# Patient Record
Sex: Male | Born: 1955 | Race: White | Hispanic: No | Marital: Married | Smoking: Current every day smoker
Health system: Southern US, Community
[De-identification: ages and names within clinical notes are randomized; demographics above are authoritative.]

## PROBLEM LIST (undated history)

## (undated) DIAGNOSIS — C801 Malignant (primary) neoplasm, unspecified: Secondary | ICD-10-CM

## (undated) DIAGNOSIS — Z22322 Carrier or suspected carrier of Methicillin resistant Staphylococcus aureus: Secondary | ICD-10-CM

## (undated) DIAGNOSIS — Z973 Presence of spectacles and contact lenses: Secondary | ICD-10-CM

## (undated) DIAGNOSIS — D729 Disorder of white blood cells, unspecified: Secondary | ICD-10-CM

## (undated) DIAGNOSIS — K5090 Crohn's disease, unspecified, without complications: Secondary | ICD-10-CM

## (undated) DIAGNOSIS — B029 Zoster without complications: Secondary | ICD-10-CM

## (undated) DIAGNOSIS — I10 Essential (primary) hypertension: Secondary | ICD-10-CM

## (undated) DIAGNOSIS — K219 Gastro-esophageal reflux disease without esophagitis: Secondary | ICD-10-CM

## (undated) HISTORY — PX: ABDOMINAL SURGERY: SHX537

## (undated) HISTORY — PX: HYDROCELE EXCISION / REPAIR: SUR1145

## (undated) HISTORY — PX: BOWEL RESECTION: SHX1257

## (undated) HISTORY — DX: Disorder of white blood cells, unspecified: D72.9

---

## 2007-04-21 ENCOUNTER — Ambulatory Visit: Payer: Self-pay | Admitting: Gastroenterology

## 2007-09-20 ENCOUNTER — Ambulatory Visit: Payer: Self-pay | Admitting: Family Medicine

## 2007-09-23 ENCOUNTER — Inpatient Hospital Stay: Payer: Self-pay | Admitting: General Surgery

## 2007-10-21 ENCOUNTER — Ambulatory Visit: Payer: Self-pay | Admitting: Unknown Physician Specialty

## 2007-11-02 ENCOUNTER — Ambulatory Visit: Payer: Self-pay | Admitting: Family

## 2007-11-10 ENCOUNTER — Ambulatory Visit: Payer: Self-pay | Admitting: Unknown Physician Specialty

## 2007-12-14 ENCOUNTER — Ambulatory Visit: Payer: Self-pay | Admitting: Unknown Physician Specialty

## 2008-09-20 ENCOUNTER — Ambulatory Visit: Payer: Self-pay | Admitting: Internal Medicine

## 2009-09-23 ENCOUNTER — Ambulatory Visit: Payer: Self-pay | Admitting: Gastroenterology

## 2009-10-02 ENCOUNTER — Inpatient Hospital Stay: Payer: Self-pay | Admitting: Internal Medicine

## 2009-10-14 ENCOUNTER — Inpatient Hospital Stay: Payer: Self-pay | Admitting: Unknown Physician Specialty

## 2009-12-03 ENCOUNTER — Ambulatory Visit: Payer: Self-pay | Admitting: Unknown Physician Specialty

## 2010-01-03 ENCOUNTER — Other Ambulatory Visit: Payer: Self-pay | Admitting: Unknown Physician Specialty

## 2010-04-06 IMAGING — CR DG ABDOMEN 2V
1 series · 3 of 3 positions shown · non-contrast
Comparison: none

REASON FOR EXAM: gastric distention
COMMENTS:

[Series 1: view not recorded · 0.17mm/px · 3 of 3 slices shown]
[im 1/3]
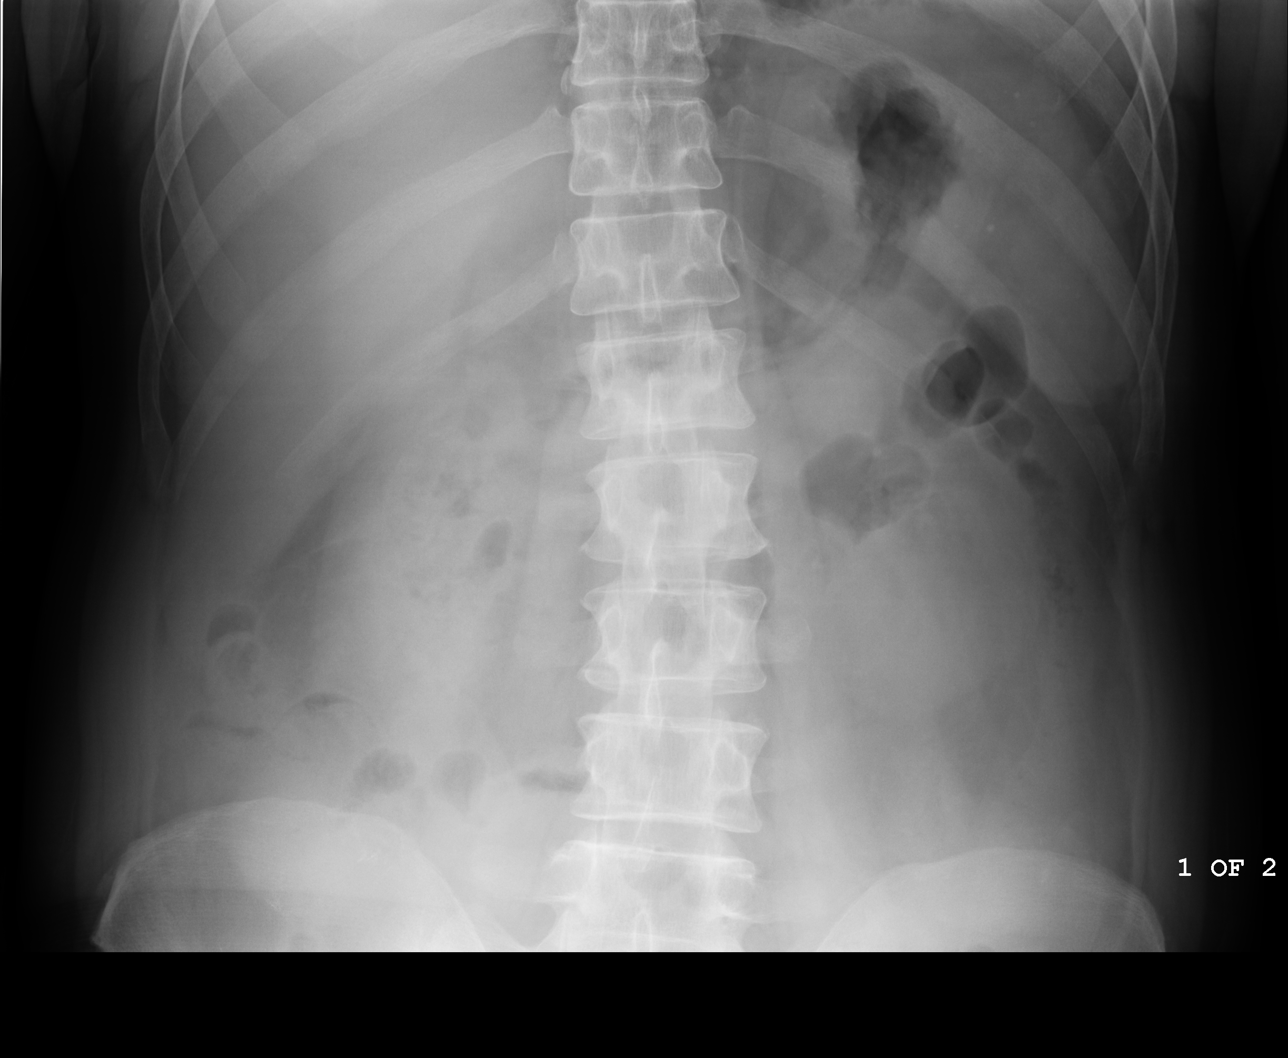
[im 2/3]
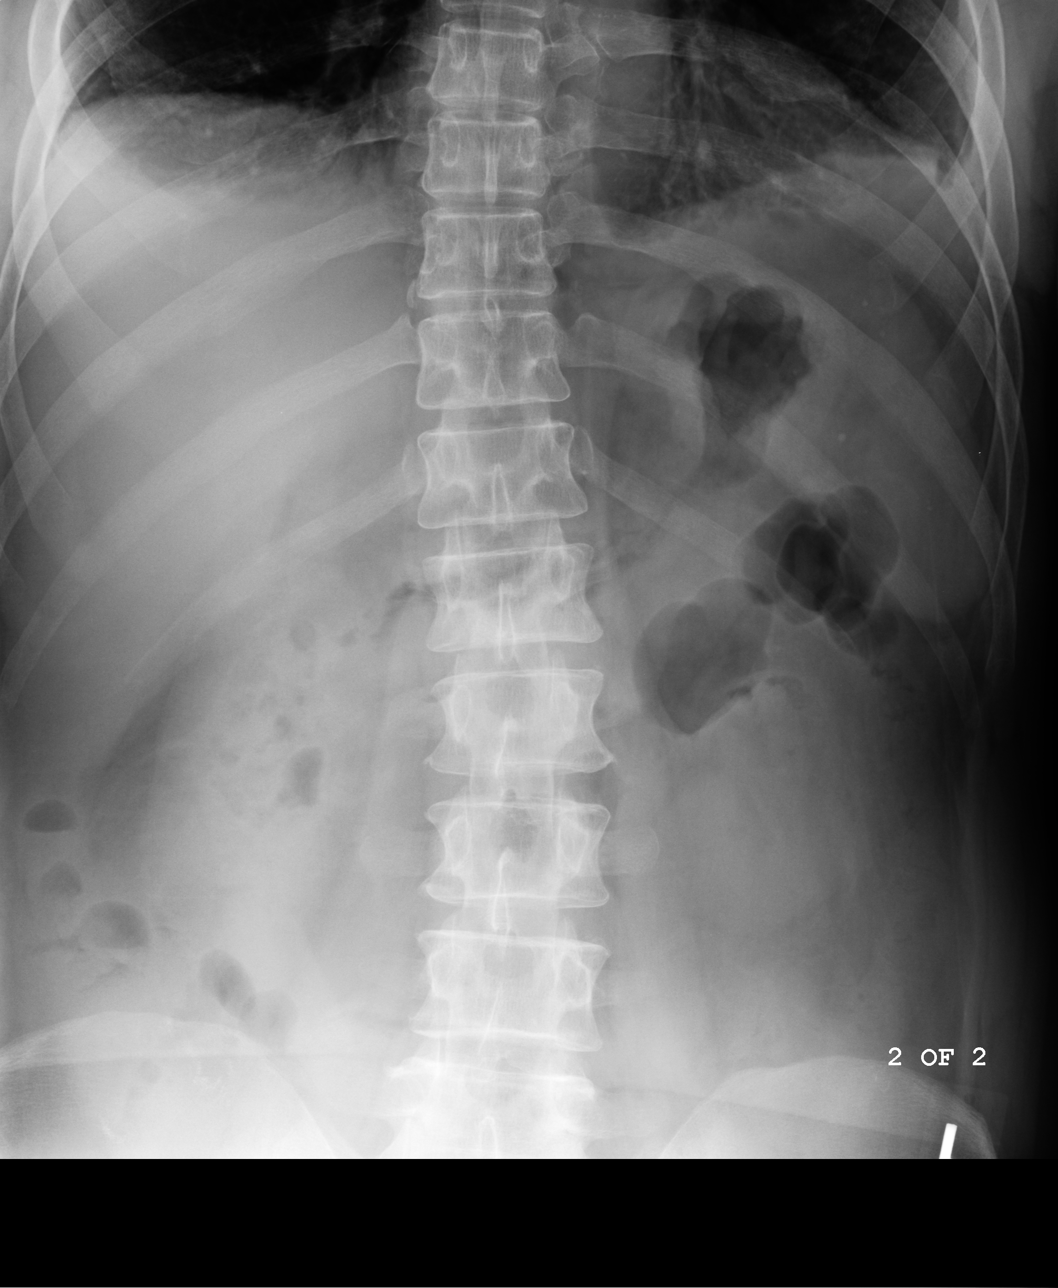
[im 3/3]
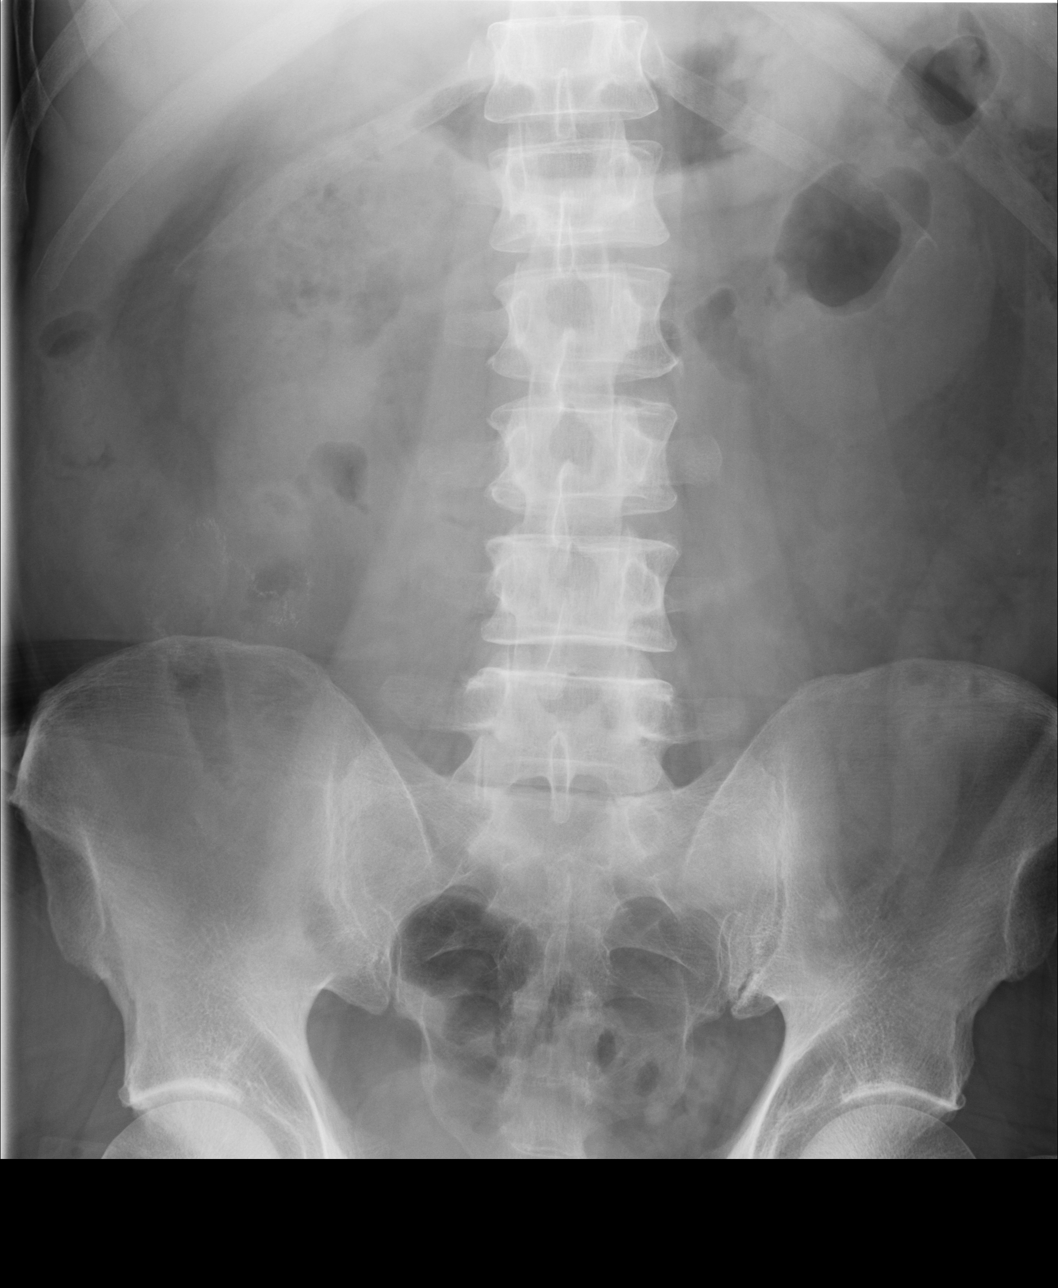

[3 of 3 positions shown; findings below may reference images not displayed]

PROCEDURE:     DXR - DXR ABDOMEN 2 V FLAT AND ERECT  - September 27, 2007 [DATE]

RESULT:     Three views of the abdomen are submitted. The bowel gas pattern
is relatively nonspecific. I see no evidence of obstruction nor of ileus.
Minimal blunting of the LEFT lateral costophrenic angle is seen. No abnormal
soft tissue calcifications over the abdomen are demonstrated. There is
likely a phlebolith in the LEFT aspect of the pelvis. The bony structures
are normal in appearance. The stomach does not appear distended on this
study.
IMPRESSION: 1.The bowel gas pattern is nonspecific. There is no evidence of ileus or
obstruction. The nasogastric tube has been withdrawn.
2. I see no acute abnormality elsewhere within the abdomen. Follow-up
imaging is available if the patient's symptoms warrant this.

## 2010-05-27 ENCOUNTER — Ambulatory Visit: Payer: Self-pay | Admitting: Internal Medicine

## 2011-11-07 ENCOUNTER — Other Ambulatory Visit: Payer: Self-pay

## 2011-12-11 ENCOUNTER — Ambulatory Visit: Payer: Self-pay

## 2012-02-02 ENCOUNTER — Ambulatory Visit: Payer: Self-pay | Admitting: Unknown Physician Specialty

## 2012-12-06 ENCOUNTER — Ambulatory Visit: Payer: Self-pay | Admitting: Unknown Physician Specialty

## 2014-09-18 ENCOUNTER — Other Ambulatory Visit
Admission: RE | Admit: 2014-09-18 | Discharge: 2014-09-18 | Disposition: A | Discharge status: Home / Self Care | Payer: Medicare Other | Attending: Unknown Physician Specialty | Admitting: Unknown Physician Specialty

## 2014-09-18 DIAGNOSIS — R197 Diarrhea, unspecified: Secondary | ICD-10-CM | POA: Insufficient documentation

## 2014-09-18 LAB — C DIFFICILE QUICK SCREEN W PCR REFLEX
C DIFFICILE (CDIFF) TOXIN: NEGATIVE
C Diff antigen: NEGATIVE
C Diff interpretation: NEGATIVE

## 2015-08-30 DIAGNOSIS — K50919 Crohn's disease, unspecified, with unspecified complications: Secondary | ICD-10-CM | POA: Insufficient documentation

## 2016-06-26 ENCOUNTER — Ambulatory Visit
Admission: EM | Admit: 2016-06-26 | Discharge: 2016-06-26 | Disposition: A | Discharge status: Home / Self Care | Payer: Medicare Other | Attending: Family Medicine | Admitting: Family Medicine

## 2016-06-26 ENCOUNTER — Encounter: Payer: Self-pay | Admitting: *Deleted

## 2016-06-26 DIAGNOSIS — J219 Acute bronchiolitis, unspecified: Secondary | ICD-10-CM | POA: Diagnosis not present

## 2016-06-26 DIAGNOSIS — J019 Acute sinusitis, unspecified: Secondary | ICD-10-CM

## 2016-06-26 MED ORDER — CEFUROXIME AXETIL 500 MG PO TABS: 500.0000 mg | ORAL_TABLET | Freq: Two times a day (BID) | ORAL | 0 refills | Status: DC

## 2016-06-26 MED ORDER — PREDNISONE 10 MG (21) PO TBPK: ORAL_TABLET | ORAL | 0 refills | Status: DC

## 2016-06-26 NOTE — ED Triage Notes (Signed)
Seen on Monday by PCP for URI and tx with azithromycin. Now c/o facial pain, headache, nasal drainage, and productive cough- yellow. Is not improving on present meds and PCP is out of town.

## 2016-06-26 NOTE — ED Provider Notes (Signed)
[facility]    CSN: [id number] Arrival date & time: 06/26/16  0900     History   Chief Complaint Chief Complaint  Patient presents with  . Otalgia  . Headache  . Facial Pain  . Cough    HPI MAHMUD KEITHLY is a 61 y.o. male.   Is a 61 year old white male who presents today with a history of nasal congestion sore throat fever and aches last week Thursday. He states wife earlier that week had sore sounds are seen like the flu. He then picked the illness. He states by Saturday and Sunday achiness was improving however he developed a deep cough chest congestion. He saw Dr. Clemmie Krill his PCP on Monday he was negative for the flu at that point but they started him on a Z-Pak for his bronchitis. The tone that they thought he had a secondary infection. Turns out that before he was sick last week he began having sinus congestion and sinus problem for about 2 weeks previously. He states that has gotten a lot worse with pressure on the right side of his face and forehead as well. He's had increased coughing and increased congestion also he has a history of Crohn's disease he takes immune modulator for that. He does smoke and of course his been to 6 smoke as usual amount but tried to explain to him we'll not congratulate him on those points at this time. He is allergic to aspirin. No pertinent past family medical history relevant to today's visit. Surgery he's had abdominal surgery for the past.   The history is provided by the patient. No language interpreter was used.  Otalgia  Location:  Right Quality:  Aching Severity:  Moderate Onset quality:  Sudden Timing:  Constant Chronicity:  New Context: recent URI   Relieved by:  Nothing (zithromax) Worsened by:  Nothing Associated symptoms: cough and headaches   Headache  Associated symptoms: cough, ear pain and sinus pressure   Cough  Associated symptoms: ear pain and headaches     History reviewed. No pertinent past medical  history.  There are no active problems to display for this patient.   Past Surgical History:  Procedure Laterality Date  . ABDOMINAL SURGERY         Home Medications    Prior to Admission medications   Medication Sig Start Date End Date Taking? Authorizing Provider  Adalimumab (HUMIRA PEN [place]) Inject into the skin.   Yes Historical Provider, MD  cefUROXime (CEFTIN) 500 MG tablet Take 1 tablet (500 mg total) by mouth 2 (two) times daily. 06/26/16   Frederich Cha, MD  predniSONE (STERAPRED UNI-PAK 21 TAB) 10 MG (21) TBPK tablet Sig 6 tablet day 1, 5 tablets day 2, 4 tablets day 3,,3tablets day 4, 2 tablets day 5, 1 tablet day 6 take all tablets orally 06/26/16   Frederich Cha, MD    Family History History reviewed. No pertinent family history.  Social History Social History  Substance Use Topics  . Smoking status: Current Every Day Smoker  . Smokeless tobacco: Never Used  . Alcohol use No     Allergies   Aspirin   Review of Systems Review of Systems  HENT: Positive for ear pain, facial swelling, sinus pain and sinus pressure.   Respiratory: Positive for cough.   Neurological: Positive for headaches.  All other systems reviewed and are negative.    Physical Exam Triage Vital Signs ED Triage Vitals  Enc Vitals Group  BP 06/26/16 0935 (!) 136/91     Pulse Rate 06/26/16 0935 88     Resp 06/26/16 0935 16     Temp 06/26/16 0935 98.4 F (36.9 C)     Temp Source 06/26/16 0935 Oral     SpO2 06/26/16 0935 99 %     Weight 06/26/16 0937 179 lb (81.2 kg)     Height 06/26/16 0937 5' 11"  (1.803 m)     Head Circumference --      Peak Flow --      Pain Score --      Pain Loc --      Pain Edu? --      Excl. in [place]? --    No data found.   Updated Vital Signs BP (!) 136/91 (BP Location: Left Arm)   Pulse 88   Temp 98.4 F (36.9 C) (Oral)   Resp 16   Ht 5' 11"  (1.803 m)   Wt 179 lb (81.2 kg)   SpO2 99%   BMI 24.97 kg/m   Visual Acuity Right Eye Distance:   Left  Eye Distance:   Bilateral Distance:    Right Eye Near:   Left Eye Near:    Bilateral Near:     Physical Exam  Constitutional: He appears well-developed and well-nourished.  HENT:  Head: Normocephalic and atraumatic.  Right Ear: Hearing, tympanic membrane, external ear and ear canal normal.  Left Ear: Hearing, tympanic membrane, external ear and ear canal normal.  Nose: Mucosal edema and rhinorrhea present. Right sinus exhibits maxillary sinus tenderness and frontal sinus tenderness.    Mouth/Throat: Uvula is midline, oropharynx is clear and moist and mucous membranes are normal.  Eyes: Conjunctivae are normal. Pupils are equal, round, and reactive to light.  Neck: Normal range of motion. Neck supple.  Cardiovascular: Normal rate and regular rhythm.   Pulmonary/Chest: Effort normal and breath sounds normal.  Musculoskeletal: Normal range of motion.  Neurological: He is alert.  Skin: Skin is warm.  Vitals reviewed.    UC Treatments / Results  Labs (all labs ordered are listed, but only abnormal results are displayed) Labs Reviewed - No data to display  EKG  EKG Interpretation None       Radiology No results found.  Procedures Procedures (including critical care time)  Medications Ordered in UC Medications - No data to display   Initial Impression / Assessment and Plan / UC Course  I have reviewed the triage vital signs and the nursing notes.  Pertinent labs & imaging results that were available during my care of the patient were reviewed by me and considered in my medical decision making (see chart for details).    Patient that his 62-year-old white male who has or is taking immune modulating drugs and is not giving himself his injection because he is on antibiotics. Who apparently had nasal congestion for about 1 or 2 weeks before it sounds like he was exposed and developed flu. He developed acute bronchitis top of the flu. Zithromax seemed to help that. Does not  help the sinus infection nothing was there before he had the flu. We'll place him on Ceftin 500 mg 1 tablet twice a day course of prednisone and hopefully this will make things better. Follow-up Dr. Clemmie Krill next week if not better.  Final Clinical Impressions(s) / UC Diagnoses   Final diagnoses:  Acute non-recurrent sinusitis, unspecified location  Acute bronchiolitis due to unspecified organism    New Prescriptions Discharge Medication List as  of 06/26/2016 10:33 AM    START taking these medications   Details  cefUROXime (CEFTIN) 500 MG tablet Take 1 tablet (500 mg total) by mouth 2 (two) times daily., Starting Thu 06/26/2016, Normal    predniSONE (STERAPRED UNI-PAK 21 TAB) 10 MG (21) TBPK tablet Sig 6 tablet day 1, 5 tablets day 2, 4 tablets day 3,,3tablets day 4, 2 tablets day 5, 1 tablet day 6 take all tablets orally, Normal         Note: This dictation was prepared with Dragon dictation along with smaller phrase technology. Any transcriptional errors that result from this process are unintentional.   Frederich Cha, MD 06/26/16 1122

## 2016-06-30 ENCOUNTER — Inpatient Hospital Stay
Admission: EM | Admit: 2016-06-30 | Discharge: 2016-07-02 | DRG: 153 | Disposition: A | Discharge status: Home / Self Care | Payer: Medicare Other | Attending: Specialist | Admitting: Specialist

## 2016-06-30 ENCOUNTER — Emergency Department: Payer: Medicare Other

## 2016-06-30 ENCOUNTER — Encounter: Payer: Self-pay | Admitting: Emergency Medicine

## 2016-06-30 DIAGNOSIS — B9689 Other specified bacterial agents as the cause of diseases classified elsewhere: Secondary | ICD-10-CM | POA: Diagnosis present

## 2016-06-30 DIAGNOSIS — K5090 Crohn's disease, unspecified, without complications: Secondary | ICD-10-CM | POA: Diagnosis present

## 2016-06-30 DIAGNOSIS — Z886 Allergy status to analgesic agent status: Secondary | ICD-10-CM

## 2016-06-30 DIAGNOSIS — H578 Other specified disorders of eye and adnexa: Secondary | ICD-10-CM | POA: Diagnosis present

## 2016-06-30 DIAGNOSIS — D72829 Elevated white blood cell count, unspecified: Secondary | ICD-10-CM

## 2016-06-30 DIAGNOSIS — Z85828 Personal history of other malignant neoplasm of skin: Secondary | ICD-10-CM

## 2016-06-30 DIAGNOSIS — Z79899 Other long term (current) drug therapy: Secondary | ICD-10-CM | POA: Diagnosis not present

## 2016-06-30 DIAGNOSIS — B370 Candidal stomatitis: Secondary | ICD-10-CM | POA: Diagnosis present

## 2016-06-30 DIAGNOSIS — J019 Acute sinusitis, unspecified: Secondary | ICD-10-CM | POA: Diagnosis present

## 2016-06-30 DIAGNOSIS — I10 Essential (primary) hypertension: Secondary | ICD-10-CM | POA: Diagnosis present

## 2016-06-30 DIAGNOSIS — F17210 Nicotine dependence, cigarettes, uncomplicated: Secondary | ICD-10-CM | POA: Diagnosis present

## 2016-06-30 DIAGNOSIS — H5789 Other specified disorders of eye and adnexa: Secondary | ICD-10-CM | POA: Diagnosis present

## 2016-06-30 HISTORY — DX: Essential (primary) hypertension: I10

## 2016-06-30 HISTORY — DX: Malignant (primary) neoplasm, unspecified: C80.1

## 2016-06-30 HISTORY — DX: Crohn's disease, unspecified, without complications: K50.90

## 2016-06-30 LAB — CBC WITH DIFFERENTIAL/PLATELET
BASOS PCT: 1 %
Basophils Absolute: 0.2 10*3/uL — ABNORMAL HIGH (ref 0–0.1)
EOS PCT: 1 %
Eosinophils Absolute: 0.2 10*3/uL (ref 0–0.7)
HCT: 49.1 % (ref 40.0–52.0)
HEMOGLOBIN: 17.1 g/dL (ref 13.0–18.0)
Lymphocytes Relative: 10 %
Lymphs Abs: 2 10*3/uL (ref 1.0–3.6)
MCH: 30.7 pg (ref 26.0–34.0)
MCHC: 34.7 g/dL (ref 32.0–36.0)
MCV: 88.4 fL (ref 80.0–100.0)
Monocytes Absolute: 3.2 10*3/uL — ABNORMAL HIGH (ref 0.2–1.0)
Monocytes Relative: 16 %
NEUTROS PCT: 72 %
Neutro Abs: 14.2 10*3/uL — ABNORMAL HIGH (ref 1.4–6.5)
Platelets: 365 10*3/uL (ref 150–440)
RBC: 5.56 MIL/uL (ref 4.40–5.90)
RDW: 13.5 % (ref 11.5–14.5)
WBC: 19.8 10*3/uL — ABNORMAL HIGH (ref 3.8–10.6)

## 2016-06-30 LAB — BASIC METABOLIC PANEL
Anion gap: 9 (ref 5–15)
BUN: 11 mg/dL (ref 6–20)
CHLORIDE: 101 mmol/L (ref 101–111)
CO2: 28 mmol/L (ref 22–32)
Calcium: 9.1 mg/dL (ref 8.9–10.3)
Creatinine, Ser: 0.8 mg/dL (ref 0.61–1.24)
GFR calc non Af Amer: >60 mL/min (ref >60)
Glucose, Bld: 109 mg/dL — ABNORMAL HIGH (ref 65–99)
Potassium: 4 mmol/L (ref 3.5–5.1)
Sodium: 138 mmol/L (ref 135–145)

## 2016-06-30 MED ORDER — TRAMADOL HCL 50 MG PO TABS
50.0000 mg | ORAL_TABLET | Freq: Four times a day (QID) | ORAL | Status: DC | PRN
  Administered 2016-06-30: 50 mg via ORAL
  Filled 2016-06-30: qty 1

## 2016-06-30 MED ORDER — SODIUM CHLORIDE 0.9 % IV SOLN
Freq: Once | INTRAVENOUS | Status: AC
  Administered 2016-06-30: 19:00:00 via INTRAVENOUS

## 2016-06-30 MED ORDER — BISACODYL 5 MG PO TBEC: 5.0000 mg | DELAYED_RELEASE_TABLET | Freq: Every day | ORAL | Status: DC | PRN

## 2016-06-30 MED ORDER — KETOROLAC TROMETHAMINE 30 MG/ML IJ SOLN
30.0000 mg | Freq: Once | INTRAMUSCULAR | Status: AC
  Administered 2016-06-30: 30 mg via INTRAVENOUS
  Filled 2016-06-30: qty 1

## 2016-06-30 MED ORDER — CLINDAMYCIN PHOSPHATE 600 MG/50ML IV SOLN
600.0000 mg | Freq: Three times a day (TID) | INTRAVENOUS | Status: DC
  Administered 2016-06-30 – 2016-07-01 (×3): 600 mg via INTRAVENOUS
  Filled 2016-06-30 (×6): qty 50

## 2016-06-30 MED ORDER — PROMETHAZINE HCL 25 MG PO TABS
25.0000 mg | ORAL_TABLET | Freq: Four times a day (QID) | ORAL | Status: DC | PRN
  Administered 2016-07-01: 25 mg via ORAL
  Filled 2016-06-30: qty 1

## 2016-06-30 MED ORDER — MAGIC MOUTHWASH
5.0000 mL | Freq: Four times a day (QID) | ORAL | Status: DC
  Administered 2016-06-30 – 2016-07-02 (×5): 5 mL via ORAL
  Filled 2016-06-30 (×5): qty 10

## 2016-06-30 MED ORDER — ACETAMINOPHEN 325 MG PO TABS: 650.0000 mg | ORAL_TABLET | Freq: Four times a day (QID) | ORAL | Status: DC | PRN

## 2016-06-30 MED ORDER — LISINOPRIL 10 MG PO TABS
10.0000 mg | ORAL_TABLET | Freq: Every day | ORAL | Status: DC
  Administered 2016-07-01 – 2016-07-02 (×2): 10 mg via ORAL
  Filled 2016-06-30 (×2): qty 1

## 2016-06-30 MED ORDER — ACETAMINOPHEN 650 MG RE SUPP: 650.0000 mg | Freq: Four times a day (QID) | RECTAL | Status: DC | PRN

## 2016-06-30 MED ORDER — VITAMIN D (ERGOCALCIFEROL) 1.25 MG (50000 UNIT) PO CAPS: 50000.0000 [IU] | ORAL_CAPSULE | ORAL | Status: DC

## 2016-06-30 MED ORDER — AMLODIPINE BESYLATE 10 MG PO TABS
10.0000 mg | ORAL_TABLET | Freq: Every day | ORAL | Status: DC
Start: 2016-07-01 — End: 2016-07-02
  Administered 2016-07-01 – 2016-07-02 (×2): 10 mg via ORAL
  Filled 2016-06-30 (×2): qty 1

## 2016-06-30 MED ORDER — MAGIC MOUTHWASH W/LIDOCAINE
5.0000 mL | Freq: Four times a day (QID) | ORAL | Status: DC
  Administered 2016-06-30: 5 mL via ORAL
  Filled 2016-06-30 (×3): qty 5

## 2016-06-30 MED ORDER — SENNOSIDES-DOCUSATE SODIUM 8.6-50 MG PO TABS: 1.0000 | ORAL_TABLET | Freq: Every evening | ORAL | Status: DC | PRN

## 2016-06-30 MED ORDER — ENOXAPARIN SODIUM 40 MG/0.4ML ~~LOC~~ SOLN
40.0000 mg | SUBCUTANEOUS | Status: DC
  Administered 2016-06-30 – 2016-07-01 (×2): 40 mg via SUBCUTANEOUS
  Filled 2016-06-30 (×2): qty 0.4

## 2016-06-30 MED ORDER — SODIUM CHLORIDE 0.9 % IV BOLUS (SEPSIS)
1000.0000 mL | Freq: Once | INTRAVENOUS | Status: AC
  Administered 2016-06-30: 1000 mL via INTRAVENOUS

## 2016-06-30 MED ORDER — ALBUTEROL SULFATE (2.5 MG/3ML) 0.083% IN NEBU: 2.5000 mg | INHALATION_SOLUTION | Freq: Four times a day (QID) | RESPIRATORY_TRACT | Status: DC | PRN

## 2016-06-30 MED ORDER — LORATADINE 10 MG PO TABS: 10.0000 mg | ORAL_TABLET | Freq: Every day | ORAL | Status: DC | PRN

## 2016-06-30 MED ORDER — HYOSCYAMINE SULFATE 0.125 MG PO TABS
0.1250 mg | ORAL_TABLET | ORAL | Status: DC | PRN
  Filled 2016-06-30: qty 1

## 2016-06-30 MED ORDER — IOPAMIDOL (ISOVUE-300) INJECTION 61%
75.0000 mL | Freq: Once | INTRAVENOUS | Status: AC | PRN
  Administered 2016-06-30: 75 mL via INTRAVENOUS
  Filled 2016-06-30: qty 75

## 2016-06-30 MED ORDER — CLINDAMYCIN PHOSPHATE 600 MG/50ML IV SOLN
600.0000 mg | Freq: Once | INTRAVENOUS | Status: AC
  Administered 2016-06-30: 600 mg via INTRAVENOUS
  Filled 2016-06-30: qty 50

## 2016-06-30 NOTE — ED Notes (Addendum)
See triage note  States he was seen last Thursday at [facility] urgent care and placed on ceftin and pred taper for sinus problems  Right eye and right side of face swollen  Per family swelling is worse today was sent in by PCP

## 2016-06-30 NOTE — ED Provider Notes (Signed)
[facility] Emergency Department Provider Note ____________________________________________  Time seen: 1315  I have reviewed the triage vital signs and the nursing notes.  HISTORY  Chief Complaint   Eye Drainage and Facial Pain  HPI Brent Diaz is a 61 y.o. male presents to the ED accompanied by his wife, for evaluation of worsening symptoms associated with acute sinusitis. The patient presents directly from his PCPs office for evaluation. Dr. Clemmie Krill evaluated the patient this morning for his complaint of continued right sided sinus and nasal congestion. He describes increased right-sided nasal pressure/congestion, mouth ulcers, and swelling around the right eyelid. He denies any interim fevers, chills, or sweats. He denies any eye pain, vision change, or vertigo. Patient is currently on a prednisone taper as well as Ceftin which was prescribed 4 days prior by Dr. Alveta Heimlich. Prior to that the patient had completed a Z-pack given by Dr. Clemmie Krill for suspected post-viral bronchitis, from last Monday. He reported to Dr. Alveta Heimlich that the sinus symptoms had been persistent for 2 weeks prior to his eval.   Past Medical History:  Diagnosis Date  . Cancer ([place])    skin  . Hypertension     There are no active problems to display for this patient.   Past Surgical History:  Procedure Laterality Date  . ABDOMINAL SURGERY      Prior to Admission medications   Medication Sig Start Date End Date Taking? Authorizing Provider  Adalimumab (HUMIRA PEN [place]) Inject into the skin.    Historical Provider, MD  cefUROXime (CEFTIN) 500 MG tablet Take 1 tablet (500 mg total) by mouth 2 (two) times daily. 06/26/16   Frederich Cha, MD  predniSONE (STERAPRED UNI-PAK 21 TAB) 10 MG (21) TBPK tablet Sig 6 tablet day 1, 5 tablets day 2, 4 tablets day 3,,3tablets day 4, 2 tablets day 5, 1 tablet day 6 take all tablets orally 06/26/16   Frederich Cha, MD    Allergies Aspirin  No family history on  file.  Social History Social History  Substance Use Topics  . Smoking status: Current Every Day Smoker    Packs/day: 0.50    Types: Cigarettes  . Smokeless tobacco: Never Used  . Alcohol use No    Review of Systems  Constitutional: Negative for fever. Eyes: Negative for visual changes. ENT: Negative for sore throat. Cardiovascular: Negative for chest pain. Respiratory: Negative for shortness of breath. Gastrointestinal: Negative for abdominal pain, vomiting and diarrhea. Genitourinary: Negative for dysuria. Musculoskeletal: Negative for back pain. Skin: Negative for rash. Neurological: Negative for headaches, focal weakness or numbness. ____________________________________________  PHYSICAL EXAM:  VITAL SIGNS: ED Triage Vitals  Enc Vitals Group     BP 06/30/16 1241 (!) 148/91     Pulse Rate 06/30/16 1241 86     Resp 06/30/16 1241 20     Temp 06/30/16 1241 97.8 F (36.6 C)     Temp Source 06/30/16 1241 Oral     SpO2 06/30/16 1241 98 %     Weight 06/30/16 1242 172 lb (78 kg)     Height 06/30/16 1242 5' 10"  (1.778 m)     Head Circumference --      Peak Flow --      Pain Score 06/30/16 1242 10     Pain Loc --      Pain Edu? --      Excl. in [place]? --     Constitutional: Alert and oriented. Well appearing and in no distress. Head: Normocephalic and atraumatic.  Eyes: Conjunctivae are normal. PERRL. Normal extraocular movements. Right periorbital swelling and mucopurulent eye drainage. Upper lid blepharitis noted. Minimally tender to palp over the infraorbital region at inferior orbital rim. Ears: Canals clear. TMs intact bilaterally. Nose: No congestion/rhinorrhea/epistaxis. Right-sided sinus congestion and pain Mouth/Throat: Mucous membranes are moist. Neck: Supple. No thyromegaly. Hematological/Lymphatic/Immunological: No cervical lymphadenopathy. Cardiovascular: Normal rate, regular rhythm. Normal distal pulses. Respiratory: Normal respiratory effort. No  wheezes/rales/rhonchi. Musculoskeletal: Nontender with normal range of motion in all extremities.  Neurologic:  Normal gait without ataxia. Normal speech and language. No gross focal neurologic deficits are appreciated. Skin:  Skin is warm, dry and intact. No rash noted. ____________________________________________   LABS (pertinent positives/negatives) Labs Reviewed  CBC WITH DIFFERENTIAL/PLATELET - Abnormal; Notable for the following:       Result Value   WBC 19.8 (*)    Neutro Abs 14.2 (*)    Monocytes Absolute 3.2 (*)    Basophils Absolute 0.2 (*)    All other components within normal limits  BASIC METABOLIC PANEL - Abnormal; Notable for the following:    Glucose, Bld 109 (*)    All other components within normal limits  ____________________________________________   RADIOLOGY  CT Maxillofacial w/ CM  IMPRESSION: Right periorbital soft tissue swelling.  No underlying abscess.  No acute bone abnormality.  No sinus disease. ____________________________________________  PROCEDURES  NS 1000 ml IV bolus Toradol 30 mg IVP Clindamycin 600 mg IVPB ____________________________________________  INITIAL IMPRESSION / ASSESSMENT AND PLAN / ED COURSE  ----------------------------------------- 3:39 PM on 06/30/2016 -----------------------------------------  Discussed case with my attending, Dr. Burlene Arnt. Patient with an acute bacterial sinusitis, elevated white count, and what appears to be filled outpatient antibiotic course. He agrees with plan to hang clindamycin and consult ENT for admission and inpatient management.   Patient and wife notified of plan of care, and agree with admission. Patient reports decreased pain at this time.   ----------------------------------------- 3:51 PM on 06/30/2016 -----------------------------------------  Page to Dr. Ayesha Mohair:  ----------------------------------------- 4:24 PM on  06/30/2016 -----------------------------------------  Hospitalist consulted for admission for inpatient IV antibiotic therapy. ____________________________________________  FINAL CLINICAL IMPRESSION(S) / ED DIAGNOSES  Final diagnoses:  Acute bacterial sinusitis      Melvenia Needles, PA-C 06/30/16 [place], PA-C 06/30/16 1624    Schuyler Amor, MD 06/30/16 1735

## 2016-06-30 NOTE — ED Triage Notes (Signed)
R periorbital swelling and drainage x 3 days. Sinus congestion x 1 week. Currently on antibiotic for sinus infection.

## 2016-06-30 NOTE — H&P (Signed)
[place] [facility] [place] NAME: Brent Diaz    MR#:  [id number]  DATE OF BIRTH:  18-Feb-1956  DATE OF ADMISSION:  06/30/2016  PRIMARY CARE PHYSICIAN: Pcp Not In System   REQUESTING/REFERRING PHYSICIAN: Dr Jimmye Norman  CHIEF COMPLAINT:   Right-sided eye pain with sinus congestion and ear ache for 10-12 days HISTORY OF PRESENT ILLNESS:  Brent Diaz  is a 61 y.o. male with a known history ofCrohn's disease, hypertension, history of skin cancer comes to the emergency room after patient started having increasing swelling around his eyes with watery eyes and sinus congestion with tightness on the right side of his face. Patient has significant headache and low-grade fever and found to have elevated white count of 19,000.  He was recently seen at urgent care and underwent a course of Z-Pak for cough congestion and cough. He was then put on prednisone taper 21 day pill along with Ceftin for sinusitis and right-sided pain around the cheek. Patient continued to have symptoms and now had swelling around his eye for last 3 days came to the emergency room for further evaluation management   CT maxillofacial showed right periorbital swelling   Patient received IV clindamycin is being admitted for further evaluation and management  PAST MEDICAL HISTORY:   Past Medical History:  Diagnosis Date  . Cancer ([place])    skin  . Hypertension     PAST SURGICAL HISTOIRY:   Past Surgical History:  Procedure Laterality Date  . ABDOMINAL SURGERY      SOCIAL HISTORY:   Social History  Substance Use Topics  . Smoking status: Current Every Day Smoker    Packs/day: 0.50    Types: Cigarettes  . Smokeless tobacco: Never Used  . Alcohol use No    FAMILY HISTORY:  No family history on file.  DRUG ALLERGIES:   Allergies  Allergen Reactions  . Aspirin Other (See Comments)    Peptic ulcers    REVIEW OF SYSTEMS:  Review of Systems  Constitutional:  Negative for chills, fever and weight loss.  HENT: Negative for ear discharge, ear pain and nosebleeds.   Eyes: Negative for blurred vision, pain and discharge.  Respiratory: Negative for sputum production, shortness of breath, wheezing and stridor.   Cardiovascular: Negative for chest pain, palpitations, orthopnea and PND.  Gastrointestinal: Negative for abdominal pain, diarrhea, nausea and vomiting.  Genitourinary: Negative for frequency and urgency.  Musculoskeletal: Negative for back pain and joint pain.  Neurological: Positive for weakness. Negative for sensory change, speech change and focal weakness.  Psychiatric/Behavioral: Negative for depression and hallucinations. The patient is not nervous/anxious.      MEDICATIONS AT HOME:   Prior to Admission medications   Medication Sig Start Date End Date Taking? Authorizing Provider  albuterol (PROVENTIL HFA;VENTOLIN HFA) 108 (90 Base) MCG/ACT inhaler Inhale 2 puffs into the lungs every 6 (six) hours as needed for wheezing or shortness of breath.   Yes Historical Provider, MD  amLODipine (NORVASC) 10 MG tablet Take 10 mg by mouth daily.   Yes Historical Provider, MD  hyoscyamine (LEVSIN, ANASPAZ) 0.125 MG tablet Take 0.125 mg by mouth every 4 (four) hours as needed.   Yes Historical Provider, MD  loratadine (CLARITIN) 10 MG tablet Take 10 mg by mouth daily.   Yes Historical Provider, MD  pantoprazole (PROTONIX) 40 MG tablet Take 40 mg by mouth 2 (two) times daily. 04/14/16  Yes Historical Provider, MD  promethazine (PHENERGAN) 25 MG tablet Take  25 mg by mouth every 6 (six) hours as needed for nausea or vomiting.   Yes Historical Provider, MD  Vitamin D, Ergocalciferol, (DRISDOL) 50000 units CAPS capsule Take 50,000 Units by mouth every 7 (seven) days.   Yes Historical Provider, MD  zolpidem (AMBIEN) 10 MG tablet Take 10 mg by mouth at bedtime as needed for sleep.   Yes Historical Provider, MD  Adalimumab (HUMIRA PEN [place]) Inject into the skin.     Historical Provider, MD  cefUROXime (CEFTIN) 500 MG tablet Take 1 tablet (500 mg total) by mouth 2 (two) times daily. 06/26/16   Frederich Cha, MD  folic acid (FOLVITE) 1 MG tablet Take 1 mg by mouth daily. 06/21/16   Historical Provider, MD  predniSONE (STERAPRED UNI-PAK 21 TAB) 10 MG (21) TBPK tablet Sig 6 tablet day 1, 5 tablets day 2, 4 tablets day 3,,3tablets day 4, 2 tablets day 5, 1 tablet day 6 take all tablets orally 06/26/16   Frederich Cha, MD      VITAL SIGNS:  Blood pressure (!) 155/81, pulse 65, temperature 98.7 F (37.1 C), temperature source Oral, resp. rate 20, height 5' 10"  (1.778 m), weight 78 kg (172 lb), SpO2 97 %.  PHYSICAL EXAMINATION:  GENERAL:  61 y.o.-year-old patient lying in the bed with no acute distress.  EYES: Pupils equal, round, reactive to light and accommodation. No scleral icterus. Extraocular muscles intact. Right periorbital swelling with watery eyes. No significant cellulitis or crusting or discharge from the eye noted. Patient's vision is normal.  HEENT: Head atraumatic, normocephalic. Oropharynx and nasopharynx clear. Some tenderness over the right maxillary sinus.  NECK:  Supple, no jugular venous distention. No thyroid enlargement, no tenderness.  LUNGS: Normal breath sounds bilaterally, no wheezing, rales,rhonchi or crepitation. No use of accessory muscles of respiration.  CARDIOVASCULAR: S1, S2 normal. No murmurs, rubs, or gallops.  ABDOMEN: Soft, nontender, nondistended. Bowel sounds present. No organomegaly or mass.  EXTREMITIES: No pedal edema, cyanosis, or clubbing.  NEUROLOGIC: Cranial nerves II through XII are intact. Muscle strength 5/5 in all extremities. Sensation intact. Gait not checked.  PSYCHIATRIC: The patient is alert and oriented x 3.  SKIN: No obvious rash, lesion, or ulcer.   LABORATORY PANEL:   CBC  Recent Labs Lab 06/30/16 1344  WBC 19.8*  HGB 17.1  HCT 49.1  PLT 365    ------------------------------------------------------------------------------------------------------------------  Chemistries   Recent Labs Lab 06/30/16 1344  NA 138  K 4.0  CL 101  CO2 28  GLUCOSE 109*  BUN 11  CREATININE 0.80  CALCIUM 9.1   ------------------------------------------------------------------------------------------------------------------  Cardiac Enzymes No results for input(s): TROPONINI in the last 168 hours. ------------------------------------------------------------------------------------------------------------------  RADIOLOGY:  Ct Maxillofacial W Contrast  Result Date: 06/30/2016 CLINICAL DATA:  61 year old with right orbital swelling for 3 days. Patient is on antibiotics. Right maxilla pressure. EXAM: CT MAXILLOFACIAL WITH CONTRAST TECHNIQUE: Multidetector CT imaging of the maxillofacial structures was performed. Multiplanar CT image reconstructions were also generated. A small metallic BB was placed on the right temple in order to reliably differentiate right from left. COMPARISON:  CT sinus exam 10/13/2009 FINDINGS: Osseous: Negative for a facial bone fracture. Mandibular condyles are located. No suspicious dental disease. There are prominent bone structures along the mucosal surface of the mandible bilaterally. 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 No suspicious abnormalities in cervical spine. Mild disc space narrowing at C4-C5. Orbits: Soft tissue swelling in the right periorbital region, particularly in the infraorbital region. Normal appearance of both globes. Normal appearance  of the retrobulbar tissues. Sinuses: Paranasal sinuses are clear. Maxillary sinuses are aerated. Soft tissues: Submandibular glands are symmetrical. Small nodules in bilateral parotid glands probably represent intra parotid lymph nodes. Small lymph nodes on both sides of the neck. No evidence for a soft tissue abscess. Limited intracranial: No significant or  unexpected finding. IMPRESSION: Right periorbital soft tissue swelling.  No underlying abscess. No acute bone abnormality. No sinus disease. Electronically Signed   By: Markus Daft M.D.   On: 06/30/2016 15:23    EKG:    IMPRESSION AND PLAN:   Brent Diaz  is a 61 y.o. male with a known history ofCrohn's disease, hypertension, history of skin cancer comes to the emergency room after patient started having increasing swelling around his eyes with watery eyes and sinus congestion with tightness on the right side of his face. Patient has significant headache and low-grade fever and found to have elevated white count of 19,000.   1. Right-sided sinusitis, acute suspected maxillary with periorbital swelling -Patient presented with right-sided facial tightness with ear ache headache and elevated white count of 19,000 -IV clindamycin -ENT consultation spoke with Dr.Juengel -Await further recommendations from ENT -Follow up white count  2. Oral thrush/oral ulcers -Nystatin swish and swallow and Magic mouthwash  3. Hypertension continue home meds  4. History of Crohn's Patient follows Dr. Vira Agar  5. DVT prophylaxis Lovenox  All the records are reviewed and case discussed with ED provider. Management plans discussed with the patient, family and they are in agreement.  CODE STATUS: FULL  TOTAL TIME TAKING CARE OF THIS PATIENT: 50 minutes.    Dylann Layne M.D on 06/30/2016 at 4:58 PM  Between 7am to 6pm - Pager - [phone]  After 6pm go to [email or web address] - password [email or web address] [facility]  [facility]  Office  [phone]  CC: Primary care physician; Pcp Not In System

## 2016-06-30 NOTE — Consult Note (Signed)
Bleu, Minerd [id number] 26-Mar-1956 Fritzi Mandes, MD  Reason for Consult: Evaluate for right orbital cellulitis  HPI: The patient is a 61 year old white male history of rhinovirus approximately 3 weeks ago lots of running from his nose and congestion. It seems slowly settle down go away. Last week he is complaining more of pressure and tightness the right side of the face and the side of his nose and under his right eye. He is not getting anything out of his nose but he had persistent headache low-grade fever. He had been on Z-Pak previously with a cough after the rhinovirus. He is seen in the urgent care and was started on Ceftin and a prednisone taper over 6 days. Last 3 days started having some watery swelling around his right eye and get a lot of tearing. He has not affected his vision or his ability to move his side. He still does not complain of any nasal drainage or postnasal drainage.  Allergies:  Allergies  Allergen Reactions  . Aspirin Other (See Comments)    Peptic ulcers    ROS: Review of systems normal other than 12 systems except per HPI.  PMH:  Past Medical History:  Diagnosis Date  . Cancer ([place])    skin  . Crohn's disease ([place])   . Hypertension     FH: History reviewed. No pertinent family history.  SH:  Social History   Social History  . Marital status: Married    Spouse name: N/A  . Number of children: N/A  . Years of education: N/A   Occupational History  . Not on file.   Social History Main Topics  . Smoking status: Current Every Day Smoker    Packs/day: 0.50    Types: Cigarettes  . Smokeless tobacco: Never Used  . Alcohol use No  . Drug use: Unknown  . Sexual activity: Not on file   Other Topics Concern  . Not on file   Social History Narrative  . No narrative on file    PSH:  Past Surgical History:  Procedure Laterality Date  . ABDOMINAL SURGERY    . BOWEL RESECTION      Physical  Exam: Well-developed and well-nourished white male in  no acute distress. CN 2-12 grossly intact and symmetric. He does have some swelling of his right lower lid weren't slightly pink. There is no exudate inside the lids, nor is there any scleral redness or obvious conjunctivitis. Slight puffiness alone is right upper cheek and right upper lid but no skin cellulitis. Given lesions or evidence of skin break.  Skin warm and dry. Nasal cavity without polyps or purulence. External nose and ears without masses or lesions. EOMI.  Neck supple with no masses or lesions. No lymphadenopathy palpated.  I reviewed the CT scan of the sinuses and orbits in detail. There is no evidence of any mucosal membrane thickening in the maxillary, ethmoid, or frontal sinuses. The sinuses are totally clear. There is slight edema of the periorbital fat and lids and possibly some swelling of the tear sac.   A/P: There Is some right periorbital swelling and lots of tearing. No evidence of purulence. The nose is open and the sinuses are totally clear, so this is not from a hidden sinus infection. This may represent hidden bacterial infection but without purulence it is hard to localize where this started. The clindamycin that he was started on a think is appropriate. He even though there appears to be a left shift with the elevated  white count, monocytes are markedly elevated. This could represent an adenovirus involving the tear sac that is now blocking drainage and given him all the tearing around his eye. If his side does not improve or should worsen in any way then have an ophthalmology evaluation would be reasonable. If he should improve dramatically in the next couple days then I would send him home on clindamycin orally and he can be followed up if needed.Marland Kitchen   Bryah Ocheltree H 06/30/2016 6:27 PM

## 2016-07-01 LAB — CBC
HCT: 44.6 % (ref 40.0–52.0)
Hemoglobin: 14.8 g/dL (ref 13.0–18.0)
MCH: 29.5 pg (ref 26.0–34.0)
MCHC: 33.2 g/dL (ref 32.0–36.0)
MCV: 88.9 fL (ref 80.0–100.0)
Platelets: 286 K/uL (ref 150–440)
RBC: 5.02 MIL/uL (ref 4.40–5.90)
RDW: 13.5 % (ref 11.5–14.5)
WBC: 12.9 K/uL — ABNORMAL HIGH (ref 3.8–10.6)

## 2016-07-01 MED ORDER — MECLIZINE HCL 25 MG PO TABS: 12.5000 mg | ORAL_TABLET | Freq: Two times a day (BID) | ORAL | Status: DC | PRN

## 2016-07-01 MED ORDER — DEXAMETHASONE SODIUM PHOSPHATE 10 MG/ML IJ SOLN
10.0000 mg | INTRAMUSCULAR | Status: DC
  Administered 2016-07-01: 10 mg via INTRAVENOUS
  Filled 2016-07-01: qty 1

## 2016-07-01 MED ORDER — RISAQUAD PO CAPS
1.0000 | ORAL_CAPSULE | Freq: Every day | ORAL | Status: DC
  Administered 2016-07-01 – 2016-07-02 (×2): 1 via ORAL
  Filled 2016-07-01 (×3): qty 1

## 2016-07-01 MED ORDER — MORPHINE SULFATE (PF) 2 MG/ML IV SOLN
1.0000 mg | Freq: Once | INTRAVENOUS | Status: AC
  Administered 2016-07-01: 1 mg via INTRAVENOUS
  Filled 2016-07-01: qty 1

## 2016-07-01 MED ORDER — METHYLPREDNISOLONE SODIUM SUCC 40 MG IJ SOLR: 40.0000 mg | Freq: Every day | INTRAMUSCULAR | Status: DC

## 2016-07-01 MED ORDER — VALACYCLOVIR HCL 500 MG PO TABS
1000.0000 mg | ORAL_TABLET | Freq: Two times a day (BID) | ORAL | Status: DC
  Administered 2016-07-01 – 2016-07-02 (×2): 1000 mg via ORAL
  Filled 2016-07-01 (×2): qty 2

## 2016-07-01 MED ORDER — PHENOL 1.4 % MT LIQD
1.0000 | OROMUCOSAL | Status: DC | PRN
  Administered 2016-07-01: 1 via OROMUCOSAL
  Filled 2016-07-01 (×2): qty 177

## 2016-07-01 MED ORDER — HYDROCODONE-ACETAMINOPHEN 5-325 MG PO TABS
1.0000 | ORAL_TABLET | ORAL | Status: DC | PRN
  Administered 2016-07-01 – 2016-07-02 (×4): 1 via ORAL
  Filled 2016-07-01 (×4): qty 1

## 2016-07-01 NOTE — Progress Notes (Signed)
[facility] NAME: Brent Diaz    MR#:  [id number]  DATE OF BIRTH:  05/29/1955  SUBJECTIVE:   Patient here due to right periorbital cellulitis. Patient's swelling is much improved since yesterday. Still having some mild drainage in the eye. No fever, nausea, vomiting. No change in vision.   REVIEW OF SYSTEMS:    Review of Systems  Constitutional: Negative for chills and fever.  HENT: Positive for sinus pain. Negative for congestion and tinnitus.   Eyes: Negative for blurred vision and double vision.  Respiratory: Negative for cough, shortness of breath and wheezing.   Cardiovascular: Negative for chest pain, orthopnea and PND.  Gastrointestinal: Negative for abdominal pain, diarrhea, nausea and vomiting.  Genitourinary: Negative for dysuria and hematuria.  Neurological: Negative for dizziness, sensory change and focal weakness.  All other systems reviewed and are negative.   Nutrition: Soft Tolerating Diet: Yes Tolerating PT: Ambulatory  DRUG ALLERGIES:   Allergies  Allergen Reactions  . Aspirin Other (See Comments)    Peptic ulcers    VITALS:  Blood pressure (!) 141/79, pulse 80, temperature 99.4 F (37.4 C), temperature source Oral, resp. rate 18, height 5' 10"  (1.778 m), weight 78 kg (172 lb), SpO2 94 %.  PHYSICAL EXAMINATION:   Physical Exam  GENERAL:  61 y.o.-year-old patient lying in bed in no acute distress.  EYES: Pupils equal, round, reactive to light and accommodation. No scleral icterus. Extraocular muscles intact. Swelling around the right eye, clear drainage noted from right eye.  HEENT: Head atraumatic, normocephalic. Oropharynx and nasopharynx clear.  NECK:  Supple, no jugular venous distention. No thyroid enlargement, no tenderness.  LUNGS: Normal breath sounds bilaterally, no wheezing, rales, rhonchi. No use of accessory muscles of respiration.  CARDIOVASCULAR: S1, S2 normal. No murmurs, rubs, or  gallops.  ABDOMEN: Soft, nontender, nondistended. Bowel sounds present. No organomegaly or mass.  EXTREMITIES: No cyanosis, clubbing or edema b/l.    NEUROLOGIC: Cranial nerves II through XII are intact. No focal Motor or sensory deficits b/l.   PSYCHIATRIC: The patient is alert and oriented x 3.  SKIN: No obvious rash, lesion, or ulcer.    LABORATORY PANEL:   CBC  Recent Labs Lab 07/01/16 0342  WBC 12.9*  HGB 14.8  HCT 44.6  PLT 286   ------------------------------------------------------------------------------------------------------------------  Chemistries   Recent Labs Lab 06/30/16 1344  NA 138  K 4.0  CL 101  CO2 28  GLUCOSE 109*  BUN 11  CREATININE 0.80  CALCIUM 9.1   ------------------------------------------------------------------------------------------------------------------  Cardiac Enzymes No results for input(s): TROPONINI in the last 168 hours. ------------------------------------------------------------------------------------------------------------------  RADIOLOGY:  Ct Maxillofacial W Contrast  Result Date: 06/30/2016 CLINICAL DATA:  61 year old with right orbital swelling for 3 days. Patient is on antibiotics. Right maxilla pressure. EXAM: CT MAXILLOFACIAL WITH CONTRAST TECHNIQUE: Multidetector CT imaging of the maxillofacial structures was performed. Multiplanar CT image reconstructions were also generated. A small metallic BB was placed on the right temple in order to reliably differentiate right from left. COMPARISON:  CT sinus exam 10/13/2009 FINDINGS: Osseous: Negative for a facial bone fracture. Mandibular condyles are located. No suspicious dental disease. There are prominent bone structures along the mucosal surface of the mandible bilaterally. 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 No suspicious abnormalities in cervical spine. Mild disc space narrowing at C4-C5. Orbits: Soft tissue swelling in the right periorbital region,  particularly in the infraorbital region. Normal appearance of both globes. Normal appearance of the retrobulbar  tissues. Sinuses: Paranasal sinuses are clear. Maxillary sinuses are aerated. Soft tissues: Submandibular glands are symmetrical. Small nodules in bilateral parotid glands probably represent intra parotid lymph nodes. Small lymph nodes on both sides of the neck. No evidence for a soft tissue abscess. Limited intracranial: No significant or unexpected finding. IMPRESSION: Right periorbital soft tissue swelling.  No underlying abscess. No acute bone abnormality. No sinus disease. Electronically Signed   By: Markus Daft M.D.   On: 06/30/2016 15:23     ASSESSMENT AND PLAN:   61 year old male with past medical history of Crohn's disease, hypertension who presents to the hospital due to right eye redness and swelling and watery eyes and sinus congestion.  1. Right sided sinusitis with maxillary and periorbital swelling secondary to cellulitis. -Continue IV clindamycin, minimal improvement in the swelling although. We'll start some IV steroids today. -White cell count has improved, await further ENT recommendations discussed with them over the phone today.  2. Oral Thrush - cont. Magic mouthwash & improved.   3. Hx of Crohn's disease - cont. Probiotic - no acute symptoms.   4. HTN - cont. Lisinopril, Norvasc    All the records are reviewed and case discussed with Care Management/Social Worker. Management plans discussed with the patient, family and they are in agreement.  CODE STATUS: Full code  DVT Prophylaxis: Lovenox  TOTAL TIME TAKING CARE OF THIS PATIENT: 30 minutes.   POSSIBLE D/C IN 1-2 DAYS, DEPENDING ON CLINICAL CONDITION.   Henreitta Leber M.D on 07/01/2016 at 2:42 PM  Between 7am to 6pm - Pager - [phone]  After 6pm go to [email or web address] - Proofreader  [facility]  Office  [phone]  CC: Primary care physician; BLISS, Lynnell Jude, MD

## 2016-07-01 NOTE — Consult Note (Signed)
Brent Diaz, Brent Diaz [id number] 1955/06/05 Henreitta Leber, MD  Reason for Consult: To reevaluate right eye swelling  HPI: The patient was admitted yesterday with pain in the right periorbital area and swelling around side. He has not had any purulence or signs of bacterial conjunctivitis. His CT scan showed clear sinuses with no evidence of sinusitis. He still has swelling in his right eye area and pain there and it seems like it is bothering him more. He still is complaining of ulcers inside his mouth and excision not really helping a lot. The Chloraseptic seems to help more with the pain at least temporarily.  Allergies:  Allergies  Allergen Reactions  . Aspirin Other (See Comments)    Peptic ulcers    ROS: Review of systems normal other than 12 systems except per HPI.  PMH:  Past Medical History:  Diagnosis Date  . Cancer ([place])    skin  . Crohn's disease ([place])   . Hypertension     FH: History reviewed. No pertinent family history.  SH:  Social History   Social History  . Marital status: Married    Spouse name: N/A  . Number of children: N/A  . Years of education: N/A   Occupational History  . Not on file.   Social History Main Topics  . Smoking status: Current Every Day Smoker    Packs/day: 0.50    Types: Cigarettes  . Smokeless tobacco: Never Used  . Alcohol use No  . Drug use: Unknown  . Sexual activity: Not on file   Other Topics Concern  . Not on file   Social History Narrative  . No narrative on file    PSH:  Past Surgical History:  Procedure Laterality Date  . ABDOMINAL SURGERY    . BOWEL RESECTION      Physical  Exam: Patient has poor watery edema of his right lower lid. The eyeball does not have any redness and the extraocular movements are still intact. Nose is open and clear. The right cheek now shows a clear watery blister in the mid cheek area that is approximately 1 cm in diameter. There is now an obvious skin lesion on the upper right for had  this reddened and a second one in the anterior hairline more medially. Intraorally he has ulcers on the right half of his hard and soft palate but almost nothing on the left side at all.   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 Start Valtrex 1000 mg twice a day and we can stop the clindamycin as there is no clear evidence for any bacterial infection. I will write for some Chloraseptic spray that he can use intraorally to help numb his throat a little bit that we will allow him to eat without as much pain. I think he can probably be discharged home tomorrow on Valtrex however I think he should be seen by an ophthalmologist tomorrow to make sure that the orbit is not involved with the shingles. He will be put in isolation tonight. He does not need an ENT follow-up at this point unless there are other challenges that should arise.  Cc: Dr. Lavera Guise, MD   Huey Romans 07/01/2016 6:12 PM

## 2016-07-02 MED ORDER — VALACYCLOVIR HCL 1 G PO TABS: 1000.0000 mg | ORAL_TABLET | Freq: Three times a day (TID) | ORAL | 0 refills | Status: AC

## 2016-07-02 NOTE — Discharge Summary (Signed)
[facility] [place] NAME: Brent Diaz    MR#:  [id number]  DATE OF BIRTH:  01/26/1956  DATE OF ADMISSION:  06/30/2016 ADMITTING PHYSICIAN: Fritzi Mandes, MD  DATE OF DISCHARGE: 07/02/2016  2:02 PM  PRIMARY CARE PHYSICIAN: BLISS, Lynnell Jude, MD    ADMISSION DIAGNOSIS:  Acute bacterial sinusitis [J01.90, B96.89] Leukocytosis, unspecified type [D72.829]  DISCHARGE DIAGNOSIS:  Active Problems:   Eye swelling, right   SECONDARY DIAGNOSIS:   Past Medical History:  Diagnosis Date  . Cancer ([place])    skin  . Crohn's disease ([place])   . Hypertension     HOSPITAL COURSE:   61 year old male with past medical history of Crohn's disease, hypertension who presents to the hospital due to right eye redness and swelling and watery eyes and sinus congestion.  1. Right sided sinusitis with maxillary and periorbital swelling -This was initially thought to be secondary to cellulitis, and patient was started on IV clindamycin. His swelling did not improve and ENT evaluated the patient and thought this could be related to shingles. Patient was started on some Valtrex. -He was also given some IV steroids. He has remained afebrile and his white cell count is improved. He was discharged on some oral Valtrex, and outpatient ophthalmology evaluation. He was made an appointment with Opthalmology with Dr. Edison Pace on the day of discharge.   2. Oral Thrush - this improved and resolved w/ Magic mouthwash.     3. Hx of Crohn's disease - no acute relapse while in the hospital.  - he will resume outpatient follow up. Cont. Humira as tolerated as outpatient.  4. HTN - pt. Will resume his Norvasc   his Protonix. DISCHARGE CONDITIONS:   Stable.   CONSULTS OBTAINED:  Treatment Team:  Margaretha Sheffield, MD  DRUG ALLERGIES:   Allergies  Allergen Reactions  . Aspirin Other (See Comments)    Peptic ulcers    DISCHARGE MEDICATIONS:   Allergies as of 07/02/2016       Reactions   Aspirin Other (See Comments)   Peptic ulcers      Medication List    STOP taking these medications   cefUROXime 500 MG tablet Commonly known as:  CEFTIN   predniSONE 10 MG (21) Tbpk tablet Commonly known as:  STERAPRED UNI-PAK 21 TAB     TAKE these medications   albuterol 108 (90 Base) MCG/ACT inhaler Commonly known as:  PROVENTIL HFA;VENTOLIN HFA Inhale 2 puffs into the lungs every 6 (six) hours as needed for wheezing or shortness of breath.   amLODipine 10 MG tablet Commonly known as:  NORVASC Take 10 mg by mouth daily.   folic acid 1 MG tablet Commonly known as:  FOLVITE Take 1 mg by mouth daily.   HUMIRA PEN [place] Inject into the skin.   hyoscyamine 0.125 MG tablet Commonly known as:  LEVSIN, ANASPAZ Take 0.125 mg by mouth every 4 (four) hours as needed.   loratadine 10 MG tablet Commonly known as:  CLARITIN Take 10 mg by mouth daily.   pantoprazole 40 MG tablet Commonly known as:  PROTONIX Take 40 mg by mouth 2 (two) times daily.   promethazine 25 MG tablet Commonly known as:  PHENERGAN Take 25 mg by mouth every 6 (six) hours as needed for nausea or vomiting.   valACYclovir 1000 MG tablet Commonly known as:  VALTREX Take 1 tablet (1,000 mg total) by mouth 3 (three) times daily.   Vitamin D (Ergocalciferol) 50000 units  Caps capsule Commonly known as:  DRISDOL Take 50,000 Units by mouth every 7 (seven) days.   zolpidem 10 MG tablet Commonly known as:  AMBIEN Take 10 mg by mouth at bedtime as needed for sleep.         DISCHARGE INSTRUCTIONS:   DIET:  Cardiac diet  DISCHARGE CONDITION:  Stable  ACTIVITY:  Activity as tolerated  OXYGEN:  Home Oxygen: No.   Oxygen Delivery: room air  DISCHARGE LOCATION:  home   If you experience worsening of your admission symptoms, develop shortness of breath, life threatening emergency, suicidal or homicidal thoughts you must seek medical attention immediately by calling 911 or calling your  MD immediately  if symptoms less severe.  You Must read complete instructions/literature along with all the possible adverse reactions/side effects for all the Medicines you take and that have been prescribed to you. Take any new Medicines after you have completely understood and accpet all the possible adverse reactions/side effects.   Please note  You were cared for by a hospitalist during your hospital stay. If you have any questions about your discharge medications or the care you received while you were in the hospital after you are discharged, you can call the unit and asked to speak with the hospitalist on call if the hospitalist that took care of you is not available. Once you are discharged, your primary care physician will handle any further medical issues. Please note that NO REFILLS for any discharge medications will be authorized once you are discharged, as it is imperative that you return to your primary care physician (or establish a relationship with a primary care physician if you do not have one) for your aftercare needs so that they can reassess your need for medications and monitor your lab values.     Today   Right sided peri-orbital swelling improving. Afebrile.  No blurry vision. Wife at bedside.   VITAL SIGNS:  Blood pressure 129/78, pulse (!) 50, temperature 97.6 F (36.4 C), temperature source Oral, resp. rate 18, height 5' 10"  (1.778 m), weight 78 kg (172 lb), SpO2 96 %.  I/O:   Intake/Output Summary (Last 24 hours) at 07/02/16 1552 Last data filed at 07/02/16 0346  Gross per 24 hour  Intake              240 ml  Output              825 ml  Net             -585 ml    PHYSICAL EXAMINATION:   GENERAL:  61 y.o.-year-old patient lying in bed in no acute distress.  EYES: Pupils equal, round, reactive to light and accommodation. No scleral icterus. Extraocular muscles intact. Swelling around the right eye, clear drainage noted from right eye.  Slight conjunctival  injection.  HEENT: Head atraumatic, normocephalic. Oropharynx and nasopharynx clear.  NECK:  Supple, no jugular venous distention. No thyroid enlargement, no tenderness.  LUNGS: Normal breath sounds bilaterally, no wheezing, rales, rhonchi. No use of accessory muscles of respiration.  CARDIOVASCULAR: S1, S2 normal. No murmurs, rubs, or gallops.  ABDOMEN: Soft, nontender, nondistended. Bowel sounds present. No organomegaly or mass.  EXTREMITIES: No cyanosis, clubbing or edema b/l.    NEUROLOGIC: Cranial nerves II through XII are intact. No focal Motor or sensory deficits b/l.   PSYCHIATRIC: The patient is alert and oriented x 3.  SKIN: No obvious rash, lesion, or ulcer. Vesicles noted on Right side of forehead consistent  with possible shingles.   DATA REVIEW:   CBC  Recent Labs Lab 07/01/16 0342  WBC 12.9*  HGB 14.8  HCT 44.6  PLT 286    Chemistries   Recent Labs Lab 06/30/16 1344  NA 138  K 4.0  CL 101  CO2 28  GLUCOSE 109*  BUN 11  CREATININE 0.80  CALCIUM 9.1    Cardiac Enzymes No results for input(s): TROPONINI in the last 168 hours.    RADIOLOGY:  No results found.    Management plans discussed with the patient, family and they are in agreement.  CODE STATUS:     Code Status Orders        Start     Ordered   06/30/16 1813  Full code  Continuous     06/30/16 1812    Code Status History    Date Active Date Inactive Code Status Order ID Comments User Context   This patient has a current code status but no historical code status.      TOTAL TIME TAKING CARE OF THIS PATIENT: 40 minutes.    Henreitta Leber M.D on 07/02/2016 at 3:52 PM  Between 7am to 6pm - Pager - [phone]  After 6pm go to [email or web address] - Proofreader  [facility]  Office  [phone]  CC: Primary care physician; BLISS, Lynnell Jude, MD

## 2016-07-02 NOTE — Care Management Important Message (Signed)
Important Message  Patient Details  Name: Brent Diaz MRN: [id number] Date of Birth: 1956/01/19   Medicare Important Message Given:  N/A - LOS <3 / Initial given by admissions    Beverly Sessions, RN 07/02/2016, 10:05 AM

## 2016-07-02 NOTE — Progress Notes (Signed)
Patient discharge teaching given, including activity, diet, follow-up appoints, and medications. Patient verbalized understanding of all discharge instructions. IV access was d/c'd. Vitals are stable. Skin is intact except as charted in most recent assessments. Pt to be escorted out by NT, to be driven home by family.  Jourdain Guay [employer]

## 2016-09-17 DIAGNOSIS — B029 Zoster without complications: Secondary | ICD-10-CM | POA: Insufficient documentation

## 2017-03-24 ENCOUNTER — Encounter: Payer: Self-pay | Admitting: *Deleted

## 2017-03-24 NOTE — Discharge Instructions (Signed)
[facility] °[facility] °ENDOSCOPIC SINUS SURGERY °[facility] ° °What is Functional Endoscopic Sinus Surgery? ° The Surgery involves making the natural openings of the sinuses larger by removing the bony partitions that separate the sinuses from the nasal cavity.  The natural sinus lining is preserved as much as possible to allow the sinuses to resume normal function after the surgery.  In some patients nasal polyps (excessively swollen lining of the sinuses) may be removed to relieve obstruction of the sinus openings.  The surgery is performed through the nose using lighted scopes, which eliminates the need for incisions on the face.  A septoplasty is a different procedure which is sometimes performed with sinus surgery.  It involves straightening the boy partition that separates the two sides of your nose.  A crooked or deviated septum may need repair if is obstructing the sinuses or nasal airflow.  Turbinate reduction is also often performed during sinus surgery.  The turbinates are bony proturberances from the side walls of the nose which swell and can obstruct the nose in patients with sinus and allergy problems.  Their size can be surgically reduced to help relieve nasal obstruction. ° °What Can Sinus Surgery Do For Me? ° Sinus surgery can reduce the frequency of sinus infections requiring antibiotic treatment.  This can provide improvement in nasal congestion, post-nasal drainage, facial pressure and nasal obstruction.  Surgery will NOT prevent you from ever having an infection again, so it usually only for patients who get infections 4 or more times yearly requiring antibiotics, or for infections that do not clear with antibiotics.  It will not cure nasal allergies, so patients with allergies may still require medication to treat their allergies after surgery. Surgery may improve headaches related to sinusitis, however, some people will continue to  require medication to control sinus headaches related to allergies.  Surgery will do nothing for other forms of headache (migraine, tension or cluster). ° °What Are the Risks of Endoscopic Sinus Surgery? ° Current techniques allow surgery to be performed safely with little risk, however, there are rare complications that patients should be aware of.  Because the sinuses are located around the eyes, there is risk of eye injury, including blindness, though again, this would be quite rare. This is usually a result of bleeding behind the eye during surgery, which puts the vision oat risk, though there are treatments to protect the vision and prevent permanent disrupted by surgery causing a leak of the spinal fluid that surrounds the brain.  More serious complications would include bleeding inside the brain cavity or damage to the brain.  Again, all of these complications are uncommon, and spinal fluid leaks can be safely managed surgically if they occur.  The most common complication of sinus surgery is bleeding from the nose, which may require packing or cauterization of the nose.  Continued sinus have polyps may experience recurrence of the polyps requiring revision surgery.  Alterations of sense of smell or injury to the tear ducts are also rare complications.  ° °What is the Surgery Like, and what is the Recovery? ° The Surgery usually takes a couple of hours to perform, and is usually performed under a general anesthetic (completely asleep).  Patients are usually discharged home after a couple of hours.  Sometimes during surgery it is necessary to pack the nose to control bleeding, and the packing is left in place for 24 - 48 hours, and removed by your surgeon.    If a septoplasty was performed during the procedure, there is often a splint placed which must be removed after 5-7 days.   °Discomfort: Pain is usually mild to moderate, and can be controlled by prescription pain medication or acetaminophen (Tylenol).   Aspirin, Ibuprofen (Advil, Motrin), or Naprosyn (Aleve) should be avoided, as they can cause increased bleeding.  Most patients feel sinus pressure like they have a bad head cold for several days.  Sleeping with your head elevated can help reduce swelling and facial pressure, as can ice packs over the face.  A humidifier may be helpful to keep the mucous and blood from drying in the nose.  ° °Diet: There are no specific diet restrictions, however, you should generally start with clear liquids and a light diet of bland foods because the anesthetic can cause some nausea.  Advance your diet depending on how your stomach feels.  Taking your pain medication with food will often help reduce stomach upset which pain medications can cause. ° °Nasal Saline Irrigation: It is important to remove blood clots and dried mucous from the nose as it is healing.  This is done by having you irrigate the nose at least 3 - 4 times daily with a salt water solution.  We recommend using NeilMed Sinus Rinse (available at the drug store).  Fill the squeeze bottle with the solution, bend over a sink, and insert the tip of the squeeze bottle into the nose ½ of an inch.  Point the tip of the squeeze bottle towards the inside corner of the eye on the same side your irrigating.  Squeeze the bottle and gently irrigate the nose.  If you bend forward as you do this, most of the fluid will flow back out of the nose, instead of down your throat.   The solution should be warm, near body temperature, when you irrigate.   Each time you irrigate, you should use a full squeeze bottle.  ° °Note that if you are instructed to use Nasal Steroid Sprays at any time after your surgery, irrigate with saline BEFORE using the steroid spray, so you do not wash it all out of the nose. °Another product, Nasal Saline Gel (such as AYR Nasal Saline Gel) can be applied in each nostril 3 - 4 times daily to moisture the nose and reduce scabbing or crusting. ° °Bleeding:   Bloody drainage from the nose can be expected for several days, and patients are instructed to irrigate their nose frequently with salt water to help remove mucous and blood clots.  The drainage may be dark red or brown, though some fresh blood may be seen intermittently, especially after irrigation.  Do not blow you nose, as bleeding may occur. If you must sneeze, keep your mouth open to allow air to escape through your mouth. ° °If heavy bleeding occurs: Irrigate the nose with saline to rinse out clots, then spray the nose 3 - 4 times with Afrin Nasal Decongestant Spray.  The spray will constrict the blood vessels to slow bleeding.  Pinch the lower half of your nose shut to apply pressure, and lay down with your head elevated.  Ice packs over the nose may help as well. If bleeding persists despite these measures, you should notify your doctor.  Do not use the Afrin routinely to control nasal congestion after surgery, as it can result in worsening congestion and may affect healing.  ° ° ° °Activity: Return to work varies among patients. Most patients will be   out of work at least 5 - 7 days to recover.  Patient may return to work after they are off of narcotic pain medication, and feeling well enough to perform the functions of their job.  Patients must avoid heavy lifting (over 10 pounds) or strenuous physical for 2 weeks after surgery, so your employer may need to assign you to light duty, or keep you out of work longer if light duty is not possible.  NOTE: you should not drive, operate dangerous machinery, do any mentally demanding tasks or make any important legal or financial decisions while on narcotic pain medication and recovering from the general anesthetic.  °  °Call Your Doctor Immediately if You Have Any of the Following: °1. Bleeding that you cannot control with the above measures °2. Loss of vision, double vision, bulging of the eye or black eyes. °3. Fever over 101 degrees °4. Neck stiffness with  severe headache, fever, nausea and change in mental state. °You are always encourage to call anytime with concerns, however, please call with requests for pain medication refills during office hours. ° °Office Endoscopy: During follow-up visits your doctor will remove any packing or splints that may have been placed and evaluate and clean your sinuses endoscopically.  Topical anesthetic will be used to make this as comfortable as possible, though you may want to take your pain medication prior to the visit.  How often this will need to be done varies from patient to patient.  After complete recovery from the surgery, you may need follow-up endoscopy from time to time, particularly if there is concern of recurrent infection or nasal polyps. ° ° °General Anesthesia, Adult, Care After °These instructions provide you with information about caring for yourself after your procedure. Your health care provider may also give you more specific instructions. Your treatment has been planned according to current medical practices, but problems sometimes occur. Call your health care provider if you have any problems or questions after your procedure. °What can I expect after the procedure? °After the procedure, it is common to have: °· Vomiting. °· A sore throat. °· Mental slowness. ° °It is common to feel: °· Nauseous. °· Cold or shivery. °· Sleepy. °· Tired. °· Sore or achy, even in parts of your body where you did not have surgery. ° °Follow these instructions at home: °For at least 24 hours after the procedure: °· Do not: °? Participate in activities where you could fall or become injured. °? Drive. °? Use heavy machinery. °? Drink alcohol. °? Take sleeping pills or medicines that cause drowsiness. °? Make important decisions or sign legal documents. °? Take care of children on your own. °· Rest. °Eating and drinking °· If you vomit, drink water, juice, or soup when you can drink without vomiting. °· Drink enough fluid to  keep your urine clear or pale yellow. °· Make sure you have little or no nausea before eating solid foods. °· Follow the diet recommended by your health care provider. °General instructions °· Have a responsible adult stay with you until you are awake and alert. °· Return to your normal activities as told by your health care provider. Ask your health care provider what activities are safe for you. °· Take over-the-counter and prescription medicines only as told by your health care provider. °· If you smoke, do not smoke without supervision. °· Keep all follow-up visits as told by your health care provider. This is important. °Contact a health care provider if: °· You   continue to have nausea or vomiting at home, and medicines are not helpful. °· You cannot drink fluids or start eating again. °· You cannot urinate after 8-12 hours. °· You develop a skin rash. °· You have fever. °· You have increasing redness at the site of your procedure. °Get help right away if: °· You have difficulty breathing. °· You have chest pain. °· You have unexpected bleeding. °· You feel that you are having a life-threatening or urgent problem. °This information is not intended to replace advice given to you by your health care provider. Make sure you discuss any questions you have with your health care provider. °Document Released: 07/28/2000 Document Revised: 09/24/2015 Document Reviewed: 04/05/2015 °[employer]vier Interactive Patient Education © 2018 [employer] ° °

## 2017-03-25 ENCOUNTER — Ambulatory Visit: Payer: Medicare Other | Admitting: Anesthesiology

## 2017-03-30 ENCOUNTER — Ambulatory Visit
Admission: RE | Admit: 2017-03-30 | Discharge: 2017-03-30 | Disposition: A | Discharge status: Home / Self Care | Payer: Medicare Other | Source: Ambulatory Visit | Attending: Otolaryngology | Admitting: Otolaryngology

## 2017-03-30 DIAGNOSIS — I10 Essential (primary) hypertension: Secondary | ICD-10-CM | POA: Insufficient documentation

## 2017-03-30 DIAGNOSIS — K5090 Crohn's disease, unspecified, without complications: Secondary | ICD-10-CM | POA: Insufficient documentation

## 2017-03-30 DIAGNOSIS — K219 Gastro-esophageal reflux disease without esophagitis: Secondary | ICD-10-CM | POA: Diagnosis not present

## 2017-03-30 DIAGNOSIS — F17210 Nicotine dependence, cigarettes, uncomplicated: Secondary | ICD-10-CM | POA: Insufficient documentation

## 2017-03-30 NOTE — Anesthesia Preprocedure Evaluation (Deleted)
Anesthesia Evaluation  Patient identified by MRN, date of birth, ID band Patient awake    History of Anesthesia Complications (+) history of anesthetic complications  Airway Mallampati: III  TM Distance: >3 FB Neck ROM: Full    Dental  (+)    Pulmonary Current Smoker (1/2 ppd),    Pulmonary exam normal breath sounds clear to auscultation       Cardiovascular Exercise Tolerance: Good hypertension, Normal cardiovascular exam Rhythm:Regular Rate:Normal  ECG 03/30/17: NSR, possible LA enlargement   Neuro/Psych negative neurological ROS     GI/Hepatic GERD  ,Crohn's disease   Endo/Other  negative endocrine ROS  Renal/GU negative Renal ROS     Musculoskeletal   Abdominal   Peds  Hematology negative hematology ROS (+)   Anesthesia Other Findings   Reproductive/Obstetrics                            Anesthesia Physical Anesthesia Plan  ASA: III  Anesthesia Plan: General   Post-op Pain Management:    Induction: Intravenous  PONV Risk Score and Plan: 1 and Ondansetron  Airway Management Planned: Oral ETT  Additional Equipment:   Intra-op Plan:   Post-operative Plan: Extubation in OR  Informed Consent: I have reviewed the patients History and Physical, chart, labs and discussed the procedure including the risks, benefits and alternatives for the proposed anesthesia with the patient or authorized representative who has indicated his/her understanding and acceptance.     Plan Discussed with: CRNA  Anesthesia Plan Comments: (Pt with blood pressures in the 170s-180s over 100s in preop.  Gave hydralazine 25m IV x2 without any change in pressures.  Discussed with Dr. JKathyrn Sheriffand decided to cancel case for further workup and control of BP.  Informed pt, who understood and agreed with plan.  Pt will contact PCP Dr. BClemmie Krilltoday, as will Dr. JKathyrn Sheriffto discuss.)       Anesthesia Quick  Evaluation

## 2017-04-02 ENCOUNTER — Ambulatory Visit
Admission: RE | Admit: 2017-04-02 | Discharge: 2017-04-02 | Disposition: A | Discharge status: Home / Self Care | Payer: Medicare Other | Source: Ambulatory Visit | Attending: Otolaryngology | Admitting: Otolaryngology

## 2017-04-02 ENCOUNTER — Encounter
Admission: RE | Disposition: A | Discharge status: Home / Self Care | Payer: Self-pay | Source: Ambulatory Visit | Attending: Otolaryngology

## 2017-04-02 ENCOUNTER — Encounter: Payer: Self-pay | Admitting: Otolaryngology

## 2017-04-02 DIAGNOSIS — Z5329 Procedure and treatment not carried out because of patient's decision for other reasons: Secondary | ICD-10-CM | POA: Diagnosis not present

## 2017-04-02 DIAGNOSIS — J329 Chronic sinusitis, unspecified: Secondary | ICD-10-CM | POA: Insufficient documentation

## 2017-04-02 DIAGNOSIS — F17210 Nicotine dependence, cigarettes, uncomplicated: Secondary | ICD-10-CM | POA: Diagnosis not present

## 2017-04-02 DIAGNOSIS — J342 Deviated nasal septum: Secondary | ICD-10-CM | POA: Insufficient documentation

## 2017-04-02 DIAGNOSIS — I10 Essential (primary) hypertension: Secondary | ICD-10-CM | POA: Insufficient documentation

## 2017-04-02 HISTORY — DX: Gastro-esophageal reflux disease without esophagitis: K21.9

## 2017-04-02 HISTORY — DX: Zoster without complications: B02.9

## 2017-04-02 HISTORY — DX: Presence of spectacles and contact lenses: Z97.3

## 2017-04-02 SURGERY — SINUS SURGERY, WITH IMAGING GUIDANCE
Anesthesia: General | Laterality: Right

## 2017-04-02 MED ORDER — PROMETHAZINE HCL 25 MG/ML IJ SOLN: 6.2500 mg | INTRAMUSCULAR | Status: DC | PRN

## 2017-04-02 MED ORDER — OXYMETAZOLINE HCL 0.05 % NA SOLN
2.0000 | Freq: Once | NASAL | Status: AC
  Administered 2017-04-02: 2 via NASAL

## 2017-04-02 MED ORDER — ACETAMINOPHEN 10 MG/ML IV SOLN: 1000.0000 mg | Freq: Once | INTRAVENOUS | Status: DC

## 2017-04-02 MED ORDER — HYDRALAZINE HCL 20 MG/ML IJ SOLN
5.0000 mg | Freq: Once | INTRAMUSCULAR | Status: AC
  Administered 2017-04-02: 5 mg via INTRAVENOUS

## 2017-04-02 MED ORDER — CEFAZOLIN SODIUM-DEXTROSE 2-4 GM/100ML-% IV SOLN: 2.0000 g | Freq: Once | INTRAVENOUS | Status: DC

## 2017-04-02 MED ORDER — OXYCODONE HCL 5 MG/5ML PO SOLN: 5.0000 mg | Freq: Once | ORAL | Status: DC | PRN

## 2017-04-02 MED ORDER — MEPERIDINE HCL 25 MG/ML IJ SOLN: 6.2500 mg | INTRAMUSCULAR | Status: DC | PRN

## 2017-04-02 MED ORDER — FENTANYL CITRATE (PF) 100 MCG/2ML IJ SOLN: 25.0000 ug | INTRAMUSCULAR | Status: DC | PRN

## 2017-04-02 MED ORDER — OXYCODONE HCL 5 MG PO TABS: 5.0000 mg | ORAL_TABLET | Freq: Once | ORAL | Status: DC | PRN

## 2017-04-02 MED ORDER — LACTATED RINGERS IV SOLN: 10.0000 mL/h | INTRAVENOUS | Status: DC

## 2017-04-02 SURGICAL SUPPLY — 54 items
BALLOON SINUPLASTY SYSTEM (BALLOONS) IMPLANT
BATTERY INSTRU NAVIGATION (MISCELLANEOUS) ×20 IMPLANT
BLADE SURG 15 STRL LF DISP TIS (BLADE) IMPLANT
BLADE SURG 15 STRL SS (BLADE)
CANISTER SUCT 1200ML W/VALVE (MISCELLANEOUS) ×5 IMPLANT
CATH IV 18X1 1/4 SAFELET (CATHETERS) ×5 IMPLANT
COAGULATOR SUCT 8FR VV (MISCELLANEOUS) ×5 IMPLANT
CORD BIP STRL DISP 12FT (MISCELLANEOUS) IMPLANT
DEVICE INFLATION SEID (MISCELLANEOUS) IMPLANT
DRAPE HEAD BAR (DRAPES) ×5 IMPLANT
DRESSING TELFA 4X3 1S ST N-ADH (GAUZE/BANDAGES/DRESSINGS) IMPLANT
ELECT CAUTERY BLADE TIP 2.5 (TIP)
ELECT CAUTERY NEEDLE 2.0 MIC (NEEDLE) ×5 IMPLANT
ELECTRODE CAUTERY BLDE TIP 2.5 (TIP) IMPLANT
GAUZE SPONGE 4X4 12PLY STRL (GAUZE/BANDAGES/DRESSINGS) IMPLANT
GLOVE PI ULTRA LF STRL 7.5 (GLOVE) ×6 IMPLANT
GLOVE PI ULTRA NON LATEX 7.5 (GLOVE) ×4
IV CATH 18X1 1/4 SAFELET (CATHETERS) ×3
IV NS 500ML (IV SOLUTION) ×2
IV NS 500ML BAXH (IV SOLUTION) ×3 IMPLANT
KIT ROOM TURNOVER OR (KITS) ×5 IMPLANT
LIQUID BAND (GAUZE/BANDAGES/DRESSINGS) IMPLANT
NEEDLE ANESTHESIA  27G X 3.5 (NEEDLE) ×2
NEEDLE ANESTHESIA 27G X 3.5 (NEEDLE) ×3 IMPLANT
NEEDLE HYPO 25GX1X1/2 BEV (NEEDLE) ×5 IMPLANT
NEEDLE SPNL 25GX3.5 QUINCKE BL (NEEDLE) ×5 IMPLANT
NS IRRIG 500ML POUR BTL (IV SOLUTION) ×5 IMPLANT
PACK DRAPE NASAL/ENT (PACKS) ×5 IMPLANT
PACKING NASAL EPIS 4X2.4 XEROG (MISCELLANEOUS) ×10 IMPLANT
PAD GROUND ADULT SPLIT (MISCELLANEOUS) ×5 IMPLANT
PATTIES SURGICAL .5 X3 (DISPOSABLE) ×5 IMPLANT
PENCIL ELECTRO HAND CTR (MISCELLANEOUS) ×5 IMPLANT
SHAVER DIEGO BLD STD TYPE A (BLADE) IMPLANT
SOL ANTI-FOG 6CC FOG-OUT (MISCELLANEOUS) ×3 IMPLANT
SOL FOG-OUT ANTI-FOG 6CC (MISCELLANEOUS) ×2
SOL PREP PVP 2OZ (MISCELLANEOUS) ×5
SOLUTION PREP PVP 2OZ (MISCELLANEOUS) ×3 IMPLANT
SPLINT NASAL SEPTAL BLV .50 ST (MISCELLANEOUS) ×5 IMPLANT
STRAP BODY AND KNEE 60X3 (MISCELLANEOUS) ×5 IMPLANT
SUCTION FRAZIER TIP 10 FR DISP (SUCTIONS) IMPLANT
SUT CHROMIC 3-0 (SUTURE)
SUT CHROMIC 3-0 KS 27XMFL CR (SUTURE)
SUT ETHILON 3-0 KS 30 BLK (SUTURE) ×5 IMPLANT
SUT PLAIN GUT 4-0 (SUTURE) ×5 IMPLANT
SUT PROLENE 5 0 P 3 (SUTURE) ×5 IMPLANT
SUT VIC AB 4-0 RB1 27 (SUTURE) ×2
SUT VIC AB 4-0 RB1 27X BRD (SUTURE) ×3 IMPLANT
SUTURE CHRMC 3-0 KS 27XMFL CR (SUTURE) IMPLANT
SYR 3ML LL SCALE MARK (SYRINGE) ×5 IMPLANT
SYRINGE 10CC LL (SYRINGE) ×5 IMPLANT
TOWEL OR 17X26 4PK STRL BLUE (TOWEL DISPOSABLE) ×5 IMPLANT
TRACKER CRANIALMASK (MASK) ×5 IMPLANT
TUBING DECLOG MULTIDEBRIDER (TUBING) IMPLANT
WATER STERILE IRR 250ML POUR (IV SOLUTION) ×5 IMPLANT

## 2017-04-02 NOTE — H&P (Signed)
H&P has been reviewed and the patient reevaluated, and no changes necessary. To be downloaded later.

## 2017-04-02 NOTE — Progress Notes (Signed)
Patient canceled per surgeon due to high blood pressure

## 2017-05-12 ENCOUNTER — Other Ambulatory Visit
Admission: RE | Admit: 2017-05-12 | Discharge: 2017-05-12 | Disposition: A | Discharge status: Home / Self Care | Payer: Medicare PPO | Source: Ambulatory Visit | Attending: Family Medicine | Admitting: Family Medicine

## 2017-05-12 DIAGNOSIS — D72829 Elevated white blood cell count, unspecified: Secondary | ICD-10-CM | POA: Diagnosis present

## 2017-05-12 DIAGNOSIS — I10 Essential (primary) hypertension: Secondary | ICD-10-CM | POA: Insufficient documentation

## 2017-05-13 LAB — RPR: RPR Ser Ql: NONREACTIVE

## 2017-05-14 ENCOUNTER — Encounter: Payer: Self-pay | Admitting: *Deleted

## 2017-05-14 ENCOUNTER — Other Ambulatory Visit: Payer: Self-pay

## 2017-05-14 LAB — QUANTIFERON-TB GOLD PLUS (RQFGPL)
QUANTIFERON MITOGEN VALUE: 6.36 [IU]/mL
QUANTIFERON TB2 AG VALUE: 0.03 [IU]/mL
QuantiFERON Nil Value: 0.03 IU/mL
QuantiFERON TB1 Ag Value: 0.02 IU/mL

## 2017-05-14 LAB — QUANTIFERON-TB GOLD PLUS: QuantiFERON-TB Gold Plus: NEGATIVE

## 2017-05-18 NOTE — Discharge Instructions (Signed)
[facility] °[facility] °ENDOSCOPIC SINUS SURGERY °[facility] ° °What is Functional Endoscopic Sinus Surgery? ° The Surgery involves making the natural openings of the sinuses larger by removing the bony partitions that separate the sinuses from the nasal cavity.  The natural sinus lining is preserved as much as possible to allow the sinuses to resume normal function after the surgery.  In some patients nasal polyps (excessively swollen lining of the sinuses) may be removed to relieve obstruction of the sinus openings.  The surgery is performed through the nose using lighted scopes, which eliminates the need for incisions on the face.  A septoplasty is a different procedure which is sometimes performed with sinus surgery.  It involves straightening the boy partition that separates the two sides of your nose.  A crooked or deviated septum may need repair if is obstructing the sinuses or nasal airflow.  Turbinate reduction is also often performed during sinus surgery.  The turbinates are bony proturberances from the side walls of the nose which swell and can obstruct the nose in patients with sinus and allergy problems.  Their size can be surgically reduced to help relieve nasal obstruction. ° °What Can Sinus Surgery Do For Me? ° Sinus surgery can reduce the frequency of sinus infections requiring antibiotic treatment.  This can provide improvement in nasal congestion, post-nasal drainage, facial pressure and nasal obstruction.  Surgery will NOT prevent you from ever having an infection again, so it usually only for patients who get infections 4 or more times yearly requiring antibiotics, or for infections that do not clear with antibiotics.  It will not cure nasal allergies, so patients with allergies may still require medication to treat their allergies after surgery. Surgery may improve headaches related to sinusitis, however, some people will continue to  require medication to control sinus headaches related to allergies.  Surgery will do nothing for other forms of headache (migraine, tension or cluster). ° °What Are the Risks of Endoscopic Sinus Surgery? ° Current techniques allow surgery to be performed safely with little risk, however, there are rare complications that patients should be aware of.  Because the sinuses are located around the eyes, there is risk of eye injury, including blindness, though again, this would be quite rare. This is usually a result of bleeding behind the eye during surgery, which puts the vision oat risk, though there are treatments to protect the vision and prevent permanent disrupted by surgery causing a leak of the spinal fluid that surrounds the brain.  More serious complications would include bleeding inside the brain cavity or damage to the brain.  Again, all of these complications are uncommon, and spinal fluid leaks can be safely managed surgically if they occur.  The most common complication of sinus surgery is bleeding from the nose, which may require packing or cauterization of the nose.  Continued sinus have polyps may experience recurrence of the polyps requiring revision surgery.  Alterations of sense of smell or injury to the tear ducts are also rare complications.  ° °What is the Surgery Like, and what is the Recovery? ° The Surgery usually takes a couple of hours to perform, and is usually performed under a general anesthetic (completely asleep).  Patients are usually discharged home after a couple of hours.  Sometimes during surgery it is necessary to pack the nose to control bleeding, and the packing is left in place for 24 - 48 hours, and removed by your surgeon.    If a septoplasty was performed during the procedure, there is often a splint placed which must be removed after 5-7 days.   °Discomfort: Pain is usually mild to moderate, and can be controlled by prescription pain medication or acetaminophen (Tylenol).   Aspirin, Ibuprofen (Advil, Motrin), or Naprosyn (Aleve) should be avoided, as they can cause increased bleeding.  Most patients feel sinus pressure like they have a bad head cold for several days.  Sleeping with your head elevated can help reduce swelling and facial pressure, as can ice packs over the face.  A humidifier may be helpful to keep the mucous and blood from drying in the nose.  ° °Diet: There are no specific diet restrictions, however, you should generally start with clear liquids and a light diet of bland foods because the anesthetic can cause some nausea.  Advance your diet depending on how your stomach feels.  Taking your pain medication with food will often help reduce stomach upset which pain medications can cause. ° °Nasal Saline Irrigation: It is important to remove blood clots and dried mucous from the nose as it is healing.  This is done by having you irrigate the nose at least 3 - 4 times daily with a salt water solution.  We recommend using NeilMed Sinus Rinse (available at the drug store).  Fill the squeeze bottle with the solution, bend over a sink, and insert the tip of the squeeze bottle into the nose ½ of an inch.  Point the tip of the squeeze bottle towards the inside corner of the eye on the same side your irrigating.  Squeeze the bottle and gently irrigate the nose.  If you bend forward as you do this, most of the fluid will flow back out of the nose, instead of down your throat.   The solution should be warm, near body temperature, when you irrigate.   Each time you irrigate, you should use a full squeeze bottle.  ° °Note that if you are instructed to use Nasal Steroid Sprays at any time after your surgery, irrigate with saline BEFORE using the steroid spray, so you do not wash it all out of the nose. °Another product, Nasal Saline Gel (such as AYR Nasal Saline Gel) can be applied in each nostril 3 - 4 times daily to moisture the nose and reduce scabbing or crusting. ° °Bleeding:   Bloody drainage from the nose can be expected for several days, and patients are instructed to irrigate their nose frequently with salt water to help remove mucous and blood clots.  The drainage may be dark red or brown, though some fresh blood may be seen intermittently, especially after irrigation.  Do not blow you nose, as bleeding may occur. If you must sneeze, keep your mouth open to allow air to escape through your mouth. ° °If heavy bleeding occurs: Irrigate the nose with saline to rinse out clots, then spray the nose 3 - 4 times with Afrin Nasal Decongestant Spray.  The spray will constrict the blood vessels to slow bleeding.  Pinch the lower half of your nose shut to apply pressure, and lay down with your head elevated.  Ice packs over the nose may help as well. If bleeding persists despite these measures, you should notify your doctor.  Do not use the Afrin routinely to control nasal congestion after surgery, as it can result in worsening congestion and may affect healing.  ° ° ° °Activity: Return to work varies among patients. Most patients will be   out of work at least 5 - 7 days to recover.  Patient may return to work after they are off of narcotic pain medication, and feeling well enough to perform the functions of their job.  Patients must avoid heavy lifting (over 10 pounds) or strenuous physical for 2 weeks after surgery, so your employer may need to assign you to light duty, or keep you out of work longer if light duty is not possible.  NOTE: you should not drive, operate dangerous machinery, do any mentally demanding tasks or make any important legal or financial decisions while on narcotic pain medication and recovering from the general anesthetic.  °  °Call Your Doctor Immediately if You Have Any of the Following: °1. Bleeding that you cannot control with the above measures °2. Loss of vision, double vision, bulging of the eye or black eyes. °3. Fever over 101 degrees °4. Neck stiffness with  severe headache, fever, nausea and change in mental state. °You are always encourage to call anytime with concerns, however, please call with requests for pain medication refills during office hours. ° °Office Endoscopy: During follow-up visits your doctor will remove any packing or splints that may have been placed and evaluate and clean your sinuses endoscopically.  Topical anesthetic will be used to make this as comfortable as possible, though you may want to take your pain medication prior to the visit.  How often this will need to be done varies from patient to patient.  After complete recovery from the surgery, you may need follow-up endoscopy from time to time, particularly if there is concern of recurrent infection or nasal polyps. ° ° °General Anesthesia, Adult, Care After °These instructions provide you with information about caring for yourself after your procedure. Your health care provider may also give you more specific instructions. Your treatment has been planned according to current medical practices, but problems sometimes occur. Call your health care provider if you have any problems or questions after your procedure. °What can I expect after the procedure? °After the procedure, it is common to have: °· Vomiting. °· A sore throat. °· Mental slowness. ° °It is common to feel: °· Nauseous. °· Cold or shivery. °· Sleepy. °· Tired. °· Sore or achy, even in parts of your body where you did not have surgery. ° °Follow these instructions at home: °For at least 24 hours after the procedure: °· Do not: °? Participate in activities where you could fall or become injured. °? Drive. °? Use heavy machinery. °? Drink alcohol. °? Take sleeping pills or medicines that cause drowsiness. °? Make important decisions or sign legal documents. °? Take care of children on your own. °· Rest. °Eating and drinking °· If you vomit, drink water, juice, or soup when you can drink without vomiting. °· Drink enough fluid to  keep your urine clear or pale yellow. °· Make sure you have little or no nausea before eating solid foods. °· Follow the diet recommended by your health care provider. °General instructions °· Have a responsible adult stay with you until you are awake and alert. °· Return to your normal activities as told by your health care provider. Ask your health care provider what activities are safe for you. °· Take over-the-counter and prescription medicines only as told by your health care provider. °· If you smoke, do not smoke without supervision. °· Keep all follow-up visits as told by your health care provider. This is important. °Contact a health care provider if: °· You   continue to have nausea or vomiting at home, and medicines are not helpful. °· You cannot drink fluids or start eating again. °· You cannot urinate after 8-12 hours. °· You develop a skin rash. °· You have fever. °· You have increasing redness at the site of your procedure. °Get help right away if: °· You have difficulty breathing. °· You have chest pain. °· You have unexpected bleeding. °· You feel that you are having a life-threatening or urgent problem. °This information is not intended to replace advice given to you by your health care provider. Make sure you discuss any questions you have with your health care provider. °Document Released: 07/28/2000 Document Revised: 09/24/2015 Document Reviewed: 04/05/2015 °Elsevier Interactive Patient Education © 2018 [employer] ° °

## 2017-05-19 NOTE — Anesthesia Preprocedure Evaluation (Addendum)
Anesthesia Evaluation  Patient identified by MRN, date of birth, ID band Patient awake    Reviewed: Allergy & Precautions, NPO status , Patient's Chart, lab work & pertinent test results, reviewed documented beta blocker date and time   Airway Mallampati: II  TM Distance: >3 FB Neck ROM: Full    Dental no notable dental hx.    Pulmonary Current Smoker,    Pulmonary exam normal breath sounds clear to auscultation       Cardiovascular hypertension, Normal cardiovascular exam Rhythm:Regular Rate:Normal     Neuro/Psych negative neurological ROS     GI/Hepatic Neg liver ROS, GERD  ,Crohn's on Humira until February, had shingles, now on budesonide since prednisone caused osteoporosis   Endo/Other  negative endocrine ROS  Renal/GU negative Renal ROS     Musculoskeletal negative musculoskeletal ROS (+)   Abdominal Normal abdominal exam  (+)  Abdomen: soft.    Peds  Hematology negative hematology ROS (+)   Anesthesia Other Findings   Reproductive/Obstetrics                           Anesthesia Physical Anesthesia Plan  ASA: III  Anesthesia Plan: General   Post-op Pain Management:    Induction: Intravenous  PONV Risk Score and Plan:   Airway Management Planned: Oral ETT  Additional Equipment: None  Intra-op Plan: Utilization of Controlled Hypotension per surrgeon request  Post-operative Plan: Extubation in OR  Informed Consent: I have reviewed the patients History and Physical, chart, labs and discussed the procedure including the risks, benefits and alternatives for the proposed anesthesia with the patient or authorized representative who has indicated his/her understanding and acceptance.     Plan Discussed with: CRNA, Anesthesiologist and Surgeon  Anesthesia Plan Comments:        Anesthesia Quick Evaluation   Case had been cancelled last year for HTN Pt saw PCP for  elevated BP - per notes BP now improved

## 2017-05-21 ENCOUNTER — Encounter
Admission: RE | Disposition: A | Discharge status: Home / Self Care | Payer: Self-pay | Source: Ambulatory Visit | Attending: Otolaryngology

## 2017-05-21 ENCOUNTER — Ambulatory Visit: Payer: Medicare PPO | Admitting: Anesthesiology

## 2017-05-21 ENCOUNTER — Ambulatory Visit
Admission: RE | Admit: 2017-05-21 | Discharge: 2017-05-21 | Disposition: A | Discharge status: Home / Self Care | Payer: Medicare PPO | Source: Ambulatory Visit | Attending: Otolaryngology | Admitting: Otolaryngology

## 2017-05-21 DIAGNOSIS — Z8619 Personal history of other infectious and parasitic diseases: Secondary | ICD-10-CM | POA: Insufficient documentation

## 2017-05-21 DIAGNOSIS — Z85828 Personal history of other malignant neoplasm of skin: Secondary | ICD-10-CM | POA: Insufficient documentation

## 2017-05-21 DIAGNOSIS — M818 Other osteoporosis without current pathological fracture: Secondary | ICD-10-CM | POA: Insufficient documentation

## 2017-05-21 DIAGNOSIS — J320 Chronic maxillary sinusitis: Secondary | ICD-10-CM | POA: Diagnosis not present

## 2017-05-21 DIAGNOSIS — J342 Deviated nasal septum: Secondary | ICD-10-CM | POA: Insufficient documentation

## 2017-05-21 DIAGNOSIS — K219 Gastro-esophageal reflux disease without esophagitis: Secondary | ICD-10-CM | POA: Insufficient documentation

## 2017-05-21 DIAGNOSIS — T445X5D Adverse effect of predominantly beta-adrenoreceptor agonists, subsequent encounter: Secondary | ICD-10-CM | POA: Insufficient documentation

## 2017-05-21 DIAGNOSIS — I10 Essential (primary) hypertension: Secondary | ICD-10-CM | POA: Insufficient documentation

## 2017-05-21 DIAGNOSIS — K5090 Crohn's disease, unspecified, without complications: Secondary | ICD-10-CM | POA: Insufficient documentation

## 2017-05-21 DIAGNOSIS — Z79899 Other long term (current) drug therapy: Secondary | ICD-10-CM | POA: Diagnosis not present

## 2017-05-21 HISTORY — PX: MAXILLARY ANTROSTOMY: SHX2003

## 2017-05-21 HISTORY — PX: SEPTOPLASTY: SHX2393

## 2017-05-21 HISTORY — PX: IMAGE GUIDED SINUS SURGERY: SHX6570

## 2017-05-21 SURGERY — SINUS SURGERY, WITH IMAGING GUIDANCE
Anesthesia: General | Site: Nose | Laterality: Right | Wound class: Clean Contaminated

## 2017-05-21 MED ORDER — FENTANYL CITRATE (PF) 100 MCG/2ML IJ SOLN: 25.0000 ug | INTRAMUSCULAR | Status: DC | PRN

## 2017-05-21 MED ORDER — FENTANYL CITRATE (PF) 100 MCG/2ML IJ SOLN
INTRAMUSCULAR | Status: DC | PRN
  Administered 2017-05-21: 50 ug via INTRAVENOUS
  Administered 2017-05-21: 12.5 ug via INTRAVENOUS

## 2017-05-21 MED ORDER — EPHEDRINE SULFATE 50 MG/ML IJ SOLN
INTRAMUSCULAR | Status: DC | PRN
  Administered 2017-05-21: 10 mg via INTRAVENOUS
  Administered 2017-05-21 (×5): 5 mg via INTRAVENOUS

## 2017-05-21 MED ORDER — PROPOFOL 10 MG/ML IV BOLUS
INTRAVENOUS | Status: DC | PRN
  Administered 2017-05-21: 140 mg via INTRAVENOUS

## 2017-05-21 MED ORDER — ROCURONIUM BROMIDE 100 MG/10ML IV SOLN
INTRAVENOUS | Status: DC | PRN
  Administered 2017-05-21: 25 mg via INTRAVENOUS

## 2017-05-21 MED ORDER — ONDANSETRON HCL 4 MG/2ML IJ SOLN
INTRAMUSCULAR | Status: DC | PRN
  Administered 2017-05-21: 4 mg via INTRAVENOUS

## 2017-05-21 MED ORDER — LIDOCAINE-EPINEPHRINE 1 %-1:100000 IJ SOLN
INTRAMUSCULAR | Status: DC | PRN
  Administered 2017-05-21: 7 mL

## 2017-05-21 MED ORDER — CEFAZOLIN SODIUM-DEXTROSE 2-4 GM/100ML-% IV SOLN
2.0000 g | Freq: Once | INTRAVENOUS | Status: AC
  Administered 2017-05-21: 2 g via INTRAVENOUS

## 2017-05-21 MED ORDER — GLYCOPYRROLATE 0.2 MG/ML IJ SOLN
INTRAMUSCULAR | Status: DC | PRN
  Administered 2017-05-21: 0.1 mg via INTRAVENOUS

## 2017-05-21 MED ORDER — MIDAZOLAM HCL 5 MG/5ML IJ SOLN
INTRAMUSCULAR | Status: DC | PRN
  Administered 2017-05-21: 2 mg via INTRAVENOUS

## 2017-05-21 MED ORDER — DEXAMETHASONE SODIUM PHOSPHATE 4 MG/ML IJ SOLN
INTRAMUSCULAR | Status: DC | PRN
  Administered 2017-05-21: 10 mg via INTRAVENOUS

## 2017-05-21 MED ORDER — OXYCODONE HCL 5 MG/5ML PO SOLN: 5.0000 mg | Freq: Once | ORAL | Status: AC | PRN

## 2017-05-21 MED ORDER — LACTATED RINGERS IV SOLN
1000.0000 mL | INTRAVENOUS | Status: DC
  Administered 2017-05-21: 12:00:00 via INTRAVENOUS
  Administered 2017-05-21: 1000 mL via INTRAVENOUS

## 2017-05-21 MED ORDER — OXYMETAZOLINE HCL 0.05 % NA SOLN
2.0000 | Freq: Once | NASAL | Status: AC
  Administered 2017-05-21: 2 via NASAL

## 2017-05-21 MED ORDER — OXYCODONE HCL 5 MG PO TABS
5.0000 mg | ORAL_TABLET | Freq: Once | ORAL | Status: AC | PRN
  Administered 2017-05-21: 5 mg via ORAL

## 2017-05-21 MED ORDER — PHENYLEPHRINE HCL 0.5 % NA SOLN
NASAL | Status: DC | PRN
  Administered 2017-05-21: 30 mL

## 2017-05-21 MED ORDER — LIDOCAINE HCL (CARDIAC) 20 MG/ML IV SOLN
INTRAVENOUS | Status: DC | PRN
  Administered 2017-05-21: 40 mg via INTRAVENOUS

## 2017-05-21 SURGICAL SUPPLY — 35 items
BATTERY INSTRU NAVIGATION (MISCELLANEOUS) ×12 IMPLANT
CANISTER SUCT 1200ML W/VALVE (MISCELLANEOUS) ×4 IMPLANT
CATH IV 18X1 1/4 SAFELET (CATHETERS) ×4 IMPLANT
COAGULATOR SUCT 8FR VV (MISCELLANEOUS) ×4 IMPLANT
DRAPE HEAD BAR (DRAPES) ×4 IMPLANT
ELECT REM PT RETURN 9FT ADLT (ELECTROSURGICAL) ×4
ELECTRODE REM PT RTRN 9FT ADLT (ELECTROSURGICAL) ×3 IMPLANT
GLOVE PI ULTRA LF STRL 7.5 (GLOVE) ×12 IMPLANT
GLOVE PI ULTRA NON LATEX 7.5 (GLOVE) ×4
IV CATH 18X1 1/4 SAFELET (CATHETERS) ×3
IV NS 500ML (IV SOLUTION) ×1
IV NS 500ML BAXH (IV SOLUTION) ×3 IMPLANT
KIT ROOM TURNOVER OR (KITS) ×4 IMPLANT
NEEDLE ANESTHESIA  27G X 3.5 (NEEDLE) ×1
NEEDLE ANESTHESIA 27G X 3.5 (NEEDLE) ×3 IMPLANT
NEEDLE SPNL 25GX3.5 QUINCKE BL (NEEDLE) ×4 IMPLANT
NS IRRIG 500ML POUR BTL (IV SOLUTION) ×4 IMPLANT
PACK DRAPE NASAL/ENT (PACKS) ×4 IMPLANT
PACKING NASAL EPIS 4X2.4 XEROG (MISCELLANEOUS) IMPLANT
PATTIES SURGICAL .5 X3 (DISPOSABLE) ×4 IMPLANT
SHAVER DIEGO BLD STD TYPE A (BLADE) IMPLANT
SOL ANTI-FOG 6CC FOG-OUT (MISCELLANEOUS) ×3 IMPLANT
SOL FOG-OUT ANTI-FOG 6CC (MISCELLANEOUS) ×1
SPLINT NASAL SEPTAL BLV .50 ST (MISCELLANEOUS) ×4 IMPLANT
STRAP BODY AND KNEE 60X3 (MISCELLANEOUS) ×4 IMPLANT
SUT CHROMIC 3-0 (SUTURE) ×1
SUT CHROMIC 3-0 KS 27XMFL CR (SUTURE) ×3
SUT ETHILON 3-0 KS 30 BLK (SUTURE) ×4 IMPLANT
SUT PLAIN GUT 4-0 (SUTURE) ×4 IMPLANT
SUTURE CHRMC 3-0 KS 27XMFL CR (SUTURE) ×3 IMPLANT
SYR 3ML LL SCALE MARK (SYRINGE) ×4 IMPLANT
TOWEL OR 17X26 4PK STRL BLUE (TOWEL DISPOSABLE) ×4 IMPLANT
TRACKER CRANIALMASK (MASK) ×4 IMPLANT
TUBING DECLOG MULTIDEBRIDER (TUBING) ×4 IMPLANT
WATER STERILE IRR 250ML POUR (IV SOLUTION) ×4 IMPLANT

## 2017-05-21 NOTE — Op Note (Signed)
05/21/2017  12:24 PM  [id number]   Pre-Op Dx:  Deviated Nasal Septum, chronic right maxillary sinusitis, oro-nasal fistula healing post trauma  Post-op Dx: Deviated nasal septum, chronic right maxillary sinusitis, oral nasal fistula now healed  Proc: Nasal Septoplasty, right maxillary endoscopic antrostomy, outfracture inferior turbinates, use of the image guided system  Surg:  Elon Alas Jeannelle Wiens  Anes:  GOT  EBL: 50 mL  Comp: None  Findings: The oronasal fistula previously noted has now healed completely across.  There is no crusting or inflammation in the posterior floor nose.  There is scar tissue in the roof of the mouth right at the distal end of the hard palate.  Septum had spurs posteriorly on both sides that were blocking the airway.  The right maxillary antrum was obstructed and now is wide open.  There was thick mucus inside the nose on the right side only.  Procedure: With the patient in a comfortable supine position,  general orotracheal anesthesia was induced without difficulty.     The patient received preoperative Afrin spray for topical decongestion and vasoconstriction.  Intravenous prophylactic antibiotics were administered.  At an appropriate level, the patient was placed in a semi-sitting position.  Nasal vibrissae were trimmed.   1% Xylocaine with 1:100,000 epinephrine, 6 cc's, was infiltrated into the anterior floor of the nose, into the nasal spine region, into the membranous columella, and finally into the submucoperichondrial plane of the septum on both sides.  Several minutes were allowed for this to take effect.  Cottoniod pledgetts soaked in Afrin and 4% Xylocaine were placed into both nasal cavities and left while the patient was prepped and draped in the standard fashion.  The image guidance system was brought in and the CT scan was downloaded to the disc.  The template was applied to the face.  The template was then registered in the system.  The first time we  did it the registration for my office.  We had to redo the entire registration but this time it worked well and there was 0.5 mm of variance.  The suction instruments were then registered in the system and these aligned perfectly with the image guided system when checking anatomy.  The materials were removed from the nose and observed to be intact and correct in number.  The nose was inspected with a headlight and a 0 degree scope with the findings as described above.  The scope was used to visualize the floor of the right nasal passage all the way into the nasopharynx.  There was no hole of the bottom of the nose and no evidence of any scarring or scabbing.  I looked inside the mouth and you could see the scarring over the previous hole but no evidence of any drainage or problems there.  Small amount of yeast was growing on the palate but otherwise the area now appears to be completely healed over.  A left Killian incision was sharply executed and carried down to the quadrangular cartilage. The mucoperichondrium was elelvated along the quadrangular plate back to the bony-cartilaginous junction. The mucoperiostium was then elevated along the ethmoid plate and the vomer. The boney-catilaginous junction was then split with a freer elevator and the mucoperiosteum was elevated on the opposite side. The mucoperiosteum was then elevated along the maxillary crest as needed to expose the crooked bone of the crest.  Boney spurs of the vomer and maxillary crest were removed with Donavan Foil forceps.  The cartilaginous plate was trimmed along its posterior  and inferior borders of about 2 mm of cartilage to free it up inferiorly. Some of the deviated ethmoid plate was then fractured and removed with Takahashi forceps to free up the posterior border of the quadrangular plate and allow it to swing back to the midline. The mucosal flaps were placed back into their anatomic position to allow visualization of the airways. The  septum now sat in the midline with an improved airway.  A 3-0 Chromic suture on a Keith needle in used to anchor the inferior septum at the nasal spine with a through and through suture. The mucosal flaps are then sutured together using a through and through whip stitch of 4-0 Plain Gut with a mini-Keith needle.  This was used to close the Dukedom incision as well.   The inferior turbinates were then inspected.  These were partially blocking the lower airways on both sides.  These were outfractured help open up the airway some and to give better visualization on the right side.  The 0 degree scope was used for visualizing the airway on the right and the middle turbinate was infractured.  The uncinate process was then incised using a side biting forcep to open up the natural ostium.  Through biting 45 degree forceps were used to help trim some of the uncinate process and then the [facility] microdebrider was used for trimming the uncinate process all the way up to its superior border.  The Degele was used to help widen the natural ostium more posteriorly and inferiorly.  The 30 and 70 degree scopes were used to visualize the right side and make sure that the maxillary sinus is wide open.  Small amount of tissue was taken at its anterior border to make sure that the natural ostium was occluded and very widely open.  There was some mucus bottom of the maxillary sinus that was suctioned out but there is no sign of any polyps or lesions.  The airways were then visualized and showed open passageways on both sides that were significantly improved compared to before surgery. There was no signifcant bleeding. Nasal splints were applied to both sides of the septum using Xomed 0.82m regular sized splints that were trimmed, and then held in position with a 3-0 Nylon through and through suture.  0 gel was placed in the right middle meatus to fill this area to help prevent scabbing.  It was wet and not feeling in the airway.   The airways were clear on both sides and the Xomed splints were not touching the middle turbinates.  The patient was turned back over to anesthesia, and awakened, extubated, and taken to the PACU in satisfactory condition.  Dispo:   PACU to home  Plan: Ice, elevation, narcotic analgesia, steroid taper, and prophylactic antibiotics for the duration of indwelling nasal foreign bodies.  We will reevaluate the patient in the office in 6 days and remove the septal splints.  Return to work in 10 days, strenuous activities in two weeks.   PElon AlasJuengel 05/21/2017 12:24 PM

## 2017-05-21 NOTE — Transfer of Care (Signed)
Immediate Anesthesia Transfer of Care Note  Patient: Brent Diaz  Procedure(s) Performed: IMAGE GUIDED SINUS SURGERY (N/A Nose) SEPTOPLASTY (N/A Nose) MAXILLARY ANTROSTOMY (Right Nose) biopsy hard palate lesion (N/A Nose)  Patient Location: PACU  Anesthesia Type: General  Level of Consciousness: awake, alert  and patient cooperative  Airway and Oxygen Therapy: Patient Spontanous Breathing and Patient connected to supplemental oxygen  Post-op Assessment: Post-op Vital signs reviewed, Patient's Cardiovascular Status Stable, Respiratory Function Stable, Patent Airway and No signs of Nausea or vomiting  Post-op Vital Signs: Reviewed and stable  Complications: No apparent anesthesia complications

## 2017-05-21 NOTE — H&P (Signed)
H&P has been reviewedand patient reevaluated,  and no changes necessary. To be downloaded later.

## 2017-05-21 NOTE — Anesthesia Postprocedure Evaluation (Signed)
Anesthesia Post Note  Patient: MATIAS THURMAN  Procedure(s) Performed: IMAGE GUIDED SINUS SURGERY (N/A Nose) SEPTOPLASTY (N/A Nose) MAXILLARY ANTROSTOMY (Right Nose)  Patient location during evaluation: PACU Anesthesia Type: General Level of consciousness: awake Pain management: pain level controlled Vital Signs Assessment: post-procedure vital signs reviewed and stable Respiratory status: spontaneous breathing Cardiovascular status: blood pressure returned to baseline Postop Assessment: no headache Anesthetic complications: no Comments: Patient had difficult airway with Sabra Heck 2, intubation successful using Bougie    Lavonna Monarch

## 2017-05-21 NOTE — Anesthesia Procedure Notes (Signed)
Procedure Name: Intubation Date/Time: 05/21/2017 10:53 AM Performed by: Mayme Genta, CRNA Pre-anesthesia Checklist: Patient identified, Emergency Drugs available, Suction available, Patient being monitored and Timeout performed Patient Re-evaluated:Patient Re-evaluated prior to induction Oxygen Delivery Method: Circle system utilized Preoxygenation: Pre-oxygenation with 100% oxygen Induction Type: IV induction Ventilation: Mask ventilation without difficulty Laryngoscope Size: Miller and 3 Grade View: Grade III Tube type: Oral Rae Tube size: 7.5 mm Number of attempts: 1 Airway Equipment and Method: Bougie stylet Placement Confirmation: ETT inserted through vocal cords under direct vision,  positive ETCO2 and breath sounds checked- equal and bilateral Tube secured with: Tape Dental Injury: Teeth and Oropharynx as per pre-operative assessment  Difficulty Due To: Difficulty was anticipated and Difficult Airway- due to anterior larynx Comments: Intubated with boujie, DL x 1. Very Anterior Larynx noted. Tolerated well by pt.

## 2017-05-25 LAB — SURGICAL PATHOLOGY

## 2017-09-26 ENCOUNTER — Encounter: Payer: Self-pay | Admitting: Gynecology

## 2017-09-26 ENCOUNTER — Ambulatory Visit
Admission: EM | Admit: 2017-09-26 | Discharge: 2017-09-26 | Disposition: A | Discharge status: Home / Self Care | Payer: Medicare PPO | Attending: Family Medicine | Admitting: Family Medicine

## 2017-09-26 ENCOUNTER — Other Ambulatory Visit: Payer: Self-pay

## 2017-09-26 DIAGNOSIS — W57XXXA Bitten or stung by nonvenomous insect and other nonvenomous arthropods, initial encounter: Secondary | ICD-10-CM | POA: Diagnosis not present

## 2017-09-26 DIAGNOSIS — S29011A Strain of muscle and tendon of front wall of thorax, initial encounter: Secondary | ICD-10-CM

## 2017-09-26 DIAGNOSIS — S70262A Insect bite (nonvenomous), left hip, initial encounter: Secondary | ICD-10-CM | POA: Diagnosis not present

## 2017-09-26 DIAGNOSIS — M5489 Other dorsalgia: Secondary | ICD-10-CM | POA: Diagnosis not present

## 2017-09-26 MED ORDER — DOXYCYCLINE HYCLATE 100 MG PO TABS: 100.0000 mg | ORAL_TABLET | Freq: Two times a day (BID) | ORAL | 0 refills | Status: DC

## 2017-09-26 MED ORDER — CYCLOBENZAPRINE HCL 10 MG PO TABS: 10.0000 mg | ORAL_TABLET | Freq: Every day | ORAL | 0 refills | Status: DC

## 2017-09-26 NOTE — ED Provider Notes (Signed)
[facility]    CSN: [id number] Arrival date & time: 09/26/17  1205     History   Chief Complaint Chief Complaint  Patient presents with  . Insect Bite  . Shoulder Pain  . Back Pain    HPI Brent Diaz is a 62 y.o. male.   62 yo male with a c/o right shoulder and back pain after working on roof last week. States he was doing some lifting of heavy objects but does not recall a specific injury. Also c/o a tick bite to his left hip.   The history is provided by the patient.  Shoulder Pain  Associated symptoms: back pain   Back Pain    Past Medical History:  Diagnosis Date  . Cancer ([place])    skin  . Crohn's disease ([place])   . GERD (gastroesophageal reflux disease)   . Hypertension   . Shingles    resolved  . Wears contact lenses    Left eye    Patient Active Problem List   Diagnosis Date Noted  . Eye swelling, right 06/30/2016    Past Surgical History:  Procedure Laterality Date  . ABDOMINAL SURGERY    . BOWEL RESECTION    . HYDROCELE EXCISION / REPAIR    . IMAGE GUIDED SINUS SURGERY N/A 05/21/2017   Procedure: IMAGE GUIDED SINUS SURGERY;  Surgeon: Margaretha Sheffield, MD;  Location: [place];  Service: ENT;  Laterality: N/A;  need stryker disk  . MAXILLARY ANTROSTOMY Right 05/21/2017   Procedure: MAXILLARY ANTROSTOMY;  Surgeon: Margaretha Sheffield, MD;  Location: [place];  Service: ENT;  Laterality: Right;  . SEPTOPLASTY N/A 05/21/2017   Procedure: SEPTOPLASTY;  Surgeon: Margaretha Sheffield, MD;  Location: [place];  Service: ENT;  Laterality: N/A;       Home Medications    Prior to Admission medications   Medication Sig Start Date End Date Taking? Authorizing Provider  albuterol (PROVENTIL HFA;VENTOLIN HFA) 108 (90 Base) MCG/ACT inhaler Inhale 2 puffs into the lungs every 6 (six) hours as needed for wheezing or shortness of breath.   Yes [provider]  B Complex-Folic Acid (B COMPLEX-VITAMIN B12 PO) Take 1,000 mcg  by mouth daily.    Yes [provider]  budesonide (ENTOCORT EC) 3 MG 24 hr capsule Take 9 mg by mouth daily.    Yes [provider]  Cephalexin 500 MG tablet Take 500 mg by mouth 2 (two) times daily.   Yes [provider]  fluticasone (FLONASE) 50 MCG/ACT nasal spray Place 2 sprays daily into both nostrils.   Yes [provider]  hyoscyamine (LEVSIN, ANASPAZ) 0.125 MG tablet Take 0.125 mg by mouth every 4 (four) hours as needed.   Yes [provider]  lisinopril-hydrochlorothiazide (PRINZIDE,ZESTORETIC) 20-12.5 MG tablet Take 1 tablet by mouth 2 (two) times daily.   Yes [provider]  Melatonin 5 MG TABS Take 10 mg by mouth at bedtime as needed.   Yes [provider]  montelukast (SINGULAIR) 10 MG tablet Take 10 mg at bedtime by mouth.   Yes [provider]  pantoprazole (PROTONIX) 40 MG tablet Take 40 mg by mouth 2 (two) times daily. 04/14/16  Yes [provider]  Probiotic Product (ALIGN) 4 MG CAPS Take 4 mg daily by mouth.   Yes [provider]  Vitamin D, Ergocalciferol, (DRISDOL) 50000 units CAPS capsule Take 50,000 Units by mouth every 7 (seven) days.   Yes [provider]  cyclobenzaprine (FLEXERIL)  10 MG tablet Take 1 tablet (10 mg total) by mouth at bedtime. 09/26/17   Norval Gable, MD  doxycycline (VIBRA-TABS) 100 MG tablet Take 1 tablet (100 mg total) by mouth 2 (two) times daily. 09/26/17   Norval Gable, MD  folic acid (FOLVITE) 1 MG tablet Take 1 mg by mouth daily. 06/21/16   [provider]  gabapentin (NEURONTIN) 800 MG tablet Take 300 mg by mouth 3 (three) times daily.     [provider]  gentamicin ointment (GARAMYCIN) 0.1 % Apply 1 application as needed topically.    [provider]  lisinopril (PRINIVIL,ZESTRIL) 10 MG tablet Take 10 mg daily by mouth.    [provider]  loratadine (CLARITIN) 10 MG tablet Take 10 mg by mouth daily.     [provider]  promethazine (PHENERGAN) 25 MG tablet Take 25 mg by mouth every 6 (six) hours as needed for nausea or vomiting.    [provider]  tobramycin (TOBREX) 0.3 % ophthalmic solution every 4 (four) hours. Use in sinus rinse    [provider]  valACYclovir (VALTREX) 500 MG tablet Take 500 mg 2 (two) times daily by mouth.    [provider]  zolpidem (AMBIEN) 10 MG tablet Take 10 mg by mouth at bedtime as needed for sleep.    [provider]    Family History History reviewed. No pertinent family history.  Social History Social History   Tobacco Use  . Smoking status: Current Every Day Smoker    Packs/day: 0.50    Years: 47.00    Pack years: 23.50    Types: Cigarettes  . Smokeless tobacco: Never Used  . Tobacco comment: since age 50  Substance Use Topics  . Alcohol use: No  . Drug use: Never     Allergies   Amlodipine; Aspirin; and Flagyl [metronidazole]   Review of Systems Review of Systems  Musculoskeletal: Positive for back pain.     Physical Exam Triage Vital Signs ED Triage Vitals  Enc Vitals Group     BP 09/26/17 1219 (!) 130/108     Pulse Rate 09/26/17 1219 (!) 105     Resp 09/26/17 1219 18     Temp 09/26/17 1219 98.2 F (36.8 C)     Temp Source 09/26/17 1219 Oral     SpO2 09/26/17 1219 98 %     Weight 09/26/17 1218 175 lb (79.4 kg)     Height --      Head Circumference --      Peak Flow --      Pain Score 09/26/17 1218 7     Pain Loc --      Pain Edu? --      Excl. in [place]? --    No data found.  Updated Vital Signs BP (!) 130/108 (BP Location: Left Arm)   Pulse (!) 105   Temp 98.2 F (36.8 C) (Oral)   Resp 18   Wt 175 lb (79.4 kg)   SpO2 98%   BMI 25.11 kg/m   Visual Acuity Right Eye Distance:   Left Eye Distance:   Bilateral Distance:    Right Eye Near:   Left Eye Near:    Bilateral Near:     Physical Exam  Constitutional: He appears well-developed and well-nourished. No  distress.  Cardiovascular: Normal rate, regular rhythm and normal heart sounds.  Pulmonary/Chest: Breath sounds normal. No stridor. No respiratory distress. He has no wheezes. He exhibits tenderness (over right lateral  ribs).  Musculoskeletal: Normal range of motion.       Right shoulder: He exhibits tenderness (over the detltoid) and spasm. He exhibits normal range of motion, no bony tenderness, no swelling, no effusion, no crepitus, no deformity, no laceration, no pain, normal pulse and normal strength.       Cervical back: He exhibits spasm. He exhibits normal range of motion, no bony tenderness, no swelling, no edema, no deformity, no laceration, no pain and normal pulse. Tenderness: over the trapezius muscle.  Skin: He is not diaphoretic.  2cm erythematous lesion on left hip area with pinpoint puncture wound; no drainage  Nursing note and vitals reviewed.    UC Treatments / Results  Labs (all labs ordered are listed, but only abnormal results are displayed) Labs Reviewed - No data to display  EKG None  Radiology No results found.  Procedures Procedures (including critical care time)  Medications Ordered in UC Medications - No data to display  Initial Impression / Assessment and Plan / UC Course  I have reviewed the triage vital signs and the nursing notes.  Pertinent labs & imaging results that were available during my care of the patient were reviewed by me and considered in my medical decision making (see chart for details).      Final Clinical Impressions(s) / UC Diagnoses   Final diagnoses:  Muscle strain of chest wall, initial encounter  Tick bite, initial encounter   Discharge Instructions   None    ED Prescriptions    Medication Sig Dispense Auth. Provider   cyclobenzaprine (FLEXERIL) 10 MG tablet Take 1 tablet (10 mg total) by mouth at bedtime. 30 tablet Norval Gable, MD   doxycycline (VIBRA-TABS) 100 MG tablet Take 1 tablet (100 mg total) by mouth 2  (two) times daily. 20 tablet Norval Gable, MD      1. diagnosis reviewed with patient 2. rx as per orders above; reviewed possible side effects, interactions, risks and benefits  3. Recommend supportive treatment with ice, rest 4. Follow-up prn if symptoms worsen or don't improve Controlled Substance Prescriptions [place] Controlled Substance Registry consulted? Not Applicable   Norval Gable, MD 09/26/17 [id number]

## 2017-09-26 NOTE — ED Triage Notes (Signed)
Per patient c/o tick bite x 3 days ago. Patient also c/o right shoulder and back pain.

## 2017-10-03 DIAGNOSIS — I2699 Other pulmonary embolism without acute cor pulmonale: Secondary | ICD-10-CM

## 2017-10-03 HISTORY — DX: Other pulmonary embolism without acute cor pulmonale: I26.99

## 2017-10-08 ENCOUNTER — Other Ambulatory Visit: Payer: Self-pay | Admitting: Unknown Physician Specialty

## 2017-10-08 DIAGNOSIS — D72829 Elevated white blood cell count, unspecified: Secondary | ICD-10-CM

## 2017-10-08 DIAGNOSIS — K50919 Crohn's disease, unspecified, with unspecified complications: Secondary | ICD-10-CM

## 2017-10-14 ENCOUNTER — Other Ambulatory Visit: Payer: Self-pay

## 2017-10-14 ENCOUNTER — Observation Stay
Admission: EM | Admit: 2017-10-14 | Discharge: 2017-10-15 | Disposition: A | Discharge status: Home / Self Care | Payer: Medicare PPO | Attending: Internal Medicine | Admitting: Internal Medicine

## 2017-10-14 ENCOUNTER — Ambulatory Visit
Admission: RE | Admit: 2017-10-14 | Discharge: 2017-10-14 | Disposition: A | Discharge status: Home / Self Care | Payer: Medicare PPO | Source: Ambulatory Visit | Attending: Unknown Physician Specialty | Admitting: Unknown Physician Specialty

## 2017-10-14 DIAGNOSIS — K5732 Diverticulitis of large intestine without perforation or abscess without bleeding: Secondary | ICD-10-CM | POA: Diagnosis not present

## 2017-10-14 DIAGNOSIS — F17210 Nicotine dependence, cigarettes, uncomplicated: Secondary | ICD-10-CM | POA: Insufficient documentation

## 2017-10-14 DIAGNOSIS — I10 Essential (primary) hypertension: Secondary | ICD-10-CM | POA: Insufficient documentation

## 2017-10-14 DIAGNOSIS — I700 Atherosclerosis of aorta: Secondary | ICD-10-CM | POA: Diagnosis not present

## 2017-10-14 DIAGNOSIS — K5792 Diverticulitis of intestine, part unspecified, without perforation or abscess without bleeding: Secondary | ICD-10-CM | POA: Diagnosis present

## 2017-10-14 DIAGNOSIS — I2699 Other pulmonary embolism without acute cor pulmonale: Secondary | ICD-10-CM

## 2017-10-14 DIAGNOSIS — Z886 Allergy status to analgesic agent status: Secondary | ICD-10-CM | POA: Insufficient documentation

## 2017-10-14 DIAGNOSIS — K760 Fatty (change of) liver, not elsewhere classified: Secondary | ICD-10-CM | POA: Insufficient documentation

## 2017-10-14 DIAGNOSIS — R9431 Abnormal electrocardiogram [ECG] [EKG]: Secondary | ICD-10-CM | POA: Insufficient documentation

## 2017-10-14 DIAGNOSIS — K50914 Crohn's disease, unspecified, with abscess: Secondary | ICD-10-CM | POA: Diagnosis not present

## 2017-10-14 DIAGNOSIS — G629 Polyneuropathy, unspecified: Secondary | ICD-10-CM | POA: Insufficient documentation

## 2017-10-14 DIAGNOSIS — Z881 Allergy status to other antibiotic agents status: Secondary | ICD-10-CM | POA: Insufficient documentation

## 2017-10-14 DIAGNOSIS — J439 Emphysema, unspecified: Secondary | ICD-10-CM | POA: Insufficient documentation

## 2017-10-14 DIAGNOSIS — D72829 Elevated white blood cell count, unspecified: Secondary | ICD-10-CM | POA: Insufficient documentation

## 2017-10-14 DIAGNOSIS — Z888 Allergy status to other drugs, medicaments and biological substances status: Secondary | ICD-10-CM | POA: Insufficient documentation

## 2017-10-14 DIAGNOSIS — Z79899 Other long term (current) drug therapy: Secondary | ICD-10-CM | POA: Insufficient documentation

## 2017-10-14 DIAGNOSIS — K219 Gastro-esophageal reflux disease without esophagitis: Secondary | ICD-10-CM | POA: Insufficient documentation

## 2017-10-14 DIAGNOSIS — K50919 Crohn's disease, unspecified, with unspecified complications: Secondary | ICD-10-CM | POA: Insufficient documentation

## 2017-10-14 DIAGNOSIS — K4090 Unilateral inguinal hernia, without obstruction or gangrene, not specified as recurrent: Secondary | ICD-10-CM | POA: Diagnosis not present

## 2017-10-14 LAB — BASIC METABOLIC PANEL
ANION GAP: 10 (ref 5–15)
BUN: 8 mg/dL (ref 6–20)
CALCIUM: 9 mg/dL (ref 8.9–10.3)
CO2: 25 mmol/L (ref 22–32)
Chloride: 107 mmol/L (ref 101–111)
Creatinine, Ser: 0.97 mg/dL (ref 0.61–1.24)
Glucose, Bld: 100 mg/dL — ABNORMAL HIGH (ref 65–99)
Potassium: 4.1 mmol/L (ref 3.5–5.1)
Sodium: 142 mmol/L (ref 135–145)

## 2017-10-14 LAB — CBC WITH DIFFERENTIAL/PLATELET
BAND NEUTROPHILS: 2 %
BASOS ABS: 0 10*3/uL (ref 0–0.1)
BLASTS: 0 %
Basophils Relative: 0 %
EOS ABS: 0 10*3/uL (ref 0–0.7)
Eosinophils Relative: 0 %
HEMATOCRIT: 46.9 % (ref 40.0–52.0)
Hemoglobin: 15.6 g/dL (ref 13.0–18.0)
Lymphocytes Relative: 15 %
Lymphs Abs: 2.9 10*3/uL (ref 1.0–3.6)
MCH: 32.1 pg (ref 26.0–34.0)
MCHC: 33.3 g/dL (ref 32.0–36.0)
MCV: 96.2 fL (ref 80.0–100.0)
METAMYELOCYTES PCT: 2 %
MONOS PCT: 10 %
MYELOCYTES: 1 %
Monocytes Absolute: 1.9 10*3/uL — ABNORMAL HIGH (ref 0.2–1.0)
NEUTROS ABS: 14.2 10*3/uL — AB (ref 1.4–6.5)
Neutrophils Relative %: 70 %
Other: 0 %
PLATELETS: 241 10*3/uL (ref 150–440)
PROMYELOCYTES RELATIVE: 0 %
RBC: 4.88 MIL/uL (ref 4.40–5.90)
RDW: 14.8 % — AB (ref 11.5–14.5)
WBC: 19 10*3/uL — ABNORMAL HIGH (ref 3.8–10.6)
nRBC: 0 /100 WBC

## 2017-10-14 LAB — BRAIN NATRIURETIC PEPTIDE: B Natriuretic Peptide: 174 pg/mL — ABNORMAL HIGH (ref 0.0–100.0)

## 2017-10-14 LAB — TROPONIN I

## 2017-10-14 MED ORDER — PIPERACILLIN-TAZOBACTAM 3.375 G IVPB 30 MIN
3.3750 g | Freq: Once | INTRAVENOUS | Status: AC
  Administered 2017-10-14: 3.375 g via INTRAVENOUS
  Filled 2017-10-14: qty 50

## 2017-10-14 MED ORDER — HYDROCHLOROTHIAZIDE 12.5 MG PO CAPS: 12.5000 mg | ORAL_CAPSULE | Freq: Every day | ORAL | Status: DC

## 2017-10-14 MED ORDER — IOPAMIDOL (ISOVUE-300) INJECTION 61%
100.0000 mL | Freq: Once | INTRAVENOUS | Status: AC | PRN
  Administered 2017-10-14: 100 mL via INTRAVENOUS

## 2017-10-14 MED ORDER — VALACYCLOVIR HCL 500 MG PO TABS
500.0000 mg | ORAL_TABLET | Freq: Two times a day (BID) | ORAL | Status: DC
  Filled 2017-10-14 (×3): qty 1

## 2017-10-14 MED ORDER — ZOLPIDEM TARTRATE 5 MG PO TABS: 10.0000 mg | ORAL_TABLET | Freq: Every evening | ORAL | Status: DC | PRN

## 2017-10-14 MED ORDER — CYCLOBENZAPRINE HCL 10 MG PO TABS
10.0000 mg | ORAL_TABLET | Freq: Every day | ORAL | Status: DC
  Filled 2017-10-14: qty 1

## 2017-10-14 MED ORDER — BUDESONIDE 3 MG PO CPEP
9.0000 mg | ORAL_CAPSULE | Freq: Every day | ORAL | Status: DC
  Administered 2017-10-15: 9 mg via ORAL
  Filled 2017-10-14: qty 3

## 2017-10-14 MED ORDER — ACETAMINOPHEN 325 MG PO TABS
650.0000 mg | ORAL_TABLET | Freq: Four times a day (QID) | ORAL | Status: DC | PRN
  Administered 2017-10-15: 650 mg via ORAL
  Filled 2017-10-14: qty 2

## 2017-10-14 MED ORDER — HYDROCHLOROTHIAZIDE 12.5 MG PO CAPS
12.5000 mg | ORAL_CAPSULE | Freq: Two times a day (BID) | ORAL | Status: DC
  Administered 2017-10-14 – 2017-10-15 (×2): 12.5 mg via ORAL
  Filled 2017-10-14 (×2): qty 1

## 2017-10-14 MED ORDER — MONTELUKAST SODIUM 10 MG PO TABS
10.0000 mg | ORAL_TABLET | Freq: Every day | ORAL | Status: DC
  Filled 2017-10-14: qty 1

## 2017-10-14 MED ORDER — PANTOPRAZOLE SODIUM 40 MG PO TBEC
40.0000 mg | DELAYED_RELEASE_TABLET | Freq: Two times a day (BID) | ORAL | Status: DC
  Administered 2017-10-14 – 2017-10-15 (×2): 40 mg via ORAL
  Filled 2017-10-14 (×2): qty 1

## 2017-10-14 MED ORDER — ALBUTEROL SULFATE (2.5 MG/3ML) 0.083% IN NEBU: 3.0000 mL | INHALATION_SOLUTION | Freq: Four times a day (QID) | RESPIRATORY_TRACT | Status: DC | PRN

## 2017-10-14 MED ORDER — ONDANSETRON HCL 4 MG PO TABS: 4.0000 mg | ORAL_TABLET | Freq: Four times a day (QID) | ORAL | Status: DC | PRN

## 2017-10-14 MED ORDER — PIPERACILLIN-TAZOBACTAM 3.375 G IVPB
3.3750 g | Freq: Three times a day (TID) | INTRAVENOUS | Status: DC
  Administered 2017-10-14: 3.375 g via INTRAVENOUS
  Filled 2017-10-14: qty 50

## 2017-10-14 MED ORDER — ONDANSETRON HCL 4 MG/2ML IJ SOLN: 4.0000 mg | Freq: Four times a day (QID) | INTRAMUSCULAR | Status: DC | PRN

## 2017-10-14 MED ORDER — APIXABAN 5 MG PO TABS
10.0000 mg | ORAL_TABLET | Freq: Two times a day (BID) | ORAL | Status: DC
  Administered 2017-10-15: 10 mg via ORAL
  Filled 2017-10-14: qty 2

## 2017-10-14 MED ORDER — HYOSCYAMINE SULFATE 0.125 MG PO TABS
0.1250 mg | ORAL_TABLET | ORAL | Status: DC | PRN
  Filled 2017-10-14: qty 1

## 2017-10-14 MED ORDER — FLUTICASONE PROPIONATE 50 MCG/ACT NA SUSP
2.0000 | Freq: Every day | NASAL | Status: DC
  Administered 2017-10-15: 2 via NASAL
  Filled 2017-10-14: qty 16

## 2017-10-14 MED ORDER — FOLIC ACID 1 MG PO TABS
1.0000 mg | ORAL_TABLET | Freq: Every day | ORAL | Status: DC
  Administered 2017-10-15: 1 mg via ORAL
  Filled 2017-10-14: qty 1

## 2017-10-14 MED ORDER — GABAPENTIN 600 MG PO TABS
300.0000 mg | ORAL_TABLET | Freq: Three times a day (TID) | ORAL | Status: DC
  Filled 2017-10-14 (×4): qty 0.5

## 2017-10-14 MED ORDER — APIXABAN 5 MG PO TABS
10.0000 mg | ORAL_TABLET | Freq: Once | ORAL | Status: AC
  Administered 2017-10-14: 10 mg via ORAL
  Filled 2017-10-14: qty 2

## 2017-10-14 MED ORDER — LISINOPRIL 20 MG PO TABS: 20.0000 mg | ORAL_TABLET | Freq: Every day | ORAL | Status: DC

## 2017-10-14 MED ORDER — LORATADINE 10 MG PO TABS
10.0000 mg | ORAL_TABLET | Freq: Every day | ORAL | Status: DC
  Administered 2017-10-15: 10 mg via ORAL
  Filled 2017-10-14: qty 1

## 2017-10-14 MED ORDER — APIXABAN 5 MG PO TABS: 5.0000 mg | ORAL_TABLET | Freq: Two times a day (BID) | ORAL | Status: DC

## 2017-10-14 MED ORDER — LISINOPRIL 20 MG PO TABS
20.0000 mg | ORAL_TABLET | Freq: Two times a day (BID) | ORAL | Status: DC
  Administered 2017-10-14 – 2017-10-15 (×2): 20 mg via ORAL
  Filled 2017-10-14 (×2): qty 1

## 2017-10-14 MED ORDER — ACETAMINOPHEN 650 MG RE SUPP: 650.0000 mg | Freq: Four times a day (QID) | RECTAL | Status: DC | PRN

## 2017-10-14 MED ORDER — LISINOPRIL-HYDROCHLOROTHIAZIDE 20-12.5 MG PO TABS: 1.0000 | ORAL_TABLET | Freq: Two times a day (BID) | ORAL | Status: DC

## 2017-10-14 NOTE — ED Notes (Addendum)
Patient transported to room 142 by this EDT.

## 2017-10-14 NOTE — ED Triage Notes (Signed)
To ER via POV c/o being called by his doctor to return to ER due to imaging that showed bilateral pulmonary emboli. Pt states that has had mild increase in SOB and mild weakness compared to his baseline. Pt alert and oriented X4, active, cooperative, pt in NAD. RR even and unlabored, color WNL.  Denies CP

## 2017-10-14 NOTE — ED Provider Notes (Signed)
[facility] Provider Note  ____________________________________________  Time seen: Approximately 7:20 PM  I have reviewed the triage vital signs and the nursing notes.   HISTORY  Chief Complaint Other ("Pulmonary Embolism")   HPI Brent Diaz is a 62 y.o. male with a history of Crohn's disease, hypertension, and GERD who presents for evaluation of new pulmonary embolism.  Patient went to see his GI doctor for Crohn's follow-up.  Had lab work done 5 days ago which showed elevated white count.  Patient went back today and the white count persisted so his doctor sent him for a CT chest abdomen pelvis.  The CT is positive for diverticulitis and acute pulmonary embolism with possible peripheral right lower lobe infarct.  Patient was called by his doctor and sent to the emergency room for evaluation.  Patient is completely asymptomatic.  He denies chest pain or shortness of breath, he denies fever or chills, he denies abdominal pain, nausea or vomiting.  Patient denies any prior history of blood clots.  He denies any recent travel or immobilization, no recent surgery.  He does have mild swelling of his bilateral ankles however that has been ongoing for several months.  He denies any known history of cancer.  Past Medical History:  Diagnosis Date  . Cancer ([place])    skin  . Crohn's disease ([place])   . GERD (gastroesophageal reflux disease)   . Hypertension   . Shingles    resolved  . Wears contact lenses    Left eye    Patient Active Problem List   Diagnosis Date Noted  . Eye swelling, right 06/30/2016    Past Surgical History:  Procedure Laterality Date  . ABDOMINAL SURGERY    . BOWEL RESECTION    . HYDROCELE EXCISION / REPAIR    . IMAGE GUIDED SINUS SURGERY N/A 05/21/2017   Procedure: IMAGE GUIDED SINUS SURGERY;  Surgeon: Margaretha Sheffield, MD;  Location: [facility];  Service: ENT;  Laterality: N/A;  need stryker disk  .  MAXILLARY ANTROSTOMY Right 05/21/2017   Procedure: MAXILLARY ANTROSTOMY;  Surgeon: Margaretha Sheffield, MD;  Location: [facility];  Service: ENT;  Laterality: Right;  . SEPTOPLASTY N/A 05/21/2017   Procedure: SEPTOPLASTY;  Surgeon: Margaretha Sheffield, MD;  Location: [facility];  Service: ENT;  Laterality: N/A;    Prior to Admission medications   Medication Sig Start Date End Date Taking? Authorizing Provider  albuterol (PROVENTIL HFA;VENTOLIN HFA) 108 (90 Base) MCG/ACT inhaler Inhale 2 puffs into the lungs every 6 (six) hours as needed for wheezing or shortness of breath.    [provider]  B Complex-Folic Acid (B COMPLEX-VITAMIN B12 PO) Take 1,000 mcg by mouth daily.     [provider]  budesonide (ENTOCORT EC) 3 MG 24 hr capsule Take 9 mg by mouth daily.     [provider]  Cephalexin 500 MG tablet Take 500 mg by mouth 2 (two) times daily.    [provider]  cyclobenzaprine (FLEXERIL) 10 MG tablet Take 1 tablet (10 mg total) by mouth at bedtime. 09/26/17   Norval Gable, MD  doxycycline (VIBRA-TABS) 100 MG tablet Take 1 tablet (100 mg total) by mouth 2 (two) times daily. 09/26/17   Norval Gable, MD  fluticasone (FLONASE) 50 MCG/ACT nasal spray Place 2 sprays daily into both nostrils.    [provider]  folic acid (FOLVITE) 1 MG tablet Take 1 mg by mouth daily. 06/21/16   [provider]  gabapentin (NEURONTIN) 800 MG tablet Take 300 mg by mouth 3 (three) times daily.     [provider]  gentamicin ointment (GARAMYCIN) 0.1 % Apply 1 application as needed topically.    [provider]  hyoscyamine (LEVSIN, ANASPAZ) 0.125 MG tablet Take 0.125 mg by mouth every 4 (four) hours as needed.    [provider]  lisinopril (PRINIVIL,ZESTRIL) 10 MG tablet Take 10 mg daily by mouth.    [provider]  lisinopril-hydrochlorothiazide (PRINZIDE,ZESTORETIC) 20-12.5 MG tablet Take 1 tablet by mouth 2 (two)  times daily.    [provider]  loratadine (CLARITIN) 10 MG tablet Take 10 mg by mouth daily.    [provider]  Melatonin 5 MG TABS Take 10 mg by mouth at bedtime as needed.    [provider]  montelukast (SINGULAIR) 10 MG tablet Take 10 mg at bedtime by mouth.    [provider]  pantoprazole (PROTONIX) 40 MG tablet Take 40 mg by mouth 2 (two) times daily. 04/14/16   [provider]  Probiotic Product (ALIGN) 4 MG CAPS Take 4 mg daily by mouth.    [provider]  promethazine (PHENERGAN) 25 MG tablet Take 25 mg by mouth every 6 (six) hours as needed for nausea or vomiting.    [provider]  tobramycin (TOBREX) 0.3 % ophthalmic solution every 4 (four) hours. Use in sinus rinse    [provider]  valACYclovir (VALTREX) 500 MG tablet Take 500 mg 2 (two) times daily by mouth.    [provider]  Vitamin D, Ergocalciferol, (DRISDOL) 50000 units CAPS capsule Take 50,000 Units by mouth every 7 (seven) days.    [provider]  zolpidem (AMBIEN) 10 MG tablet Take 10 mg by mouth at bedtime as needed for sleep.    [provider]    Allergies Amlodipine; Aspirin; and Flagyl [metronidazole]  No family history on file.  Social History Social History   Tobacco Use  . Smoking status: Current Every Day Smoker    Packs/day: 0.50    Years: 47.00    Pack years: 23.50    Types: Cigarettes  . Smokeless tobacco: Never Used  . Tobacco comment: since age 54  Substance Use Topics  . Alcohol use: No  . Drug use: Never    Review of Systems  Constitutional: Negative for fever. Eyes: Negative for visual changes. ENT: Negative for sore throat. Neck: No neck pain  Cardiovascular: Negative for chest pain. Respiratory: Negative for shortness of breath. Gastrointestinal: Negative for abdominal pain, vomiting or diarrhea. Genitourinary: Negative for dysuria. Musculoskeletal: Negative for back  pain. Skin: Negative for rash. Neurological: Negative for headaches, weakness or numbness. Psych: No SI or HI  ____________________________________________   PHYSICAL EXAM:  VITAL SIGNS:  Vitals:   10/14/17 1930 10/14/17 2006  BP: 127/86 135/89  Pulse: 72 69  Resp: 15 (!) 23  SpO2: 94% 96%     Constitutional: Alert and oriented. Well appearing and in no apparent distress. HEENT:      Head: Normocephalic and atraumatic.         Eyes: Conjunctivae are normal. Sclera is non-icteric.       Mouth/Throat: Mucous membranes are moist.       Neck: Supple with no signs of meningismus. Cardiovascular: Regular rate and rhythm. No murmurs, gallops, or rubs. 2+ symmetrical distal pulses are present in all extremities. No JVD. Respiratory: Normal respiratory effort. Lungs are clear to auscultation bilaterally. No wheezes,  crackles, or rhonchi.  Gastrointestinal: Soft, non tender, and non distended with positive bowel sounds. No rebound or guarding. Musculoskeletal: Trace pitting edema bilateral lower extremity  neurologic: Normal speech and language. Face is symmetric. Moving all extremities. No gross focal neurologic deficits are appreciated. Skin: Skin is warm, dry and intact. No rash noted. Psychiatric: Mood and affect are normal. Speech and behavior are normal.  ____________________________________________   LABS (all labs ordered are listed, but only abnormal results are displayed)  Labs Reviewed  CBC WITH DIFFERENTIAL/PLATELET - Abnormal; Notable for the following components:      Result Value   WBC 19.0 (*)    RDW 14.8 (*)    Neutro Abs 14.2 (*)    Monocytes Absolute 1.9 (*)    All other components within normal limits  BASIC METABOLIC PANEL - Abnormal; Notable for the following components:   Glucose, Bld 100 (*)    All other components within normal limits  BRAIN NATRIURETIC PEPTIDE - Abnormal; Notable for the following components:   B Natriuretic Peptide 174.0 (*)     All other components within normal limits  TROPONIN I   ____________________________________________  EKG  ED ECG REPORT I, Rudene Re, the attending physician, personally viewed and interpreted this ECG.  Normal sinus rhythm, rate of 64, normal intervals, normal axis, hyperacute T waves on inferior and lateral leads with T wave inversions in aVL and V2, no ST elevation.  These changes are new when compared to prior. ____________________________________________  RADIOLOGY  I have personally reviewed the images performed during this visit and I agree with the Radiologist's read.   Interpretation by Radiologist:  Ct Chest W Contrast  Result Date: 10/14/2017 CLINICAL DATA:  Crohn disease with abscess and bowel surgery. Abdominal discomfort and pelvic pain since March. Leukocytosis. EXAM: CT CHEST, ABDOMEN, AND PELVIS WITH CONTRAST TECHNIQUE: Multidetector CT imaging of the chest, abdomen and pelvis was performed following the standard protocol during bolus administration of intravenous contrast. CONTRAST:  165m ISOVUE-300 IOPAMIDOL (ISOVUE-300) INJECTION 61% COMPARISON:  CT abdomen pelvis 10/15/2009. FINDINGS: CT CHEST FINDINGS Cardiovascular: Although this study was not optimized for the detection of pulmonary emboli, there are filling defects in the lower lobe pulmonary arteries bilaterally, extending peripherally from the lobar level. Atherosclerotic calcification of the arterial vasculature. Heart size normal. No pericardial effusion. Mediastinum/Nodes: No pathologically enlarged mediastinal, hilar or axillary lymph nodes. Esophagus is grossly unremarkable. Lungs/Pleura: Moderate centrilobular and mild paraseptal emphysema. 4 mm smudgy nodule along the minor fissure is likely a subpleural lymph node. Rounded area of subpleural decreased attenuation in the right lower lobe (series 4, image 142) may represent an infarct. Calcified granulomas. No pleural fluid. Airway is unremarkable.  Musculoskeletal: Degenerative changes in the spine. No worrisome lytic or sclerotic lesions. CT ABDOMEN PELVIS FINDINGS Hepatobiliary: Liver is decreased in attenuation. Liver and gallbladder are otherwise unremarkable. No biliary ductal dilatation. Pancreas: Negative. Spleen: Negative. Adrenals/Urinary Tract: Adrenal glands are unremarkable. An intermediate density lesion off the upper pole right kidney measures 2.0 cm and 30 Hounsfield units, previously 8 mm. Additional subcentimeter low-attenuation lesions in the kidneys are too small to characterize. Ureters are decompressed. Bladder is grossly unremarkable. Stomach/Bowel: Gastric bypass. Stomach, small bowel and majority of the colon are otherwise unremarkable. There is sigmoid wall thickening with minimal adjacent stranding and haziness. No extraluminal air or fluid. Appendix is not readily visualized. Vascular/Lymphatic: Atherosclerotic calcification of the arterial vasculature without abdominal aortic aneurysm. No pathologically enlarged lymph nodes. Reproductive: Prostate is visualized. Other: Small left inguinal  hernia contains fat.  No free fluid. Musculoskeletal: Degenerative changes in the spine. No worrisome lytic or sclerotic lesions. IMPRESSION: 1. Bilateral lower lobe pulmonary emboli with a probable peripheral right lower lobe infarct. Critical Value/emergent results were called by telephone at the time of interpretation on 10/14/2017 at 2:44 pm to Dr. Gaylyn Cheers , who verbally acknowledged these results. 2. Sigmoid wall thickening with mild adjacent haziness and stranding, suggesting diverticulitis. Scarring related to Crohn disease is another consideration. 3.  Aortic atherosclerosis (ICD10-170.0). 4.  Emphysema (ICD10-J43.9). 5. Hepatic steatosis. Electronically Signed   By: Lorin Picket M.D.   On: 10/14/2017 14:45   Ct Abdomen Pelvis W Contrast  Result Date: 10/14/2017 CLINICAL DATA:  Crohn disease with abscess and bowel surgery.  Abdominal discomfort and pelvic pain since March. Leukocytosis. EXAM: CT CHEST, ABDOMEN, AND PELVIS WITH CONTRAST TECHNIQUE: Multidetector CT imaging of the chest, abdomen and pelvis was performed following the standard protocol during bolus administration of intravenous contrast. CONTRAST:  160m ISOVUE-300 IOPAMIDOL (ISOVUE-300) INJECTION 61% COMPARISON:  CT abdomen pelvis 10/15/2009. FINDINGS: CT CHEST FINDINGS Cardiovascular: Although this study was not optimized for the detection of pulmonary emboli, there are filling defects in the lower lobe pulmonary arteries bilaterally, extending peripherally from the lobar level. Atherosclerotic calcification of the arterial vasculature. Heart size normal. No pericardial effusion. Mediastinum/Nodes: No pathologically enlarged mediastinal, hilar or axillary lymph nodes. Esophagus is grossly unremarkable. Lungs/Pleura: Moderate centrilobular and mild paraseptal emphysema. 4 mm smudgy nodule along the minor fissure is likely a subpleural lymph node. Rounded area of subpleural decreased attenuation in the right lower lobe (series 4, image 142) may represent an infarct. Calcified granulomas. No pleural fluid. Airway is unremarkable. Musculoskeletal: Degenerative changes in the spine. No worrisome lytic or sclerotic lesions. CT ABDOMEN PELVIS FINDINGS Hepatobiliary: Liver is decreased in attenuation. Liver and gallbladder are otherwise unremarkable. No biliary ductal dilatation. Pancreas: Negative. Spleen: Negative. Adrenals/Urinary Tract: Adrenal glands are unremarkable. An intermediate density lesion off the upper pole right kidney measures 2.0 cm and 30 Hounsfield units, previously 8 mm. Additional subcentimeter low-attenuation lesions in the kidneys are too small to characterize. Ureters are decompressed. Bladder is grossly unremarkable. Stomach/Bowel: Gastric bypass. Stomach, small bowel and majority of the colon are otherwise unremarkable. There is sigmoid wall thickening  with minimal adjacent stranding and haziness. No extraluminal air or fluid. Appendix is not readily visualized. Vascular/Lymphatic: Atherosclerotic calcification of the arterial vasculature without abdominal aortic aneurysm. No pathologically enlarged lymph nodes. Reproductive: Prostate is visualized. Other: Small left inguinal hernia contains fat.  No free fluid. Musculoskeletal: Degenerative changes in the spine. No worrisome lytic or sclerotic lesions. IMPRESSION: 1. Bilateral lower lobe pulmonary emboli with a probable peripheral right lower lobe infarct. Critical Value/emergent results were called by telephone at the time of interpretation on 10/14/2017 at 2:44 pm to Dr. RGaylyn Cheers, who verbally acknowledged these results. 2. Sigmoid wall thickening with mild adjacent haziness and stranding, suggesting diverticulitis. Scarring related to Crohn disease is another consideration. 3.  Aortic atherosclerosis (ICD10-170.0). 4.  Emphysema (ICD10-J43.9). 5. Hepatic steatosis. Electronically Signed   By: MLorin PicketM.D.   On: 10/14/2017 14:45     ____________________________________________   PROCEDURES  Procedure(s) performed: None Procedures Critical Care performed:  None ____________________________________________   INITIAL IMPRESSION / ASSESSMENT AND PLAN / ED COURSE  62y.o. male with a history of Crohn's disease, hypertension, and GERD who presents for evaluation of new pulmonary embolism.  Patient was a dental finding of a pulmonary embolism on CT  ordered by GI doctor due to leukocytosis.  CT is also concerning for possible distal right lower lobe infarct and diverticulitis.  Patient is asymptomatic and hemodynamically stable, normal work of breathing and no hypoxia.  His EKG is concerning showing hyperacute T waves in inferior and lateral leads which are new when compared to prior.  Troponin is pending.  I will start patient on antibiotics for diverticulitis and Eliquis for PE.      _________________________ 8:30 PM on 10/14/2017 -----------------------------------------  Due to signs of lung infarction, elevated white count, abnormal EKG the fact the patient is immune suppressed due to Crohn's disease I believe patient warrants admission for observation.  Will discuss with the hospitalist service.   As part of my medical decision making, I reviewed the following data within the [facility] notes reviewed and incorporated, Labs reviewed , EKG interpreted , Old chart reviewed, Radiograph reviewed , Notes from prior ED visits and [place] Controlled Substance Database    Pertinent labs & imaging results that were available during my care of the patient were reviewed by me and considered in my medical decision making (see chart for details).    ____________________________________________   FINAL CLINICAL IMPRESSION(S) / ED DIAGNOSES  Final diagnoses:  Other acute pulmonary embolism without acute cor pulmonale (HCC)  Infarct of lung (HCC)  Diverticulitis  Abnormal EKG      NEW MEDICATIONS STARTED DURING THIS VISIT:  ED Discharge Orders    None       Note:  This document was prepared using Dragon voice recognition software and may include unintentional dictation errors.    Alfred Levins, [place], MD 10/14/17 2032

## 2017-10-14 NOTE — Progress Notes (Signed)
ANTICOAGULATION CONSULT NOTE - Initial Consult  Pharmacy Consult for Eliquis Indication: pulmonary embolus  Allergies  Allergen Reactions  . Amlodipine Swelling    feet  . Aspirin Other (See Comments)    Peptic ulcers  . Flagyl [Metronidazole]     Gives neuropathy if taken too long     Patient Measurements:   Heparin Dosing Weight:   Vital Signs: BP: 135/89 (06/12 2006) Pulse Rate: 69 (06/12 2006)  Labs: Recent Labs    10/14/17 1909  HGB 15.6  HCT 46.9  PLT 241  CREATININE 0.97  TROPONINI <0.03    CrCl cannot be calculated (Unknown ideal weight.).   Medical History: Past Medical History:  Diagnosis Date  . Cancer ([place])    skin  . Crohn's disease ([place])   . GERD (gastroesophageal reflux disease)   . Hypertension   . Shingles    resolved  . Wears contact lenses    Left eye    Medications:  Infusions:  . [START ON 10/15/2017] piperacillin-tazobactam (ZOSYN)  IV      Assessment: 61 yom cc new PE. Was at GI OP for Crohn's f/u, had CT for persistent leukocytosis which showed diverticulitis and PE, possible peripheral right lower lobe infarct. Pharmacy consulted to dose Eliquis for PE  Goal of Therapy:     Plan:  Eliquis 10 mg po BID x 7 days followed by Eliquis 5 mg po BID  Laural Benes, Pharm.D., BCPS Clinical Pharmacist 10/14/2017,9:17 PM

## 2017-10-14 NOTE — H&P (Signed)
[facility] [place] [facility] NAME: Brent Diaz    MR#:  [id number]  DATE OF BIRTH:  March 16, 1956  DATE OF ADMISSION:  10/14/2017  PRIMARY CARE PHYSICIAN: Lynnell Jude, MD   REQUESTING/REFERRING PHYSICIAN: Dr. Rudene Re   CHIEF COMPLAINT:   Chief Complaint  Patient presents with  . Other    "Pulmonary Embolism"    HISTORY OF PRESENT ILLNESS:  Brent Diaz  is a 62 y.o. male with a known history of Crohn's disease, GERD, hypertension, history of shingles, GERD who presents to the hospital after having a outpatient CT scan of the chest abdomen pelvis which showed mild acute diverticulitis with bilateral pulmonary emboli with a pulmonary infarct.  Patient was in his usual state of health and was following up with his gastroenterologist who did some routine blood work and noticed that he had a leukocytosis.  They repeated his blood work and his leukocytosis had gotten worse, therefore the gastroenterologist ordered a CT scan of his chest abdomen pelvis.  Patient was noted to have bilateral pulmonary emboli with a mild pulmonary infarct along with acute diverticulitis.  Patient denies any chest pain, shortness of breath, hemoptysis, pleurisy, abdominal pain, nausea vomiting or any other associated symptoms.  He denies any fevers chills.  PAST MEDICAL HISTORY:   Past Medical History:  Diagnosis Date  . Cancer ([place])    skin  . Crohn's disease ([place])   . GERD (gastroesophageal reflux disease)   . Hypertension   . Shingles    resolved  . Wears contact lenses    Left eye    PAST SURGICAL HISTORY:   Past Surgical History:  Procedure Laterality Date  . ABDOMINAL SURGERY    . BOWEL RESECTION    . HYDROCELE EXCISION / REPAIR    . IMAGE GUIDED SINUS SURGERY N/A 05/21/2017   Procedure: IMAGE GUIDED SINUS SURGERY;  Surgeon: Margaretha Sheffield, MD;  Location: [place];  Service: ENT;  Laterality: N/A;  need stryker disk  . MAXILLARY ANTROSTOMY  Right 05/21/2017   Procedure: MAXILLARY ANTROSTOMY;  Surgeon: Margaretha Sheffield, MD;  Location: [place];  Service: ENT;  Laterality: Right;  . SEPTOPLASTY N/A 05/21/2017   Procedure: SEPTOPLASTY;  Surgeon: Margaretha Sheffield, MD;  Location: [place];  Service: ENT;  Laterality: N/A;    SOCIAL HISTORY:   Social History   Tobacco Use  . Smoking status: Current Every Day Smoker    Packs/day: 0.50    Years: 47.00    Pack years: 23.50    Types: Cigarettes  . Smokeless tobacco: Never Used  . Tobacco comment: since age 18  Substance Use Topics  . Alcohol use: No    FAMILY HISTORY:   Family History  Problem Relation Age of Onset  . Aneurysm Father 66  . Dementia Mother 73    DRUG ALLERGIES:   Allergies  Allergen Reactions  . Amlodipine Swelling    feet  . Aspirin Other (See Comments)    Peptic ulcers  . Flagyl [Metronidazole]     Gives neuropathy if taken too long     REVIEW OF SYSTEMS:   Review of Systems  Constitutional: Negative for fever and weight loss.  HENT: Negative for congestion, nosebleeds and tinnitus.   Eyes: Negative for blurred vision, double vision and redness.  Respiratory: Negative for cough, hemoptysis and shortness of breath.   Cardiovascular: Negative for chest pain, orthopnea, leg swelling and PND.  Gastrointestinal: Negative for abdominal pain, diarrhea,  melena, nausea and vomiting.  Genitourinary: Negative for dysuria, hematuria and urgency.  Musculoskeletal: Negative for falls and joint pain.  Neurological: Negative for dizziness, tingling, sensory change, focal weakness, seizures, weakness and headaches.  Endo/Heme/Allergies: Negative for polydipsia. Does not bruise/bleed easily.  Psychiatric/Behavioral: Negative for depression and memory loss. The patient is not nervous/anxious.     MEDICATIONS AT HOME:   Prior to Admission medications   Medication Sig Start Date End Date Taking? Authorizing Provider  albuterol (PROVENTIL  HFA;VENTOLIN HFA) 108 (90 Base) MCG/ACT inhaler Inhale 2 puffs into the lungs every 6 (six) hours as needed for wheezing or shortness of breath.    [provider]  B Complex-Folic Acid (B COMPLEX-VITAMIN B12 PO) Take 1,000 mcg by mouth daily.     [provider]  budesonide (ENTOCORT EC) 3 MG 24 hr capsule Take 9 mg by mouth daily.     [provider]  Cephalexin 500 MG tablet Take 500 mg by mouth 2 (two) times daily.    [provider]  cyclobenzaprine (FLEXERIL) 10 MG tablet Take 1 tablet (10 mg total) by mouth at bedtime. 09/26/17   Norval Gable, MD  doxycycline (VIBRA-TABS) 100 MG tablet Take 1 tablet (100 mg total) by mouth 2 (two) times daily. 09/26/17   Norval Gable, MD  fluticasone (FLONASE) 50 MCG/ACT nasal spray Place 2 sprays daily into both nostrils.    [provider]  folic acid (FOLVITE) 1 MG tablet Take 1 mg by mouth daily. 06/21/16   [provider]  gabapentin (NEURONTIN) 800 MG tablet Take 300 mg by mouth 3 (three) times daily.     [provider]  gentamicin ointment (GARAMYCIN) 0.1 % Apply 1 application as needed topically.    [provider]  hyoscyamine (LEVSIN, ANASPAZ) 0.125 MG tablet Take 0.125 mg by mouth every 4 (four) hours as needed.    [provider]  lisinopril (PRINIVIL,ZESTRIL) 10 MG tablet Take 10 mg daily by mouth.    [provider]  lisinopril-hydrochlorothiazide (PRINZIDE,ZESTORETIC) 20-12.5 MG tablet Take 1 tablet by mouth 2 (two) times daily.    [provider]  loratadine (CLARITIN) 10 MG tablet Take 10 mg by mouth daily.    [provider]  Melatonin 5 MG TABS Take 10 mg by mouth at bedtime as needed.    [provider]  montelukast (SINGULAIR) 10 MG tablet Take 10 mg at bedtime by mouth.    [provider]  pantoprazole (PROTONIX) 40 MG tablet Take 40 mg by mouth 2 (two) times daily. 04/14/16   [provider]  Probiotic  Product (ALIGN) 4 MG CAPS Take 4 mg daily by mouth.    [provider]  promethazine (PHENERGAN) 25 MG tablet Take 25 mg by mouth every 6 (six) hours as needed for nausea or vomiting.    [provider]  tobramycin (TOBREX) 0.3 % ophthalmic solution every 4 (four) hours. Use in sinus rinse    [provider]  valACYclovir (VALTREX) 500 MG tablet Take 500 mg 2 (two) times daily by mouth.    [provider]  Vitamin D, Ergocalciferol, (DRISDOL) 50000 units CAPS capsule Take 50,000 Units by mouth every 7 (seven) days.    [provider]  zolpidem (AMBIEN) 10 MG tablet Take 10 mg by mouth at bedtime as needed for sleep.    [provider]      VITAL SIGNS:  Blood pressure 135/89, pulse 69, resp. rate (!) 23, SpO2 96 %.  PHYSICAL EXAMINATION:  Physical Exam  GENERAL:  62 y.o.-year-old patient lying in the bed in no acute distress.  EYES: Pupils equal, round, reactive to light and accommodation. No scleral icterus. Extraocular muscles intact.  HEENT: Head atraumatic, normocephalic. Oropharynx and nasopharynx clear. No oropharyngeal erythema, moist oral mucosa  NECK:  Supple, no jugular venous distention. No thyroid enlargement, no tenderness.  LUNGS: Normal breath sounds bilaterally, no wheezing, rales, rhonchi. No use of accessory muscles of respiration.  CARDIOVASCULAR: S1, S2 RRR. No murmurs, rubs, gallops, clicks.  ABDOMEN: Soft, nontender, nondistended. Bowel sounds present. No organomegaly or mass.  EXTREMITIES: No pedal edema, cyanosis, or clubbing. + 2 pedal & radial pulses b/l.   NEUROLOGIC: Cranial nerves II through XII are intact. No focal Motor or sensory deficits appreciated b/l PSYCHIATRIC: The patient is alert and oriented x 3.  SKIN: No obvious rash, lesion, or ulcer.   LABORATORY PANEL:   CBC Recent Labs  Lab 10/14/17 1909  WBC 19.0*  HGB 15.6  HCT 46.9  PLT 241    ------------------------------------------------------------------------------------------------------------------  Chemistries  Recent Labs  Lab 10/14/17 1909  NA 142  K 4.1  CL 107  CO2 25  GLUCOSE 100*  BUN 8  CREATININE 0.97  CALCIUM 9.0   ------------------------------------------------------------------------------------------------------------------  Cardiac Enzymes Recent Labs  Lab 10/14/17 1909  TROPONINI <0.03   ------------------------------------------------------------------------------------------------------------------  RADIOLOGY:  Ct Chest W Contrast  Result Date: 10/14/2017 CLINICAL DATA:  Crohn disease with abscess and bowel surgery. Abdominal discomfort and pelvic pain since March. Leukocytosis. EXAM: CT CHEST, ABDOMEN, AND PELVIS WITH CONTRAST TECHNIQUE: Multidetector CT imaging of the chest, abdomen and pelvis was performed following the standard protocol during bolus administration of intravenous contrast. CONTRAST:  154m ISOVUE-300 IOPAMIDOL (ISOVUE-300) INJECTION 61% COMPARISON:  CT abdomen pelvis 10/15/2009. FINDINGS: CT CHEST FINDINGS Cardiovascular: Although this study was not optimized for the detection of pulmonary emboli, there are filling defects in the lower lobe pulmonary arteries bilaterally, extending peripherally from the lobar level. Atherosclerotic calcification of the arterial vasculature. Heart size normal. No pericardial effusion. Mediastinum/Nodes: No pathologically enlarged mediastinal, hilar or axillary lymph nodes. Esophagus is grossly unremarkable. Lungs/Pleura: Moderate centrilobular and mild paraseptal emphysema. 4 mm smudgy nodule along the minor fissure is likely a subpleural lymph node. Rounded area of subpleural decreased attenuation in the right lower lobe (series 4, image 142) may represent an infarct. Calcified granulomas. No pleural fluid. Airway is unremarkable. Musculoskeletal: Degenerative changes in the spine. No worrisome  lytic or sclerotic lesions. CT ABDOMEN PELVIS FINDINGS Hepatobiliary: Liver is decreased in attenuation. Liver and gallbladder are otherwise unremarkable. No biliary ductal dilatation. Pancreas: Negative. Spleen: Negative. Adrenals/Urinary Tract: Adrenal glands are unremarkable. An intermediate density lesion off the upper pole right kidney measures 2.0 cm and 30 Hounsfield units, previously 8 mm. Additional subcentimeter low-attenuation lesions in the kidneys are too small to characterize. Ureters are decompressed. Bladder is grossly unremarkable. Stomach/Bowel: Gastric bypass. Stomach, small bowel and majority of the colon are otherwise unremarkable. There is sigmoid wall thickening with minimal adjacent stranding and haziness. No extraluminal air or fluid. Appendix is not readily visualized. Vascular/Lymphatic: Atherosclerotic calcification of the arterial vasculature without abdominal aortic aneurysm. No pathologically enlarged lymph nodes. Reproductive: Prostate is visualized. Other: Small left inguinal hernia contains fat.  No free fluid. Musculoskeletal: Degenerative changes in the spine. No worrisome lytic or sclerotic lesions. IMPRESSION: 1. Bilateral lower lobe pulmonary emboli with a probable peripheral right lower lobe infarct. Critical Value/emergent results were called by telephone at the time of interpretation  on 10/14/2017 at 2:44 pm to Dr. Gaylyn Cheers , who verbally acknowledged these results. 2. Sigmoid wall thickening with mild adjacent haziness and stranding, suggesting diverticulitis. Scarring related to Crohn disease is another consideration. 3.  Aortic atherosclerosis (ICD10-170.0). 4.  Emphysema (ICD10-J43.9). 5. Hepatic steatosis. Electronically Signed   By: Lorin Picket M.D.   On: 10/14/2017 14:45   Ct Abdomen Pelvis W Contrast  Result Date: 10/14/2017 CLINICAL DATA:  Crohn disease with abscess and bowel surgery. Abdominal discomfort and pelvic pain since March. Leukocytosis. EXAM:  CT CHEST, ABDOMEN, AND PELVIS WITH CONTRAST TECHNIQUE: Multidetector CT imaging of the chest, abdomen and pelvis was performed following the standard protocol during bolus administration of intravenous contrast. CONTRAST:  148m ISOVUE-300 IOPAMIDOL (ISOVUE-300) INJECTION 61% COMPARISON:  CT abdomen pelvis 10/15/2009. FINDINGS: CT CHEST FINDINGS Cardiovascular: Although this study was not optimized for the detection of pulmonary emboli, there are filling defects in the lower lobe pulmonary arteries bilaterally, extending peripherally from the lobar level. Atherosclerotic calcification of the arterial vasculature. Heart size normal. No pericardial effusion. Mediastinum/Nodes: No pathologically enlarged mediastinal, hilar or axillary lymph nodes. Esophagus is grossly unremarkable. Lungs/Pleura: Moderate centrilobular and mild paraseptal emphysema. 4 mm smudgy nodule along the minor fissure is likely a subpleural lymph node. Rounded area of subpleural decreased attenuation in the right lower lobe (series 4, image 142) may represent an infarct. Calcified granulomas. No pleural fluid. Airway is unremarkable. Musculoskeletal: Degenerative changes in the spine. No worrisome lytic or sclerotic lesions. CT ABDOMEN PELVIS FINDINGS Hepatobiliary: Liver is decreased in attenuation. Liver and gallbladder are otherwise unremarkable. No biliary ductal dilatation. Pancreas: Negative. Spleen: Negative. Adrenals/Urinary Tract: Adrenal glands are unremarkable. An intermediate density lesion off the upper pole right kidney measures 2.0 cm and 30 Hounsfield units, previously 8 mm. Additional subcentimeter low-attenuation lesions in the kidneys are too small to characterize. Ureters are decompressed. Bladder is grossly unremarkable. Stomach/Bowel: Gastric bypass. Stomach, small bowel and majority of the colon are otherwise unremarkable. There is sigmoid wall thickening with minimal adjacent stranding and haziness. No extraluminal air or  fluid. Appendix is not readily visualized. Vascular/Lymphatic: Atherosclerotic calcification of the arterial vasculature without abdominal aortic aneurysm. No pathologically enlarged lymph nodes. Reproductive: Prostate is visualized. Other: Small left inguinal hernia contains fat.  No free fluid. Musculoskeletal: Degenerative changes in the spine. No worrisome lytic or sclerotic lesions. IMPRESSION: 1. Bilateral lower lobe pulmonary emboli with a probable peripheral right lower lobe infarct. Critical Value/emergent results were called by telephone at the time of interpretation on 10/14/2017 at 2:44 pm to Dr. RGaylyn Cheers, who verbally acknowledged these results. 2. Sigmoid wall thickening with mild adjacent haziness and stranding, suggesting diverticulitis. Scarring related to Crohn disease is another consideration. 3.  Aortic atherosclerosis (ICD10-170.0). 4.  Emphysema (ICD10-J43.9). 5. Hepatic steatosis. Electronically Signed   By: MLorin PicketM.D.   On: 10/14/2017 14:45     IMPRESSION AND PLAN:   62year old male with past medical history of Crohn's disease, GERD, hypertension, history of shingles who presents to the hospital due to abnormal CT scan of the chest abdomen pelvis suggestive of acute PE and acute diverticulitis.  1.  Acute pulmonary embolism-patient is clinically asymptomatic.  Patient denies any history of cancer or any poor mobility for any other risk factors for having pulmonary embolism. - Patient will be started on Eliquis.  Will check Dopplers of lower extremities to rule out underlying DVT.  2.  Acute diverticulitis-this was also incidentally noted on the CT abdomen pelvis.  Patient  is clinically asymptomatic with no abdominal pain fever, chills, nausea or vomiting.  Patient is being admitted to observation as he is on immunosuppressants and he has a leukocytosis. - We will empirically treat the patient with IV Zosyn, follow clinically, white cell count in the morning.  3.   Neuropathy-continue gabapentin.  4.  History of Crohn's disease- continue patient's Entocort. -Continue Levsin  5.  Essential hypertension-continue lisinopril/HCTZ.  6. GERD - cont. Protonix.     All the records are reviewed and case discussed with ED provider. Management plans discussed with the patient, family and they are in agreement.  CODE STATUS: Full code  TOTAL TIME TAKING CARE OF THIS PATIENT: 40 minutes.    Henreitta Leber M.D on 10/14/2017 at 9:10 PM  Between 7am to 6pm - Pager - [phone]  After 6pm go to [email or web address] - password [email or web address] [facility]  [facility]  Office  [phone]  CC: Primary care physician; Lynnell Jude, MD

## 2017-10-14 NOTE — Progress Notes (Signed)
Pharmacy Antibiotic Note  Brent Diaz is a 62 y.o. male admitted on 10/14/2017 with IAI.  Pharmacy has been consulted for Zosyn dosing.  Plan: Zosyn 3.375 gm IV Q8H EI     No data recorded.  Recent Labs  Lab 10/14/17 1909  WBC 19.0*  CREATININE 0.97    CrCl cannot be calculated (Unknown ideal weight.).    Allergies  Allergen Reactions  . Amlodipine Swelling    feet  . Aspirin Other (See Comments)    Peptic ulcers  . Flagyl [Metronidazole]     Gives neuropathy if taken too long     Antimicrobials this admission:   Dose adjustments this admission:   Microbiology results:  BCx:   UCx:    Sputum:    MRSA PCR:   Thank you for allowing pharmacy to be a part of this patient's care.  Laural Benes, Pharm.D., BCPS Clinical Pharmacist 10/14/2017 9:15 PM

## 2017-10-15 LAB — CBC
HEMATOCRIT: 43.9 % (ref 40.0–52.0)
HEMOGLOBIN: 14.7 g/dL (ref 13.0–18.0)
MCH: 32.2 pg (ref 26.0–34.0)
MCHC: 33.6 g/dL (ref 32.0–36.0)
MCV: 95.8 fL (ref 80.0–100.0)
Platelets: 201 10*3/uL (ref 150–440)
RBC: 4.58 MIL/uL (ref 4.40–5.90)
RDW: 14.5 % (ref 11.5–14.5)
WBC: 17 10*3/uL — AB (ref 3.8–10.6)

## 2017-10-15 LAB — BASIC METABOLIC PANEL
Anion gap: 5 (ref 5–15)
BUN: 10 mg/dL (ref 6–20)
CHLORIDE: 108 mmol/L (ref 101–111)
CO2: 27 mmol/L (ref 22–32)
Calcium: 8.4 mg/dL — ABNORMAL LOW (ref 8.9–10.3)
Creatinine, Ser: 0.73 mg/dL (ref 0.61–1.24)
GLUCOSE: 89 mg/dL (ref 65–99)
Potassium: 3.8 mmol/L (ref 3.5–5.1)
Sodium: 140 mmol/L (ref 135–145)

## 2017-10-15 MED ORDER — APIXABAN 5 MG PO TABS: 5.0000 mg | ORAL_TABLET | Freq: Two times a day (BID) | ORAL | 0 refills | Status: AC

## 2017-10-15 MED ORDER — AMOXICILLIN-POT CLAVULANATE 875-125 MG PO TABS: 1.0000 | ORAL_TABLET | Freq: Two times a day (BID) | ORAL | 0 refills | Status: DC

## 2017-10-15 MED ORDER — APIXABAN 5 MG PO TABS: 10.0000 mg | ORAL_TABLET | Freq: Two times a day (BID) | ORAL | 0 refills | Status: DC

## 2017-10-15 NOTE — Discharge Summary (Addendum)
[facility] [place] NAME: Brent Diaz    MR#:  [id number]  DATE OF BIRTH:  11-22-1955  DATE OF ADMISSION:  10/14/2017 ADMITTING PHYSICIAN: Henreitta Leber, MD  DATE OF DISCHARGE: 10/15/2017  PRIMARY CARE PHYSICIAN: Lynnell Jude, MD    ADMISSION DIAGNOSIS:  Diverticulitis [K57.92] Pulmonary embolism ([place]) [I26.99] Infarct of lung ([place]) [I26.99] Abnormal EKG [R94.31] Other acute pulmonary embolism without acute cor pulmonale (HCC) [I26.99]  DISCHARGE DIAGNOSIS:  Active Problems:   Pulmonary embolism ([place])   SECONDARY DIAGNOSIS:   Past Medical History:  Diagnosis Date  . Cancer ([place])    skin  . Crohn's disease ([place])   . GERD (gastroesophageal reflux disease)   . Hypertension   . Shingles    resolved  . Wears contact lenses    Left eye    HOSPITAL COURSE:   62 year old male with history of Crohn's disease who presented to the emergency room after CT scan ordered as outpatient showed bilateral PE and diverticulitis.  1.  Bilateral pulmonary emboli: Patient is asymptomatic Patient has been started on Eliquis.  Side effects, alternatives, risks and benefits were discussed with patient.  He accepts these risks. We did not obtain lower extremity Doppler as a treatment would be the same.  2.  Asymptomatic sigmoid diverticulitis: Patient was started on Zosyn and will be discharged on oral Augmentin. White blood cell count has improved 3.  History of Crohn's disease: Patient will follow-up with GI. He will continue Entocort. 4.  Neuropathy: Continue gabapentin  5.  Essential hypertension: Patient will continue lisinopril/HCTZ  6. Tobacco dependence: Patient is encouraged to quit smoking. Counseling was provided for 4 minutes.  DISCHARGE CONDITIONS AND DIET:   Stable for discharge on regular diet  CONSULTS OBTAINED:    DRUG ALLERGIES:   Allergies  Allergen Reactions  . Amlodipine Swelling    feet  . Aspirin Other  (See Comments)    Peptic ulcers  . Flagyl [Metronidazole]     Gives neuropathy if taken too long     DISCHARGE MEDICATIONS:   Allergies as of 10/15/2017      Reactions   Amlodipine Swelling   feet   Aspirin Other (See Comments)   Peptic ulcers   Flagyl [metronidazole]    Gives neuropathy if taken too long      Medication List    STOP taking these medications   albuterol 108 (90 Base) MCG/ACT inhaler Commonly known as:  PROVENTIL HFA;VENTOLIN HFA   Cephalexin 500 MG tablet   cyclobenzaprine 10 MG tablet Commonly known as:  FLEXERIL   doxycycline 100 MG tablet Commonly known as:  VIBRA-TABS   hyoscyamine 0.125 MG tablet Commonly known as:  LEVSIN, ANASPAZ     TAKE these medications   ALIGN 4 MG Caps Take 4 mg daily by mouth.   amoxicillin-clavulanate 875-125 MG tablet Commonly known as:  AUGMENTIN Take 1 tablet by mouth 2 (two) times daily.   apixaban 5 MG Tabs tablet Commonly known as:  ELIQUIS Take 2 tablets (10 mg total) by mouth 2 (two) times daily for 5 days.   apixaban 5 MG Tabs tablet Commonly known as:  ELIQUIS Take 1 tablet (5 mg total) by mouth 2 (two) times daily. Start 6/19 Start taking on:  10/21/2017   B COMPLEX-VITAMIN B12 PO Take 1,000 mcg by mouth daily.   budesonide 3 MG 24 hr capsule Commonly known as:  ENTOCORT EC Take 9 mg by mouth daily.  fluticasone 50 MCG/ACT nasal spray Commonly known as:  FLONASE Place 2 sprays daily into both nostrils.   folic acid 1 MG tablet Commonly known as:  FOLVITE Take 1 mg by mouth daily.   gabapentin 800 MG tablet Commonly known as:  NEURONTIN Take 300 mg by mouth 3 (three) times daily.   gentamicin ointment 0.1 % Commonly known as:  GARAMYCIN Apply 1 application as needed topically.   lisinopril 10 MG tablet Commonly known as:  PRINIVIL,ZESTRIL Take 10 mg daily by mouth.   lisinopril-hydrochlorothiazide 20-12.5 MG tablet Commonly known as:  PRINZIDE,ZESTORETIC Take 1 tablet by mouth 2  (two) times daily.   loratadine 10 MG tablet Commonly known as:  CLARITIN Take 10 mg by mouth daily.   Melatonin 5 MG Tabs Take 10 mg by mouth at bedtime as needed.   montelukast 10 MG tablet Commonly known as:  SINGULAIR Take 10 mg by mouth every morning.   pantoprazole 40 MG tablet Commonly known as:  PROTONIX Take 40 mg by mouth 2 (two) times daily.   potassium chloride SA 20 MEQ tablet Commonly known as:  K-DUR,KLOR-CON Take 1 tablet by mouth 2 (two) times daily.   promethazine 25 MG tablet Commonly known as:  PHENERGAN Take 25 mg by mouth every 6 (six) hours as needed for nausea or vomiting.   tobramycin 0.3 % ophthalmic solution Commonly known as:  TOBREX every 4 (four) hours. Use in sinus rinse   valACYclovir 500 MG tablet Commonly known as:  VALTREX Take 500 mg 2 (two) times daily by mouth.   Vitamin D (Ergocalciferol) 50000 units Caps capsule Commonly known as:  DRISDOL Take 50,000 Units by mouth every 7 (seven) days.   zolpidem 10 MG tablet Commonly known as:  AMBIEN Take 10 mg by mouth at bedtime as needed for sleep.         Today   CHIEF COMPLAINT:  Patient without abdominal pain, shortness of breath, chest pain, dizziness or lightheadedness.   VITAL SIGNS:  Blood pressure (!) 150/97, pulse 70, temperature 98.5 F (36.9 C), resp. rate 18, weight 81 kg (178 lb 9.2 oz), SpO2 96 %.   REVIEW OF SYSTEMS:  Review of Systems  Constitutional: Negative.  Negative for chills, fever and malaise/fatigue.  HENT: Negative.  Negative for ear discharge, ear pain, hearing loss, nosebleeds and sore throat.   Eyes: Negative.  Negative for blurred vision and pain.  Respiratory: Negative.  Negative for cough, hemoptysis, shortness of breath and wheezing.   Cardiovascular: Negative.  Negative for chest pain, palpitations and leg swelling.  Gastrointestinal: Negative.  Negative for abdominal pain, blood in stool, diarrhea, nausea and vomiting.  Genitourinary:  Negative.  Negative for dysuria.  Musculoskeletal: Negative.  Negative for back pain.  Skin: Negative.   Neurological: Negative for dizziness, tremors, speech change, focal weakness, seizures and headaches.  Endo/Heme/Allergies: Negative.  Does not bruise/bleed easily.  Psychiatric/Behavioral: Negative.  Negative for depression, hallucinations and suicidal ideas.     PHYSICAL EXAMINATION:  GENERAL:  62 y.o.-year-old patient lying in the bed with no acute distress.  NECK:  Supple, no jugular venous distention. No thyroid enlargement, no tenderness.  LUNGS: Normal breath sounds bilaterally, no wheezing, rales,rhonchi  No use of accessory muscles of respiration.  CARDIOVASCULAR: S1, S2 normal. No murmurs, rubs, or gallops.  ABDOMEN: Soft, non-tender, non-distended. Bowel sounds present. No organomegaly or mass.  EXTREMITIES: No pedal edema, cyanosis, or clubbing.  PSYCHIATRIC: The patient is alert and oriented x 3.  SKIN: No  obvious rash, lesion, or ulcer.   DATA REVIEW:   CBC Recent Labs  Lab 10/15/17 0354  WBC 17.0*  HGB 14.7  HCT 43.9  PLT 201    Chemistries  Recent Labs  Lab 10/15/17 0354  NA 140  K 3.8  CL 108  CO2 27  GLUCOSE 89  BUN 10  CREATININE 0.73  CALCIUM 8.4*    Cardiac Enzymes Recent Labs  Lab 10/14/17 1909  TROPONINI <0.03    Microbiology Results  @[email or web address] @  RADIOLOGY:  Ct Chest W Contrast  Result Date: 10/14/2017 CLINICAL DATA:  Crohn disease with abscess and bowel surgery. Abdominal discomfort and pelvic pain since March. Leukocytosis. EXAM: CT CHEST, ABDOMEN, AND PELVIS WITH CONTRAST TECHNIQUE: Multidetector CT imaging of the chest, abdomen and pelvis was performed following the standard protocol during bolus administration of intravenous contrast. CONTRAST:  12m ISOVUE-300 IOPAMIDOL (ISOVUE-300) INJECTION 61% COMPARISON:  CT abdomen pelvis 10/15/2009. FINDINGS: CT CHEST FINDINGS Cardiovascular: Although this study was not optimized for  the detection of pulmonary emboli, there are filling defects in the lower lobe pulmonary arteries bilaterally, extending peripherally from the lobar level. Atherosclerotic calcification of the arterial vasculature. Heart size normal. No pericardial effusion. Mediastinum/Nodes: No pathologically enlarged mediastinal, hilar or axillary lymph nodes. Esophagus is grossly unremarkable. Lungs/Pleura: Moderate centrilobular and mild paraseptal emphysema. 4 mm smudgy nodule along the minor fissure is likely a subpleural lymph node. Rounded area of subpleural decreased attenuation in the right lower lobe (series 4, image 142) may represent an infarct. Calcified granulomas. No pleural fluid. Airway is unremarkable. Musculoskeletal: Degenerative changes in the spine. No worrisome lytic or sclerotic lesions. CT ABDOMEN PELVIS FINDINGS Hepatobiliary: Liver is decreased in attenuation. Liver and gallbladder are otherwise unremarkable. No biliary ductal dilatation. Pancreas: Negative. Spleen: Negative. Adrenals/Urinary Tract: Adrenal glands are unremarkable. An intermediate density lesion off the upper pole right kidney measures 2.0 cm and 30 Hounsfield units, previously 8 mm. Additional subcentimeter low-attenuation lesions in the kidneys are too small to characterize. Ureters are decompressed. Bladder is grossly unremarkable. Stomach/Bowel: Gastric bypass. Stomach, small bowel and majority of the colon are otherwise unremarkable. There is sigmoid wall thickening with minimal adjacent stranding and haziness. No extraluminal air or fluid. Appendix is not readily visualized. Vascular/Lymphatic: Atherosclerotic calcification of the arterial vasculature without abdominal aortic aneurysm. No pathologically enlarged lymph nodes. Reproductive: Prostate is visualized. Other: Small left inguinal hernia contains fat.  No free fluid. Musculoskeletal: Degenerative changes in the spine. No worrisome lytic or sclerotic lesions. IMPRESSION: 1.  Bilateral lower lobe pulmonary emboli with a probable peripheral right lower lobe infarct. Critical Value/emergent results were called by telephone at the time of interpretation on 10/14/2017 at 2:44 pm to Dr. RGaylyn Cheers, who verbally acknowledged these results. 2. Sigmoid wall thickening with mild adjacent haziness and stranding, suggesting diverticulitis. Scarring related to Crohn disease is another consideration. 3.  Aortic atherosclerosis (ICD10-170.0). 4.  Emphysema (ICD10-J43.9). 5. Hepatic steatosis. Electronically Signed   By: MLorin PicketM.D.   On: 10/14/2017 14:45   Ct Abdomen Pelvis W Contrast  Result Date: 10/14/2017 CLINICAL DATA:  Crohn disease with abscess and bowel surgery. Abdominal discomfort and pelvic pain since March. Leukocytosis. EXAM: CT CHEST, ABDOMEN, AND PELVIS WITH CONTRAST TECHNIQUE: Multidetector CT imaging of the chest, abdomen and pelvis was performed following the standard protocol during bolus administration of intravenous contrast. CONTRAST:  1064mISOVUE-300 IOPAMIDOL (ISOVUE-300) INJECTION 61% COMPARISON:  CT abdomen pelvis 10/15/2009. FINDINGS: CT CHEST FINDINGS Cardiovascular: Although this study was not optimized  for the detection of pulmonary emboli, there are filling defects in the lower lobe pulmonary arteries bilaterally, extending peripherally from the lobar level. Atherosclerotic calcification of the arterial vasculature. Heart size normal. No pericardial effusion. Mediastinum/Nodes: No pathologically enlarged mediastinal, hilar or axillary lymph nodes. Esophagus is grossly unremarkable. Lungs/Pleura: Moderate centrilobular and mild paraseptal emphysema. 4 mm smudgy nodule along the minor fissure is likely a subpleural lymph node. Rounded area of subpleural decreased attenuation in the right lower lobe (series 4, image 142) may represent an infarct. Calcified granulomas. No pleural fluid. Airway is unremarkable. Musculoskeletal: Degenerative changes in the  spine. No worrisome lytic or sclerotic lesions. CT ABDOMEN PELVIS FINDINGS Hepatobiliary: Liver is decreased in attenuation. Liver and gallbladder are otherwise unremarkable. No biliary ductal dilatation. Pancreas: Negative. Spleen: Negative. Adrenals/Urinary Tract: Adrenal glands are unremarkable. An intermediate density lesion off the upper pole right kidney measures 2.0 cm and 30 Hounsfield units, previously 8 mm. Additional subcentimeter low-attenuation lesions in the kidneys are too small to characterize. Ureters are decompressed. Bladder is grossly unremarkable. Stomach/Bowel: Gastric bypass. Stomach, small bowel and majority of the colon are otherwise unremarkable. There is sigmoid wall thickening with minimal adjacent stranding and haziness. No extraluminal air or fluid. Appendix is not readily visualized. Vascular/Lymphatic: Atherosclerotic calcification of the arterial vasculature without abdominal aortic aneurysm. No pathologically enlarged lymph nodes. Reproductive: Prostate is visualized. Other: Small left inguinal hernia contains fat.  No free fluid. Musculoskeletal: Degenerative changes in the spine. No worrisome lytic or sclerotic lesions. IMPRESSION: 1. Bilateral lower lobe pulmonary emboli with a probable peripheral right lower lobe infarct. Critical Value/emergent results were called by telephone at the time of interpretation on 10/14/2017 at 2:44 pm to Dr. Gaylyn Cheers , who verbally acknowledged these results. 2. Sigmoid wall thickening with mild adjacent haziness and stranding, suggesting diverticulitis. Scarring related to Crohn disease is another consideration. 3.  Aortic atherosclerosis (ICD10-170.0). 4.  Emphysema (ICD10-J43.9). 5. Hepatic steatosis. Electronically Signed   By: Lorin Picket M.D.   On: 10/14/2017 14:45      Allergies as of 10/15/2017      Reactions   Amlodipine Swelling   feet   Aspirin Other (See Comments)   Peptic ulcers   Flagyl [metronidazole]    Gives  neuropathy if taken too long      Medication List    STOP taking these medications   albuterol 108 (90 Base) MCG/ACT inhaler Commonly known as:  PROVENTIL HFA;VENTOLIN HFA   Cephalexin 500 MG tablet   cyclobenzaprine 10 MG tablet Commonly known as:  FLEXERIL   doxycycline 100 MG tablet Commonly known as:  VIBRA-TABS   hyoscyamine 0.125 MG tablet Commonly known as:  LEVSIN, ANASPAZ     TAKE these medications   ALIGN 4 MG Caps Take 4 mg daily by mouth.   amoxicillin-clavulanate 875-125 MG tablet Commonly known as:  AUGMENTIN Take 1 tablet by mouth 2 (two) times daily.   apixaban 5 MG Tabs tablet Commonly known as:  ELIQUIS Take 2 tablets (10 mg total) by mouth 2 (two) times daily for 5 days.   apixaban 5 MG Tabs tablet Commonly known as:  ELIQUIS Take 1 tablet (5 mg total) by mouth 2 (two) times daily. Start 6/19 Start taking on:  10/21/2017   B COMPLEX-VITAMIN B12 PO Take 1,000 mcg by mouth daily.   budesonide 3 MG 24 hr capsule Commonly known as:  ENTOCORT EC Take 9 mg by mouth daily.   fluticasone 50 MCG/ACT nasal spray Commonly known as:  FLONASE Place 2 sprays daily into both nostrils.   folic acid 1 MG tablet Commonly known as:  FOLVITE Take 1 mg by mouth daily.   gabapentin 800 MG tablet Commonly known as:  NEURONTIN Take 300 mg by mouth 3 (three) times daily.   gentamicin ointment 0.1 % Commonly known as:  GARAMYCIN Apply 1 application as needed topically.   lisinopril 10 MG tablet Commonly known as:  PRINIVIL,ZESTRIL Take 10 mg daily by mouth.   lisinopril-hydrochlorothiazide 20-12.5 MG tablet Commonly known as:  PRINZIDE,ZESTORETIC Take 1 tablet by mouth 2 (two) times daily.   loratadine 10 MG tablet Commonly known as:  CLARITIN Take 10 mg by mouth daily.   Melatonin 5 MG Tabs Take 10 mg by mouth at bedtime as needed.   montelukast 10 MG tablet Commonly known as:  SINGULAIR Take 10 mg by mouth every morning.   pantoprazole 40 MG  tablet Commonly known as:  PROTONIX Take 40 mg by mouth 2 (two) times daily.   potassium chloride SA 20 MEQ tablet Commonly known as:  K-DUR,KLOR-CON Take 1 tablet by mouth 2 (two) times daily.   promethazine 25 MG tablet Commonly known as:  PHENERGAN Take 25 mg by mouth every 6 (six) hours as needed for nausea or vomiting.   tobramycin 0.3 % ophthalmic solution Commonly known as:  TOBREX every 4 (four) hours. Use in sinus rinse   valACYclovir 500 MG tablet Commonly known as:  VALTREX Take 500 mg 2 (two) times daily by mouth.   Vitamin D (Ergocalciferol) 50000 units Caps capsule Commonly known as:  DRISDOL Take 50,000 Units by mouth every 7 (seven) days.   zolpidem 10 MG tablet Commonly known as:  AMBIEN Take 10 mg by mouth at bedtime as needed for sleep.         Management plans discussed with the patient and  is in agreement. Stable for discharge home  Patient should follow up with pcp  CODE STATUS:     Code Status Orders  (From admission, onward)        Start     Ordered   10/14/17 2159  Full code  Continuous     10/14/17 2158    Code Status History    Date Active Date Inactive Code Status Order ID Comments User Context   06/30/2016 1812 07/02/2016 1702 Full Code [id number]  Fritzi Mandes, MD Inpatient      TOTAL TIME TAKING CARE OF THIS PATIENT: 38 minutes.    Note: This dictation was prepared with Dragon dictation along with smaller phrase technology. Any transcriptional errors that result from this process are unintentional.  Jeree Delcid M.D on 10/15/2017 at 7:50 AM  Between 7am to 6pm - Pager - [phone] After 6pm go to [email or web address] - password [email or web address] [facility]  Office  [phone]  CC: Primary care physician; Lynnell Jude, MD

## 2017-10-15 NOTE — Progress Notes (Signed)
Discharge instructions reviewed with patient and spouse with patient's permission. Patient verbalized understanding; discussed blood thinner precautions. Patient given Rx to take to pharmacy and reminded to call and schedule follow up appointments. Patient declined wheelchair to vehicle, steady gait, no difficulty ambulating noted. Patient's spouse drive patient home.

## 2017-10-15 NOTE — Progress Notes (Signed)
Family Meeting Note  Advance Directive:no  Today a meeting took place with the Patient.   The following clinical team members were present during this meeting:MD  The following were discussed:Patient's diagnosis bilateral asymptomatic PE Sigmoid diverticulitis Crohn's disease: , Patient's progosis: > 12 months and Goals for treatment: Full Code  Additional follow-up to be provided: Patient will speak with his primary care physician to start advanced directives  Time spent during discussion: 16 minutes  Brent Wickstrom, MD

## 2017-10-15 NOTE — Plan of Care (Signed)
  Problem: Education: Goal: Knowledge of General Education information will improve Outcome: Progressing   Problem: Health Behavior/Discharge Planning: Goal: Ability to manage health-related needs will improve Outcome: Progressing   Problem: Clinical Measurements: Goal: Ability to maintain clinical measurements within normal limits will improve Outcome: Progressing Goal: Will remain free from infection Outcome: Progressing Goal: Diagnostic test results will improve Outcome: Progressing Goal: Respiratory complications will improve Outcome: Progressing Goal: Cardiovascular complication will be avoided Outcome: Progressing   Problem: Activity: Goal: Risk for activity intolerance will decrease Outcome: Progressing   Problem: Nutrition: Goal: Adequate nutrition will be maintained Outcome: Progressing   Problem: Coping: Goal: Level of anxiety will decrease Outcome: Progressing   

## 2017-10-15 NOTE — Care Management Obs Status (Signed)
MEDICARE OBSERVATION STATUS NOTIFICATION   Patient Details  Name: Brent Diaz MRN: [id number] Date of Birth: 03/26/1956   Medicare Observation Status Notification Given:  Yes    Jolly Mango, RN 10/15/2017, 9:09 AM

## 2017-10-16 LAB — HIV ANTIBODY (ROUTINE TESTING W REFLEX): HIV SCREEN 4TH GENERATION: NONREACTIVE

## 2017-11-09 ENCOUNTER — Inpatient Hospital Stay: Payer: Medicare PPO | Attending: Oncology | Admitting: Oncology

## 2017-11-09 ENCOUNTER — Encounter: Payer: Self-pay | Admitting: Oncology

## 2017-11-09 ENCOUNTER — Inpatient Hospital Stay: Payer: Medicare PPO

## 2017-11-09 VITALS — BP 133/84 | HR 82 | Temp 97.2°F | Resp 18 | Ht 70.0 in | Wt 171.5 lb

## 2017-11-09 DIAGNOSIS — Z720 Tobacco use: Secondary | ICD-10-CM | POA: Diagnosis not present

## 2017-11-09 DIAGNOSIS — I2699 Other pulmonary embolism without acute cor pulmonale: Secondary | ICD-10-CM | POA: Diagnosis not present

## 2017-11-09 DIAGNOSIS — D72824 Basophilia: Secondary | ICD-10-CM | POA: Diagnosis not present

## 2017-11-09 DIAGNOSIS — I10 Essential (primary) hypertension: Secondary | ICD-10-CM | POA: Diagnosis not present

## 2017-11-09 DIAGNOSIS — E876 Hypokalemia: Secondary | ICD-10-CM | POA: Insufficient documentation

## 2017-11-09 DIAGNOSIS — D72821 Monocytosis (symptomatic): Secondary | ICD-10-CM | POA: Diagnosis not present

## 2017-11-09 DIAGNOSIS — D72828 Other elevated white blood cell count: Secondary | ICD-10-CM | POA: Insufficient documentation

## 2017-11-09 DIAGNOSIS — D729 Disorder of white blood cells, unspecified: Secondary | ICD-10-CM

## 2017-11-09 DIAGNOSIS — D72829 Elevated white blood cell count, unspecified: Secondary | ICD-10-CM | POA: Insufficient documentation

## 2017-11-09 LAB — CBC WITH DIFFERENTIAL/PLATELET
BASOS PCT: 0 %
Band Neutrophils: 1 %
Basophils Absolute: 0 10*3/uL (ref 0–0.1)
EOS ABS: 0 10*3/uL (ref 0–0.7)
EOS PCT: 0 %
HCT: 47.1 % (ref 40.0–52.0)
HEMOGLOBIN: 15.7 g/dL (ref 13.0–18.0)
LYMPHS PCT: 8 %
Lymphs Abs: 1.6 10*3/uL (ref 1.0–3.6)
MCH: 31.9 pg (ref 26.0–34.0)
MCHC: 33.3 g/dL (ref 32.0–36.0)
MCV: 95.7 fL (ref 80.0–100.0)
MONO ABS: 1.4 10*3/uL — AB (ref 0.2–1.0)
MONOS PCT: 7 %
Metamyelocytes Relative: 2 %
Myelocytes: 2 %
NEUTROS ABS: 17.1 10*3/uL — AB (ref 1.4–6.5)
NEUTROS PCT: 80 %
Platelets: 251 10*3/uL (ref 150–440)
RBC: 4.92 MIL/uL (ref 4.40–5.90)
RDW: 15 % — AB (ref 11.5–14.5)
WBC: 20.1 10*3/uL — ABNORMAL HIGH (ref 3.8–10.6)

## 2017-11-09 LAB — TECHNOLOGIST SMEAR REVIEW

## 2017-11-09 NOTE — Progress Notes (Signed)
Hematology/Oncology Consult note [facility] [phone] [phone]  Patient Care Team: Lynnell Jude, MD as PCP - General (Family Medicine)   Name of the patient: Brent Diaz  [id number]  [birth year]    Reason for referral- leucocytosis   Referring physician- Dr. Vira Agar  Date of visit: 11/09/17   History of presenting illness- Patient is a 62 yr old male with h/o Chrons disease and sees Dr. Vira Agar for the same. He was on humira in the past and most recently has been on entocort. He was taken off entocort in jan 2018 when he developed extensive shingles. He has had prior bowel surgeries. He recently had ct chest abdomen pelvis with contrast in June 2019 which showed bilateral lower lobe PE and sigmoid wall thickening. Possible diverticulitis. He has also been on long term antibiotics for sinus infections from [facility] - march 2019. He has been referred to [place] for leucocytosis.   Patient has had leucocytosis dating back atleast to 2015 when his wbc was 13.4. It went up to 15 in 2016 followed by 25.7 in may 2019. In July 2019 his wbc was 21.8. Differential mainly showed neutrophilia and basophilia. He has had some monocytosis in the past as well. Left shift was noted. Hb stable at 15. Platelet count has been normal.   Patient is recovering from his recent bout of sinus infections. He is off antibiotics. He feels mild fatigue. Appetite is good. No unintentional weight loss or night sweats. He does not have diarrhea or bloody bowel movements  ECOG PS- 1  Pain scale- 0   Review of systems- Review of Systems  Constitutional: Negative for chills, fever, malaise/fatigue and weight loss.  HENT: Negative for congestion, ear discharge and nosebleeds.   Eyes: Negative for blurred vision.  Respiratory: Negative for cough, hemoptysis, sputum production, shortness of breath and wheezing.   Cardiovascular: Negative for chest pain, palpitations, orthopnea and  claudication.  Gastrointestinal: Negative for abdominal pain, blood in stool, constipation, diarrhea, heartburn, melena, nausea and vomiting.  Genitourinary: Negative for dysuria, flank pain, frequency, hematuria and urgency.  Musculoskeletal: Negative for back pain, joint pain and myalgias.  Skin: Negative for rash.  Neurological: Negative for dizziness, tingling, focal weakness, seizures, weakness and headaches.  Endo/Heme/Allergies: Does not bruise/bleed easily.  Psychiatric/Behavioral: Negative for depression and suicidal ideas. The patient does not have insomnia.     Allergies  Allergen Reactions  . Amlodipine Swelling    feet  . Aspirin Other (See Comments)    Peptic ulcers  . Flagyl [Metronidazole]     Gives neuropathy if taken too long     Patient Active Problem List   Diagnosis Date Noted  . Pulmonary embolism ([place]) 10/14/2017  . Eye swelling, right 06/30/2016     Past Medical History:  Diagnosis Date  . Cancer ([place])    skin  . Crohn's disease ([place])   . GERD (gastroesophageal reflux disease)   . Hypertension   . Shingles    resolved  . Wears contact lenses    Left eye     Past Surgical History:  Procedure Laterality Date  . ABDOMINAL SURGERY    . BOWEL RESECTION    . HYDROCELE EXCISION / REPAIR    . IMAGE GUIDED SINUS SURGERY N/A 05/21/2017   Procedure: IMAGE GUIDED SINUS SURGERY;  Surgeon: Margaretha Sheffield, MD;  Location: [place];  Service: ENT;  Laterality: N/A;  need stryker disk  . MAXILLARY ANTROSTOMY Right 05/21/2017   Procedure: MAXILLARY  ANTROSTOMY;  Surgeon: Margaretha Sheffield, MD;  Location: [place];  Service: ENT;  Laterality: Right;  . SEPTOPLASTY N/A 05/21/2017   Procedure: SEPTOPLASTY;  Surgeon: Margaretha Sheffield, MD;  Location: [place];  Service: ENT;  Laterality: N/A;    Social History   Socioeconomic History  . Marital status: Married    Spouse name: Not on file  . Number of children: Not on file  . Years of  education: Not on file  . Highest education level: Not on file  Occupational History  . Not on file  Social Needs  . Financial resource strain: Not on file  . Food insecurity:    Worry: Not on file    Inability: Not on file  . Transportation needs:    Medical: Not on file    Non-medical: Not on file  Tobacco Use  . Smoking status: Current Every Day Smoker    Packs/day: 0.50    Years: 47.00    Pack years: 23.50    Types: Cigarettes  . Smokeless tobacco: Never Used  . Tobacco comment: since age 27  Substance and Sexual Activity  . Alcohol use: No  . Drug use: Never  . Sexual activity: Not on file  Lifestyle  . Physical activity:    Days per week: Not on file    Minutes per session: Not on file  . Stress: Not on file  Relationships  . Social connections:    Talks on phone: Not on file    Gets together: Not on file    Attends religious service: Not on file    Active member of club or organization: Not on file    Attends meetings of clubs or organizations: Not on file    Relationship status: Not on file  . Intimate partner violence:    Fear of current or ex partner: Not on file    Emotionally abused: Not on file    Physically abused: Not on file    Forced sexual activity: Not on file  Other Topics Concern  . Not on file  Social History Narrative  . Not on file     Family History  Problem Relation Age of Onset  . Aneurysm Father 7  . Dementia Mother 51     Current Outpatient Medications:  .  apixaban (ELIQUIS) 5 MG TABS tablet, Take 2 tablets (10 mg total) by mouth 2 (two) times daily for 5 days., Disp: 20 tablet, Rfl: 0 .  apixaban (ELIQUIS) 5 MG TABS tablet, Take 1 tablet (5 mg total) by mouth 2 (two) times daily. Start 6/19, Disp: 60 tablet, Rfl: 0 .  budesonide (ENTOCORT EC) 3 MG 24 hr capsule, Take 9 mg by mouth daily. , Disp: , Rfl:  .  folic acid (FOLVITE) 1 MG tablet, Take 1 mg by mouth daily., Disp: , Rfl: 6 .  lisinopril-hydrochlorothiazide  (PRINZIDE,ZESTORETIC) 20-12.5 MG tablet, Take 1 tablet by mouth 2 (two) times daily., Disp: , Rfl:  .  loratadine (CLARITIN) 10 MG tablet, Take 10 mg by mouth daily., Disp: , Rfl:  .  montelukast (SINGULAIR) 10 MG tablet, Take 10 mg by mouth every morning. , Disp: , Rfl:  .  pantoprazole (PROTONIX) 40 MG tablet, Take 40 mg by mouth 2 (two) times daily., Disp: , Rfl: 6 .  potassium chloride SA (K-DUR,KLOR-CON) 20 MEQ tablet, Take 1 tablet by mouth 2 (two) times daily., Disp: , Rfl:  .  valACYclovir (VALTREX) 500 MG tablet, Take 500 mg 2 (two)  times daily by mouth., Disp: , Rfl:  .  Vitamin D, Ergocalciferol, (DRISDOL) 50000 units CAPS capsule, Take 50,000 Units by mouth every 7 (seven) days., Disp: , Rfl:  .  zolpidem (AMBIEN) 10 MG tablet, Take 10 mg by mouth at bedtime as needed for sleep., Disp: , Rfl:  .  amoxicillin-clavulanate (AUGMENTIN) 875-125 MG tablet, Take 1 tablet by mouth 2 (two) times daily., Disp: 16 tablet, Rfl: 0 .  B Complex-Folic Acid (B COMPLEX-VITAMIN B12 PO), Take 1,000 mcg by mouth daily. , Disp: , Rfl:  .  fluticasone (FLONASE) 50 MCG/ACT nasal spray, Place 2 sprays daily into both nostrils., Disp: , Rfl:  .  gabapentin (NEURONTIN) 800 MG tablet, Take 300 mg by mouth 3 (three) times daily. , Disp: , Rfl:  .  gentamicin ointment (GARAMYCIN) 0.1 %, Apply 1 application as needed topically., Disp: , Rfl:  .  lisinopril (PRINIVIL,ZESTRIL) 10 MG tablet, Take 10 mg daily by mouth., Disp: , Rfl:  .  Melatonin 5 MG TABS, Take 10 mg by mouth at bedtime as needed., Disp: , Rfl:  .  Probiotic Product (ALIGN) 4 MG CAPS, Take 4 mg daily by mouth., Disp: , Rfl:  .  promethazine (PHENERGAN) 25 MG tablet, Take 25 mg by mouth every 6 (six) hours as needed for nausea or vomiting., Disp: , Rfl:  .  tobramycin (TOBREX) 0.3 % ophthalmic solution, every 4 (four) hours. Use in sinus rinse, Disp: , Rfl:    Physical exam: There were no vitals filed for this visit. Physical Exam  Constitutional:  He is oriented to person, place, and time.  HENT:  Head: Normocephalic and atraumatic.  Eyes: Pupils are equal, round, and reactive to light. EOM are normal.  Neck: Normal range of motion.  Cardiovascular: Normal rate, regular rhythm and normal heart sounds.  Pulmonary/Chest: Effort normal and breath sounds normal.  Abdominal: Soft. Bowel sounds are normal.  No palpable splenomegaly  Lymphadenopathy:  No palpable cervical, supraclavicular, axillary or inguinal adenopathy   Neurological: He is alert and oriented to person, place, and time.  Skin: Skin is warm and dry.  Skin over face and extremities is erythematous. Prior scars from scc seen over face and b/l hands       CMP Latest Ref Rng & Units 10/15/2017  Glucose 65 - 99 mg/dL 89  BUN 6 - 20 mg/dL 10  Creatinine 0.61 - 1.24 mg/dL 0.73  Sodium 135 - 145 mmol/L 140  Potassium 3.5 - 5.1 mmol/L 3.8  Chloride 101 - 111 mmol/L 108  CO2 22 - 32 mmol/L 27  Calcium 8.9 - 10.3 mg/dL 8.4(L)   CBC Latest Ref Rng & Units 10/15/2017  WBC 3.8 - 10.6 K/uL 17.0(H)  Hemoglobin 13.0 - 18.0 g/dL 14.7  Hematocrit 40.0 - 52.0 % 43.9  Platelets 150 - 440 K/uL 201    No images are attached to the encounter.  Ct Chest W Contrast  Result Date: 10/14/2017 CLINICAL DATA:  Crohn disease with abscess and bowel surgery. Abdominal discomfort and pelvic pain since March. Leukocytosis. EXAM: CT CHEST, ABDOMEN, AND PELVIS WITH CONTRAST TECHNIQUE: Multidetector CT imaging of the chest, abdomen and pelvis was performed following the standard protocol during bolus administration of intravenous contrast. CONTRAST:  137m ISOVUE-300 IOPAMIDOL (ISOVUE-300) INJECTION 61% COMPARISON:  CT abdomen pelvis 10/15/2009. FINDINGS: CT CHEST FINDINGS Cardiovascular: Although this study was not optimized for the detection of pulmonary emboli, there are filling defects in the lower lobe pulmonary arteries bilaterally, extending peripherally from the lobar  level. Atherosclerotic  calcification of the arterial vasculature. Heart size normal. No pericardial effusion. Mediastinum/Nodes: No pathologically enlarged mediastinal, hilar or axillary lymph nodes. Esophagus is grossly unremarkable. Lungs/Pleura: Moderate centrilobular and mild paraseptal emphysema. 4 mm smudgy nodule along the minor fissure is likely a subpleural lymph node. Rounded area of subpleural decreased attenuation in the right lower lobe (series 4, image 142) may represent an infarct. Calcified granulomas. No pleural fluid. Airway is unremarkable. Musculoskeletal: Degenerative changes in the spine. No worrisome lytic or sclerotic lesions. CT ABDOMEN PELVIS FINDINGS Hepatobiliary: Liver is decreased in attenuation. Liver and gallbladder are otherwise unremarkable. No biliary ductal dilatation. Pancreas: Negative. Spleen: Negative. Adrenals/Urinary Tract: Adrenal glands are unremarkable. An intermediate density lesion off the upper pole right kidney measures 2.0 cm and 30 Hounsfield units, previously 8 mm. Additional subcentimeter low-attenuation lesions in the kidneys are too small to characterize. Ureters are decompressed. Bladder is grossly unremarkable. Stomach/Bowel: Gastric bypass. Stomach, small bowel and majority of the colon are otherwise unremarkable. There is sigmoid wall thickening with minimal adjacent stranding and haziness. No extraluminal air or fluid. Appendix is not readily visualized. Vascular/Lymphatic: Atherosclerotic calcification of the arterial vasculature without abdominal aortic aneurysm. No pathologically enlarged lymph nodes. Reproductive: Prostate is visualized. Other: Small left inguinal hernia contains fat.  No free fluid. Musculoskeletal: Degenerative changes in the spine. No worrisome lytic or sclerotic lesions. IMPRESSION: 1. Bilateral lower lobe pulmonary emboli with a probable peripheral right lower lobe infarct. Critical Value/emergent results were called by telephone at the time of  interpretation on 10/14/2017 at 2:44 pm to Dr. Gaylyn Cheers , who verbally acknowledged these results. 2. Sigmoid wall thickening with mild adjacent haziness and stranding, suggesting diverticulitis. Scarring related to Crohn disease is another consideration. 3.  Aortic atherosclerosis (ICD10-170.0). 4.  Emphysema (ICD10-J43.9). 5. Hepatic steatosis. Electronically Signed   By: Lorin Picket M.D.   On: 10/14/2017 14:45   Ct Abdomen Pelvis W Contrast  Result Date: 10/14/2017 CLINICAL DATA:  Crohn disease with abscess and bowel surgery. Abdominal discomfort and pelvic pain since March. Leukocytosis. EXAM: CT CHEST, ABDOMEN, AND PELVIS WITH CONTRAST TECHNIQUE: Multidetector CT imaging of the chest, abdomen and pelvis was performed following the standard protocol during bolus administration of intravenous contrast. CONTRAST:  181m ISOVUE-300 IOPAMIDOL (ISOVUE-300) INJECTION 61% COMPARISON:  CT abdomen pelvis 10/15/2009. FINDINGS: CT CHEST FINDINGS Cardiovascular: Although this study was not optimized for the detection of pulmonary emboli, there are filling defects in the lower lobe pulmonary arteries bilaterally, extending peripherally from the lobar level. Atherosclerotic calcification of the arterial vasculature. Heart size normal. No pericardial effusion. Mediastinum/Nodes: No pathologically enlarged mediastinal, hilar or axillary lymph nodes. Esophagus is grossly unremarkable. Lungs/Pleura: Moderate centrilobular and mild paraseptal emphysema. 4 mm smudgy nodule along the minor fissure is likely a subpleural lymph node. Rounded area of subpleural decreased attenuation in the right lower lobe (series 4, image 142) may represent an infarct. Calcified granulomas. No pleural fluid. Airway is unremarkable. Musculoskeletal: Degenerative changes in the spine. No worrisome lytic or sclerotic lesions. CT ABDOMEN PELVIS FINDINGS Hepatobiliary: Liver is decreased in attenuation. Liver and gallbladder are otherwise  unremarkable. No biliary ductal dilatation. Pancreas: Negative. Spleen: Negative. Adrenals/Urinary Tract: Adrenal glands are unremarkable. An intermediate density lesion off the upper pole right kidney measures 2.0 cm and 30 Hounsfield units, previously 8 mm. Additional subcentimeter low-attenuation lesions in the kidneys are too small to characterize. Ureters are decompressed. Bladder is grossly unremarkable. Stomach/Bowel: Gastric bypass. Stomach, small bowel and majority of the colon are otherwise  unremarkable. There is sigmoid wall thickening with minimal adjacent stranding and haziness. No extraluminal air or fluid. Appendix is not readily visualized. Vascular/Lymphatic: Atherosclerotic calcification of the arterial vasculature without abdominal aortic aneurysm. No pathologically enlarged lymph nodes. Reproductive: Prostate is visualized. Other: Small left inguinal hernia contains fat.  No free fluid. Musculoskeletal: Degenerative changes in the spine. No worrisome lytic or sclerotic lesions. IMPRESSION: 1. Bilateral lower lobe pulmonary emboli with a probable peripheral right lower lobe infarct. Critical Value/emergent results were called by telephone at the time of interpretation on 10/14/2017 at 2:44 pm to Dr. Gaylyn Cheers , who verbally acknowledged these results. 2. Sigmoid wall thickening with mild adjacent haziness and stranding, suggesting diverticulitis. Scarring related to Crohn disease is another consideration. 3.  Aortic atherosclerosis (ICD10-170.0). 4.  Emphysema (ICD10-J43.9). 5. Hepatic steatosis. Electronically Signed   By: Lorin Picket M.D.   On: 10/14/2017 14:45    Assessment and plan- Patient is a 62 y.o. male referred for leucocytosis mainly neutrophilia and basophilia.  Patients wbc has increased from 13 in 2015 to 15 in 2016 to 21.8 presently. Differential shows left shift along with neutrophilia and basophilia. He is on entocort which can cause neutrophilia. His CRP in may 2019  was elevated at 15.9 which could also be a marker of inflammation  which can cause leucocytosis and left shift.   However, given the increasing trend in his wbc- I will check cbc with diff, smear review, peripheral flow cytometry, bcr abl fish testing, JAK2 mutation testing and CRP today. Recent CT chest abdomen done last month did not show any evidence of lymphadenopathy/ malignancy.  I will see the patient back in 2 weeks time to discuss the results of his bloodwork   Thank you for this kind referral and the opportunity to participate in the care of this patient   Visit Diagnosis 1. Neutrophilia   2. Basophilia   3. Monocytosis     Dr. Randa Evens, MD, MPH [facility] at [facility] [phone] 11/09/2017  1:43 PM

## 2017-11-10 LAB — HIGH SENSITIVITY CRP: CRP, High Sensitivity: 4.77 mg/L — ABNORMAL HIGH (ref 0.00–3.00)

## 2017-11-11 LAB — COMP PANEL: LEUKEMIA/LYMPHOMA

## 2017-11-13 LAB — JAK2 GENOTYPR

## 2017-11-13 LAB — BCR-ABL1 FISH
CELLS ANALYZED: 200
Cells Counted: 200

## 2017-11-23 ENCOUNTER — Inpatient Hospital Stay: Payer: Medicare PPO | Admitting: Oncology

## 2017-11-23 ENCOUNTER — Telehealth: Payer: Self-pay | Admitting: *Deleted

## 2017-11-23 ENCOUNTER — Inpatient Hospital Stay: Payer: Medicare PPO

## 2017-11-23 ENCOUNTER — Encounter: Payer: Self-pay | Admitting: Oncology

## 2017-11-23 VITALS — BP 165/91 | HR 84 | Temp 99.5°F | Resp 18 | Ht 70.0 in | Wt 174.6 lb

## 2017-11-23 DIAGNOSIS — D72828 Other elevated white blood cell count: Secondary | ICD-10-CM

## 2017-11-23 DIAGNOSIS — D72829 Elevated white blood cell count, unspecified: Secondary | ICD-10-CM | POA: Diagnosis not present

## 2017-11-23 DIAGNOSIS — E876 Hypokalemia: Secondary | ICD-10-CM

## 2017-11-23 DIAGNOSIS — D729 Disorder of white blood cells, unspecified: Secondary | ICD-10-CM

## 2017-11-23 LAB — BASIC METABOLIC PANEL
Anion gap: 11 (ref 5–15)
BUN: 12 mg/dL (ref 8–23)
CHLORIDE: 104 mmol/L (ref 98–111)
CO2: 24 mmol/L (ref 22–32)
Calcium: 9.2 mg/dL (ref 8.9–10.3)
Creatinine, Ser: 0.8 mg/dL (ref 0.61–1.24)
GFR calc Af Amer: >60 mL/min (ref >60)
GFR calc non Af Amer: >60 mL/min (ref >60)
Glucose, Bld: 109 mg/dL — ABNORMAL HIGH (ref 70–99)
POTASSIUM: 5.2 mmol/L — AB (ref 3.5–5.1)
SODIUM: 139 mmol/L (ref 135–145)

## 2017-11-23 NOTE — Progress Notes (Signed)
Pt here to get lab results, no c/o

## 2017-11-23 NOTE — Progress Notes (Signed)
Hematology/Oncology Consult note [facility]  Telephone:([phone] [phone]  Patient Care Team: Lynnell Jude, MD as PCP - General (Family Medicine)   Name of the patient: Brent Diaz  [id number]  01-Dec-1955   Date of visit: 11/23/17  Diagnosis- leucocytosis likely reactive  Chief complaint/ Reason for visit- discuss results of bloodwork  Heme/Onc history: Patient is a 62 yr old male with h/o Chrons disease and sees Dr. Vira Agar for the same. He was on humira in the past and most recently has been on entocort. He was taken off entocort in jan 2018 when he developed extensive shingles. He has had prior bowel surgeries. He recently had ct chest abdomen pelvis with contrast in June 2019 which showed bilateral lower lobe PE and sigmoid wall thickening. Possible diverticulitis. He has also been on long term antibiotics for sinus infections from [facility] - march 2019. He has been referred to [place] for leucocytosis.   Patient has had leucocytosis dating back atleast to 2015 when his wbc was 13.4. It went up to 15 in 2016 followed by 25.7 in may 2019. In July 2019 his wbc was 21.8. Differential mainly showed neutrophilia and basophilia. He has had some monocytosis in the past as well. Left shift was noted. Hb stable at 15. Platelet count has been normal.   Results of blood work from 11/09/2017 were as follows: CBC showed white count of 20.1, H&H of 15.7/47.1 with a platelet count of 251.  Differential mainly showed neutrophilia with an absolute neutrophil count of 17.1 and mild monocytosis with a monocyte count of 1.4.  FISH for BCR able was negative.  Flow cytometry did not reveal any monoclonal B-cell population. There is no loss of or aberrant expression of pan T-cell antigens to suggest a neoplastic T-cell process.  No circulating blasts are detected.  Granulocytes showed slightly left shifted maturation and rare monocytes with aberrant expression of CD 56 a finding  that can be seen in association with both reactive/activated processes as well as neoplastic processes. CRP was elevated at 4.77.  Jak 2 mutation testing was negative.  Smear review showed unremarkable morphology with adequate platelets   Interval history- feels fatigued. Denies any blood in stool or urine. Occasional diarrhea well controlled with imodium. Denies any recent infections  ECOG PS- 1 Pain scale- 0   Review of systems- Review of Systems  Constitutional: Negative for chills, fever, malaise/fatigue and weight loss.  HENT: Negative for congestion, ear discharge and nosebleeds.   Eyes: Negative for blurred vision.  Respiratory: Negative for cough, hemoptysis, sputum production, shortness of breath and wheezing.   Cardiovascular: Negative for chest pain, palpitations, orthopnea and claudication.  Gastrointestinal: Negative for abdominal pain, blood in stool, constipation, diarrhea, heartburn, melena, nausea and vomiting.  Genitourinary: Negative for dysuria, flank pain, frequency, hematuria and urgency.  Musculoskeletal: Negative for back pain, joint pain and myalgias.  Skin: Negative for rash.  Neurological: Negative for dizziness, tingling, focal weakness, seizures, weakness and headaches.  Endo/Heme/Allergies: Does not bruise/bleed easily.  Psychiatric/Behavioral: Negative for depression and suicidal ideas. The patient does not have insomnia.       Allergies  Allergen Reactions  . Amlodipine Swelling    feet  . Aspirin Other (See Comments)    Peptic ulcers  . Flagyl [Metronidazole]     Gives neuropathy if taken too long      Past Medical History:  Diagnosis Date  . Cancer ([place])    skin  . Crohn's disease (  HCC)   . GERD (gastroesophageal reflux disease)   . Hypertension   . Shingles    resolved  . Wears contact lenses    Left eye     Past Surgical History:  Procedure Laterality Date  . ABDOMINAL SURGERY    . BOWEL RESECTION    . HYDROCELE EXCISION /  REPAIR    . IMAGE GUIDED SINUS SURGERY N/A 05/21/2017   Procedure: IMAGE GUIDED SINUS SURGERY;  Surgeon: Margaretha Sheffield, MD;  Location: [place];  Service: ENT;  Laterality: N/A;  need stryker disk  . MAXILLARY ANTROSTOMY Right 05/21/2017   Procedure: MAXILLARY ANTROSTOMY;  Surgeon: Margaretha Sheffield, MD;  Location: [place];  Service: ENT;  Laterality: Right;  . SEPTOPLASTY N/A 05/21/2017   Procedure: SEPTOPLASTY;  Surgeon: Margaretha Sheffield, MD;  Location: [place];  Service: ENT;  Laterality: N/A;    Social History   Socioeconomic History  . Marital status: Married    Spouse name: Not on file  . Number of children: Not on file  . Years of education: Not on file  . Highest education level: Not on file  Occupational History  . Not on file  Social Needs  . Financial resource strain: Not on file  . Food insecurity:    Worry: Not on file    Inability: Not on file  . Transportation needs:    Medical: Not on file    Non-medical: Not on file  Tobacco Use  . Smoking status: Current Every Day Smoker    Packs/day: 0.50    Years: 47.00    Pack years: 23.50    Types: Cigarettes  . Smokeless tobacco: Never Used  . Tobacco comment: since age 60  Substance and Sexual Activity  . Alcohol use: No  . Drug use: Never  . Sexual activity: Not on file  Lifestyle  . Physical activity:    Days per week: Not on file    Minutes per session: Not on file  . Stress: Not on file  Relationships  . Social connections:    Talks on phone: Not on file    Gets together: Not on file    Attends religious service: Not on file    Active member of club or organization: Not on file    Attends meetings of clubs or organizations: Not on file    Relationship status: Not on file  . Intimate partner violence:    Fear of current or ex partner: Not on file    Emotionally abused: Not on file    Physically abused: Not on file    Forced sexual activity: Not on file  Other Topics Concern    . Not on file  Social History Narrative  . Not on file    Family History  Problem Relation Age of Onset  . Aneurysm Father 52  . Dementia Mother 80     Current Outpatient Medications:  .  amoxicillin-clavulanate (AUGMENTIN) 875-125 MG tablet, Take 1 tablet by mouth 2 (two) times daily., Disp: 16 tablet, Rfl: 0 .  apixaban (ELIQUIS) 5 MG TABS tablet, Take 2 tablets (10 mg total) by mouth 2 (two) times daily for 5 days., Disp: 20 tablet, Rfl: 0 .  apixaban (ELIQUIS) 5 MG TABS tablet, Take 1 tablet (5 mg total) by mouth 2 (two) times daily. Start 6/19, Disp: 60 tablet, Rfl: 0 .  B Complex-Folic Acid (B COMPLEX-VITAMIN B12 PO), Take 1,000 mcg by mouth daily. , Disp: , Rfl:  .  budesonide (ENTOCORT EC) 3 MG 24 hr capsule, Take 9 mg by mouth daily. , Disp: , Rfl:  .  fluticasone (FLONASE) 50 MCG/ACT nasal spray, Place 2 sprays daily into both nostrils., Disp: , Rfl:  .  folic acid (FOLVITE) 1 MG tablet, Take 1 mg by mouth daily., Disp: , Rfl: 6 .  gabapentin (NEURONTIN) 800 MG tablet, Take 300 mg by mouth 3 (three) times daily. , Disp: , Rfl:  .  gentamicin ointment (GARAMYCIN) 0.1 %, Apply 1 application as needed topically., Disp: , Rfl:  .  lisinopril (PRINIVIL,ZESTRIL) 10 MG tablet, Take 10 mg daily by mouth., Disp: , Rfl:  .  lisinopril-hydrochlorothiazide (PRINZIDE,ZESTORETIC) 20-12.5 MG tablet, Take 1 tablet by mouth 2 (two) times daily., Disp: , Rfl:  .  loratadine (CLARITIN) 10 MG tablet, Take 10 mg by mouth daily., Disp: , Rfl:  .  Melatonin 5 MG TABS, Take 10 mg by mouth at bedtime as needed., Disp: , Rfl:  .  montelukast (SINGULAIR) 10 MG tablet, Take 10 mg by mouth every morning. , Disp: , Rfl:  .  pantoprazole (PROTONIX) 40 MG tablet, Take 40 mg by mouth 2 (two) times daily., Disp: , Rfl: 6 .  potassium chloride SA (K-DUR,KLOR-CON) 20 MEQ tablet, Take 1 tablet by mouth 2 (two) times daily., Disp: , Rfl:  .  Probiotic Product (ALIGN) 4 MG CAPS, Take 4 mg daily by mouth., Disp: ,  Rfl:  .  promethazine (PHENERGAN) 25 MG tablet, Take 25 mg by mouth every 6 (six) hours as needed for nausea or vomiting., Disp: , Rfl:  .  tobramycin (TOBREX) 0.3 % ophthalmic solution, every 4 (four) hours. Use in sinus rinse, Disp: , Rfl:  .  valACYclovir (VALTREX) 500 MG tablet, Take 500 mg 2 (two) times daily by mouth., Disp: , Rfl:  .  Vitamin D, Ergocalciferol, (DRISDOL) 50000 units CAPS capsule, Take 50,000 Units by mouth every 7 (seven) days., Disp: , Rfl:  .  zolpidem (AMBIEN) 10 MG tablet, Take 10 mg by mouth at bedtime as needed for sleep., Disp: , Rfl:   Physical exam:  Vitals:   11/23/17 0953  BP: (!) 165/91  Pulse: 84  Resp: 18  Temp: 99.5 F (37.5 C)  TempSrc: Tympanic  Weight: 174 lb 9.7 oz (79.2 kg)  Height: 5' 10" (1.778 m)   Physical Exam  Constitutional: He is oriented to person, place, and time.  HENT:  Head: Normocephalic and atraumatic.  Eyes: Pupils are equal, round, and reactive to light. EOM are normal.  Neck: Normal range of motion.  Cardiovascular: Normal rate, regular rhythm and normal heart sounds.  Pulmonary/Chest: Effort normal and breath sounds normal.  Abdominal: Soft. Bowel sounds are normal.  Neurological: He is alert and oriented to person, place, and time.  Skin: Skin is warm and dry.     CMP Latest Ref Rng & Units 10/15/2017  Glucose 65 - 99 mg/dL 89  BUN 6 - 20 mg/dL 10  Creatinine 0.61 - 1.24 mg/dL 0.73  Sodium 135 - 145 mmol/L 140  Potassium 3.5 - 5.1 mmol/L 3.8  Chloride 101 - 111 mmol/L 108  CO2 22 - 32 mmol/L 27  Calcium 8.9 - 10.3 mg/dL 8.4(L)   CBC Latest Ref Rng & Units 11/09/2017  WBC 3.8 - 10.6 K/uL 20.1(H)  Hemoglobin 13.0 - 18.0 g/dL 15.7  Hematocrit 40.0 - 52.0 % 47.1  Platelets 150 - 440 K/uL 251     Assessment and plan- Patient is a 62 y.o.  male referred for leucocytosis likely reactive  I discussed the patient in detail.  Patient has had elevated white count even dating back to 1 year ago.  His white count  fluctuates from being normal to as high as 20.  Differential mainly shows neutrophilia.  Results of flow cytometry, BCR able testing Jak 2 mutation testing were unremarkable.  There was some aberrant expression of CD 56 seen on rare monocytes.  However given that his white count has not been steadily trending up, I am not inclined to do a bone marrow biopsy at this time.  Given his elevated CRP I seen that his leukocytosis/neutrophilia is likely reactive and it may be worthwhile for GI to proceed with a colonoscopy or other imaging studies as deemed appropriate to determine his status of inflammatory bowel disease to see if it is contributing to his elevated white count.  I will repeat his CBC with differential in 2 months in 4 months and I will see him back in 4 months.  If there is a consistent increasing trend in his white count I will consider a bone marrow biopsy at that time  Patient was also noted to have a low potassium level and has been prescribed oral potassium by GI.  They would like [place] to do a BMP at this time which we will order and send the results to them as well   Visit Diagnosis 1. Hypokalemia   2. Neutrophilia      Dr. Randa Evens, MD, MPH [facility] at [facility] [phone] 11/23/2017 11:54 AM

## 2017-11-23 NOTE — Telephone Encounter (Signed)
Patient has been having potassium dose changed based on lab values and asked if it could be done today or he would have to go to [facility] today and get it done. I called and spoke to [employer] and told her the potassium 5.3 and she sees it  to under care everywhere and she will tell Dr. Vira Agar and let patient know if there are any changes

## 2018-01-25 ENCOUNTER — Inpatient Hospital Stay: Payer: Medicare PPO | Attending: Oncology

## 2018-01-25 ENCOUNTER — Other Ambulatory Visit: Payer: Self-pay | Admitting: *Deleted

## 2018-01-25 DIAGNOSIS — E876 Hypokalemia: Secondary | ICD-10-CM

## 2018-01-25 DIAGNOSIS — D729 Disorder of white blood cells, unspecified: Secondary | ICD-10-CM

## 2018-01-25 DIAGNOSIS — D72828 Other elevated white blood cell count: Secondary | ICD-10-CM | POA: Insufficient documentation

## 2018-01-25 LAB — CBC WITH DIFFERENTIAL/PLATELET
Basophils Absolute: 0.1 10*3/uL (ref 0–0.1)
Basophils Relative: 1 %
EOS ABS: 0.1 10*3/uL (ref 0–0.7)
EOS PCT: 1 %
HCT: 42.8 % (ref 40.0–52.0)
Hemoglobin: 14.5 g/dL (ref 13.0–18.0)
LYMPHS PCT: 17 %
Lymphs Abs: 3.4 10*3/uL (ref 1.0–3.6)
MCH: 32.6 pg (ref 26.0–34.0)
MCHC: 34 g/dL (ref 32.0–36.0)
MCV: 95.9 fL (ref 80.0–100.0)
MONO ABS: 1.6 10*3/uL — AB (ref 0.2–1.0)
Monocytes Relative: 8 %
Neutro Abs: 14.5 10*3/uL — ABNORMAL HIGH (ref 1.4–6.5)
Neutrophils Relative %: 73 %
PLATELETS: 268 10*3/uL (ref 150–440)
RBC: 4.46 MIL/uL (ref 4.40–5.90)
RDW: 14.2 % (ref 11.5–14.5)
WBC: 19.7 10*3/uL — ABNORMAL HIGH (ref 3.8–10.6)

## 2018-01-25 LAB — POTASSIUM: POTASSIUM: 3.3 mmol/L — AB (ref 3.5–5.1)

## 2018-01-25 NOTE — Progress Notes (Signed)
otassium

## 2018-03-22 ENCOUNTER — Inpatient Hospital Stay: Payer: Medicare PPO

## 2018-03-22 ENCOUNTER — Inpatient Hospital Stay: Payer: Medicare PPO | Attending: Oncology | Admitting: Oncology

## 2018-03-22 ENCOUNTER — Encounter: Payer: Self-pay | Admitting: Oncology

## 2018-03-22 VITALS — BP 144/99 | HR 89 | Temp 98.2°F | Resp 18 | Ht 70.0 in | Wt 174.0 lb

## 2018-03-22 DIAGNOSIS — D729 Disorder of white blood cells, unspecified: Secondary | ICD-10-CM

## 2018-03-22 DIAGNOSIS — Z79899 Other long term (current) drug therapy: Secondary | ICD-10-CM | POA: Diagnosis not present

## 2018-03-22 DIAGNOSIS — E876 Hypokalemia: Secondary | ICD-10-CM

## 2018-03-22 DIAGNOSIS — Z720 Tobacco use: Secondary | ICD-10-CM

## 2018-03-22 DIAGNOSIS — D72829 Elevated white blood cell count, unspecified: Secondary | ICD-10-CM | POA: Diagnosis not present

## 2018-03-22 LAB — CBC WITH DIFFERENTIAL/PLATELET
Abs Immature Granulocytes: 1.36 10*3/uL — ABNORMAL HIGH (ref 0.00–0.07)
BASOS ABS: 0.1 10*3/uL (ref 0.0–0.1)
BASOS PCT: 0 %
EOS ABS: 0.1 10*3/uL (ref 0.0–0.5)
Eosinophils Relative: 1 %
HCT: 46.4 % (ref 39.0–52.0)
Hemoglobin: 15 g/dL (ref 13.0–17.0)
IMMATURE GRANULOCYTES: 6 %
Lymphocytes Relative: 15 %
Lymphs Abs: 3.5 10*3/uL (ref 0.7–4.0)
MCH: 31.4 pg (ref 26.0–34.0)
MCHC: 32.3 g/dL (ref 30.0–36.0)
MCV: 97.1 fL (ref 80.0–100.0)
Monocytes Absolute: 2.1 10*3/uL — ABNORMAL HIGH (ref 0.1–1.0)
Monocytes Relative: 9 %
NRBC: 0 % (ref 0.0–0.2)
Neutro Abs: 16.1 10*3/uL — ABNORMAL HIGH (ref 1.7–7.7)
Neutrophils Relative %: 69 %
PLATELETS: 302 10*3/uL (ref 150–400)
RBC: 4.78 MIL/uL (ref 4.22–5.81)
RDW: 13.5 % (ref 11.5–15.5)
WBC: 23.4 10*3/uL — AB (ref 4.0–10.5)

## 2018-03-22 LAB — POTASSIUM: POTASSIUM: 4.5 mmol/L (ref 3.5–5.1)

## 2018-03-22 NOTE — Progress Notes (Signed)
No new changes noted today 

## 2018-03-22 NOTE — Progress Notes (Signed)
Hematology/Oncology Consult note [facility]  Telephone:([phone] Fax:([phone]  Patient Care Team: Lynnell Jude, MD as PCP - General (Family Medicine)   Name of the patient: Brent Diaz  [id number]  [birth year]   Date of visit: 03/22/18  Diagnosis-leukocytosis mainly neutrophilia likely reactive  Chief complaint/ Reason for visit-routine follow-up of leukocytosis  Heme/Onc history: Patient is a 62 yr old male with h/o Chrons disease and sees Dr. Vira Agar for the same. He was on humira in the past and most recently has been on entocort. He was taken off entocort in jan 2018 when he developed extensive shingles. He has had prior bowel surgeries. He recently had ct chest abdomen pelvis with contrast in June 2019 which showed bilateral lower lobe PE and sigmoid wall thickening. Possible diverticulitis. He has also been on long term antibiotics for sinus infections from [facility] - march 2019. He has been referred to [place] for leucocytosis.   Patient has had leucocytosis dating back atleast to 2015 when his wbc was 13.4. It went up to 15 in 2016 followed by 25.7 in may 2019. In July 2019 his wbc was 21.8. Differential mainly showed neutrophilia and basophilia. He has had some monocytosis in the past as well. Left shift was noted. Hb stable at 15. Platelet count has been normal.   Results of blood work from 11/09/2017 were as follows: CBC showed white count of 20.1, H&H of 15.7/47.1 with a platelet count of 251.  Differential mainly showed neutrophilia with an absolute neutrophil count of 17.1 and mild monocytosis with a monocyte count of 1.4.  FISH for BCR able was negative.  Flow cytometry did not reveal any monoclonal B-cell population. There is no loss of or aberrant expression of pan T-cell antigens to suggest a neoplastic T-cell process.  No circulating blasts are detected.  Granulocytes showed slightly left shifted maturation and rare monocytes with aberrant  expression of CD 56 a finding that can be seen in association with both reactive/activated processes as well as neoplastic processes. CRP was elevated at 4.77.  Jak 2 mutation testing was negative.  Smear review showed unremarkable morphology with adequate platelets   Interval history-patient states that since the last visit she he underwent colonoscopy by Dr. Vira Agar was not found to have any active Crohn's.  He continued to night.  He was also seen by Dr. Pryor Ochoa from ENT for his symptoms of chronic sinusitis and had his sinuses examined and there was no evidence of infection found there as well.  Overall he feels well and denies any specific complaints today.  Her appetite is fair and other than occasional fatigue he has not had any drenching night sweats or unintentional weight loss  ECOG PS- 1 Pain scale- 0 Opioid associated constipation- no  Review of systems- Review of Systems  Constitutional: Positive for malaise/fatigue. Negative for chills, fever and weight loss.  HENT: Negative for congestion, ear discharge and nosebleeds.   Eyes: Negative for blurred vision.  Respiratory: Negative for cough, hemoptysis, sputum production, shortness of breath and wheezing.   Cardiovascular: Negative for chest pain, palpitations, orthopnea and claudication.  Gastrointestinal: Negative for abdominal pain, blood in stool, constipation, diarrhea, heartburn, melena, nausea and vomiting.  Genitourinary: Negative for dysuria, flank pain, frequency, hematuria and urgency.  Musculoskeletal: Negative for back pain, joint pain and myalgias.  Skin: Negative for rash.  Neurological: Negative for dizziness, tingling, focal weakness, seizures, weakness and headaches.  Endo/Heme/Allergies: Does not bruise/bleed easily.  Psychiatric/Behavioral: Negative  for depression and suicidal ideas. The patient does not have insomnia.       Allergies  Allergen Reactions  . Amlodipine Swelling    feet  . Aspirin Other (See  Comments)    Peptic ulcers  . Flagyl [Metronidazole]     Gives neuropathy if taken too long      Past Medical History:  Diagnosis Date  . Cancer ([place])    skin  . Crohn's disease ([place])   . GERD (gastroesophageal reflux disease)   . Hypertension   . Neutrophilia   . Shingles    resolved  . Wears contact lenses    Left eye     Past Surgical History:  Procedure Laterality Date  . ABDOMINAL SURGERY    . BOWEL RESECTION    . HYDROCELE EXCISION / REPAIR    . IMAGE GUIDED SINUS SURGERY N/A 05/21/2017   Procedure: IMAGE GUIDED SINUS SURGERY;  Surgeon: Margaretha Sheffield, MD;  Location: [place];  Service: ENT;  Laterality: N/A;  need stryker disk  . MAXILLARY ANTROSTOMY Right 05/21/2017   Procedure: MAXILLARY ANTROSTOMY;  Surgeon: Margaretha Sheffield, MD;  Location: [place];  Service: ENT;  Laterality: Right;  . SEPTOPLASTY N/A 05/21/2017   Procedure: SEPTOPLASTY;  Surgeon: Margaretha Sheffield, MD;  Location: [place];  Service: ENT;  Laterality: N/A;    Social History   Socioeconomic History  . Marital status: Married    Spouse name: Not on file  . Number of children: Not on file  . Years of education: Not on file  . Highest education level: Not on file  Occupational History  . Not on file  Social Needs  . Financial resource strain: Not on file  . Food insecurity:    Worry: Not on file    Inability: Not on file  . Transportation needs:    Medical: Not on file    Non-medical: Not on file  Tobacco Use  . Smoking status: Current Every Day Smoker    Packs/day: 0.50    Years: 47.00    Pack years: 23.50    Types: Cigarettes  . Smokeless tobacco: Never Used  . Tobacco comment: since age 37  Substance and Sexual Activity  . Alcohol use: No  . Drug use: Never  . Sexual activity: Not on file  Lifestyle  . Physical activity:    Days per week: Not on file    Minutes per session: Not on file  . Stress: Not on file  Relationships  . Social connections:      Talks on phone: Not on file    Gets together: Not on file    Attends religious service: Not on file    Active member of club or organization: Not on file    Attends meetings of clubs or organizations: Not on file    Relationship status: Not on file  . Intimate partner violence:    Fear of current or ex partner: Not on file    Emotionally abused: Not on file    Physically abused: Not on file    Forced sexual activity: Not on file  Other Topics Concern  . Not on file  Social History Narrative  . Not on file    Family History  Problem Relation Age of Onset  . Aneurysm Father 49  . Dementia Mother 67     Current Outpatient Medications:  .  apixaban (ELIQUIS) 5 MG TABS tablet, Take 1 tablet (5 mg total) by mouth 2 (  two) times daily. Start 6/19, Disp: 60 tablet, Rfl: 0 .  B Complex-Folic Acid (B COMPLEX-VITAMIN B12 PO), Take 1,000 mcg by mouth daily. , Disp: , Rfl:  .  budesonide (ENTOCORT EC) 3 MG 24 hr capsule, Take 9 mg by mouth daily. , Disp: , Rfl:  .  cycloSPORINE (RESTASIS) 0.05 % ophthalmic emulsion, Place 1 drop into the right eye 2 (two) times daily., Disp: , Rfl:  .  folic acid (FOLVITE) 1 MG tablet, Take 1 mg by mouth daily., Disp: , Rfl: 6 .  gabapentin (NEURONTIN) 800 MG tablet, Take 300 mg by mouth 3 (three) times daily. , Disp: , Rfl:  .  lisinopril (PRINIVIL,ZESTRIL) 10 MG tablet, Take 10 mg daily by mouth., Disp: , Rfl:  .  lisinopril-hydrochlorothiazide (PRINZIDE,ZESTORETIC) 20-12.5 MG tablet, Take 1 tablet by mouth daily. , Disp: , Rfl:  .  loratadine (CLARITIN) 10 MG tablet, Take 10 mg by mouth daily., Disp: , Rfl:  .  montelukast (SINGULAIR) 10 MG tablet, Take 10 mg by mouth every morning. , Disp: , Rfl:  .  pantoprazole (PROTONIX) 40 MG tablet, Take 40 mg by mouth 2 (two) times daily., Disp: , Rfl: 6 .  Probiotic Product (ALIGN) 4 MG CAPS, Take 4 mg daily by mouth., Disp: , Rfl:  .  tobramycin (TOBREX) 0.3 % ophthalmic solution, every 4 (four) hours. Use in  sinus rinse, Disp: , Rfl:  .  triamcinolone (NASACORT ALLERGY 24HR) 55 MCG/ACT AERO nasal inhaler, Place 2 sprays into the nose daily., Disp: , Rfl:  .  valACYclovir (VALTREX) 500 MG tablet, Take 500 mg 2 (two) times daily by mouth., Disp: , Rfl:  .  vitamin B-12 (CYANOCOBALAMIN) 1000 MCG tablet, Take 1,000 mcg by mouth daily., Disp: , Rfl:  .  Vitamin D, Ergocalciferol, (DRISDOL) 50000 units CAPS capsule, Take 50,000 Units by mouth every 7 (seven) days., Disp: , Rfl:  .  zolpidem (AMBIEN) 10 MG tablet, Take 10 mg by mouth at bedtime as needed for sleep., Disp: , Rfl:  .  amoxicillin-clavulanate (AUGMENTIN) 875-125 MG tablet, Take 1 tablet by mouth 2 (two) times daily. (Patient not taking: Reported on 03/22/2018), Disp: 16 tablet, Rfl: 0 .  apixaban (ELIQUIS) 5 MG TABS tablet, Take 2 tablets (10 mg total) by mouth 2 (two) times daily for 5 days., Disp: 20 tablet, Rfl: 0 .  fluticasone (FLONASE) 50 MCG/ACT nasal spray, Place 2 sprays daily into both nostrils., Disp: , Rfl:  .  gentamicin ointment (GARAMYCIN) 0.1 %, Apply 1 application as needed topically., Disp: , Rfl:  .  loperamide (IMODIUM) 2 MG capsule, Take 2 mg by mouth as needed for diarrhea or loose stools., Disp: , Rfl:  .  Melatonin 5 MG TABS, Take 10 mg by mouth at bedtime as needed., Disp: , Rfl:  .  potassium chloride SA (K-DUR,KLOR-CON) 20 MEQ tablet, Take 1 tablet by mouth daily. , Disp: , Rfl:  .  promethazine (PHENERGAN) 25 MG tablet, Take 25 mg by mouth every 6 (six) hours as needed for nausea or vomiting., Disp: , Rfl:   Physical exam:  Vitals:   03/22/18 0944  BP: (!) 144/99  Pulse: 89  Resp: 18  Temp: 98.2 F (36.8 C)  TempSrc: Tympanic  SpO2: 99%  Weight: 174 lb (78.9 kg)  Height: 5' 10"  (1.778 m)   Physical Exam  Constitutional: He is oriented to person, place, and time.  HENT:  Head: Normocephalic and atraumatic.  Eyes: Pupils are equal, round, and reactive to  light. EOM are normal.  Neck: Normal range of  motion.  Cardiovascular: Normal rate, regular rhythm and normal heart sounds.  Pulmonary/Chest: Effort normal and breath sounds normal.  Abdominal: Soft. Bowel sounds are normal.  Neurological: He is alert and oriented to person, place, and time.  Skin: Skin is warm and dry.     CMP Latest Ref Rng & Units 03/22/2018  Glucose 70 - 99 mg/dL -  BUN 8 - 23 mg/dL -  Creatinine 0.61 - 1.24 mg/dL -  Sodium 135 - 145 mmol/L -  Potassium 3.5 - 5.1 mmol/L 4.5  Chloride 98 - 111 mmol/L -  CO2 22 - 32 mmol/L -  Calcium 8.9 - 10.3 mg/dL -   CBC Latest Ref Rng & Units 03/22/2018  WBC 4.0 - 10.5 K/uL 23.4(H)  Hemoglobin 13.0 - 17.0 g/dL 15.0  Hematocrit 39.0 - 52.0 % 46.4  Platelets 150 - 400 K/uL 302      Assessment and plan- Patient is a 62 y.o. male with leukocytosis mainly neutrophilia likely reactive  Patient is white count has stayed between 19-20 now up to 23 over the last 1 year.  Differential mainly shows neutrophilia.  Jak 2 mutation testing as well as BCR able testing was negative.  Patient tells me that he recently had a sinus evaluated as well as a full colonoscopy which did not reveal any evidence of active sinusitis or active Crohn's at this time.  Patient is on oral budesonide which in small cases can cause leukocytosis.  However given that there is a small increase in the trend of his leukocytosis which is now trending up in the 20s I would like to get a bone marrow biopsy to rule out any myeloproliferative disorder.  Discussed briefly what bone marrow biopsy is and how the procedure is done.  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  Typically IR is comfortable doing this procedure without having to interrupt his anticoagulation.  I will tentatively schedule his bone marrow biopsy in the first week of December and see him after that  I will also obtain intelligent myeloid  panel testing from his peripheral blood or bone marrow at the time of bone marrow biopsy.  Patient requests his potassium to be checked at the request of his primary care doctor which we will do today   Visit Diagnosis 1. Hypokalemia   2. Neutrophilia   3. Leukocytosis, unspecified type      Dr. Randa Evens, MD, MPH [facility] at [facility] [phone] 03/22/2018 11:34 AM

## 2018-03-23 ENCOUNTER — Telehealth: Payer: Self-pay | Admitting: *Deleted

## 2018-03-23 NOTE — Telephone Encounter (Signed)
Called patient's house and got the voicemail left a message that I have scheduled patient for his bone marrow biopsy on December 2 he will arrive at the medical mall at [facility] at 8 AM for a 9 AM procedure.  Patient was explained that he will have to be n.p.o. after midnight the night before his procedure.  Also if he has any meds that he has to take the day of the procedure he taken with a sip of water.  Also notify the patient that he will have to have a driver for the procedure.  And he will be there a total of 2 to 4 hours.  Also left a message that patient can call back if he has any questions.  Also he has a follow-up appointment made for December 9 and [place] at 1130 to see Dr. Janese Banks for the results.

## 2018-04-01 ENCOUNTER — Other Ambulatory Visit: Payer: Self-pay | Admitting: Radiology

## 2018-04-05 ENCOUNTER — Ambulatory Visit
Admission: RE | Admit: 2018-04-05 | Discharge: 2018-04-05 | Disposition: A | Discharge status: Home / Self Care | Payer: Medicare PPO | Source: Ambulatory Visit | Attending: Oncology | Admitting: Oncology

## 2018-04-05 ENCOUNTER — Other Ambulatory Visit (HOSPITAL_COMMUNITY)
Admission: RE | Admit: 2018-04-05 | Disposition: A | Discharge status: Home / Self Care | Payer: Medicare PPO | Source: Ambulatory Visit | Attending: Oncology | Admitting: Oncology

## 2018-04-05 DIAGNOSIS — D729 Disorder of white blood cells, unspecified: Secondary | ICD-10-CM | POA: Insufficient documentation

## 2018-04-05 DIAGNOSIS — D72829 Elevated white blood cell count, unspecified: Secondary | ICD-10-CM | POA: Insufficient documentation

## 2018-04-05 DIAGNOSIS — D72821 Monocytosis (symptomatic): Secondary | ICD-10-CM | POA: Insufficient documentation

## 2018-04-05 LAB — CBC WITH DIFFERENTIAL/PLATELET
Abs Immature Granulocytes: 0.99 10*3/uL — ABNORMAL HIGH (ref 0.00–0.07)
BASOS PCT: 1 %
Basophils Absolute: 0.1 10*3/uL (ref 0.0–0.1)
EOS ABS: 0.1 10*3/uL (ref 0.0–0.5)
Eosinophils Relative: 1 %
HCT: 46.4 % (ref 39.0–52.0)
Hemoglobin: 15.1 g/dL (ref 13.0–17.0)
IMMATURE GRANULOCYTES: 5 %
Lymphocytes Relative: 22 %
Lymphs Abs: 4 10*3/uL (ref 0.7–4.0)
MCH: 31 pg (ref 26.0–34.0)
MCHC: 32.5 g/dL (ref 30.0–36.0)
MCV: 95.3 fL (ref 80.0–100.0)
Monocytes Absolute: 1.4 10*3/uL — ABNORMAL HIGH (ref 0.1–1.0)
Monocytes Relative: 8 %
NEUTROS PCT: 63 %
Neutro Abs: 11.6 10*3/uL — ABNORMAL HIGH (ref 1.7–7.7)
PLATELETS: 295 10*3/uL (ref 150–400)
RBC: 4.87 MIL/uL (ref 4.22–5.81)
RDW: 13.4 % (ref 11.5–15.5)
WBC: 18.2 10*3/uL — AB (ref 4.0–10.5)
nRBC: 0 % (ref 0.0–0.2)

## 2018-04-05 LAB — PROTIME-INR
INR: 0.97
Prothrombin Time: 12.8 seconds (ref 11.4–15.2)

## 2018-04-05 MED ORDER — FENTANYL CITRATE (PF) 100 MCG/2ML IJ SOLN
INTRAMUSCULAR | Status: AC | PRN
  Administered 2018-04-05 (×3): 50 ug via INTRAVENOUS

## 2018-04-05 MED ORDER — FENTANYL CITRATE (PF) 100 MCG/2ML IJ SOLN
INTRAMUSCULAR | Status: AC
  Filled 2018-04-05: qty 4

## 2018-04-05 MED ORDER — SODIUM CHLORIDE 0.9 % IV SOLN
INTRAVENOUS | Status: DC
  Administered 2018-04-05: 08:00:00 via INTRAVENOUS

## 2018-04-05 MED ORDER — HYDROCODONE-ACETAMINOPHEN 5-325 MG PO TABS: 1.0000 | ORAL_TABLET | ORAL | Status: DC | PRN

## 2018-04-05 MED ORDER — MIDAZOLAM HCL 2 MG/2ML IJ SOLN
INTRAMUSCULAR | Status: AC | PRN
  Administered 2018-04-05 (×3): 1 mg via INTRAVENOUS

## 2018-04-05 MED ORDER — MIDAZOLAM HCL 5 MG/5ML IJ SOLN
INTRAMUSCULAR | Status: AC
  Filled 2018-04-05: qty 5

## 2018-04-05 MED ORDER — MIDAZOLAM HCL 5 MG/5ML IJ SOLN
INTRAMUSCULAR | Status: AC | PRN
  Administered 2018-04-05: 1 mg via INTRAVENOUS

## 2018-04-05 MED ORDER — HEPARIN SOD (PORK) LOCK FLUSH 100 UNIT/ML IV SOLN
INTRAVENOUS | Status: AC
  Filled 2018-04-05: qty 5

## 2018-04-05 NOTE — Procedures (Signed)
  Procedure: CT bone marrow biopsy L iliac EBL:   minimal Complications:  none immediate  See full dictation in [facility].  Dillard Cannon MD Main # [phone] Pager  [phone]

## 2018-04-12 ENCOUNTER — Encounter: Payer: Self-pay | Admitting: Oncology

## 2018-04-12 ENCOUNTER — Inpatient Hospital Stay: Payer: Medicare PPO | Attending: Oncology | Admitting: Oncology

## 2018-04-12 VITALS — BP 131/84 | HR 94 | Temp 98.1°F | Resp 16 | Ht 70.0 in | Wt 175.6 lb

## 2018-04-12 DIAGNOSIS — Z720 Tobacco use: Secondary | ICD-10-CM | POA: Diagnosis not present

## 2018-04-12 DIAGNOSIS — J329 Chronic sinusitis, unspecified: Secondary | ICD-10-CM | POA: Diagnosis not present

## 2018-04-12 DIAGNOSIS — D72829 Elevated white blood cell count, unspecified: Secondary | ICD-10-CM | POA: Diagnosis not present

## 2018-04-15 NOTE — Progress Notes (Signed)
Hematology/Oncology Consult note [facility]  Telephone:([phone] [phone]  Patient Care Team: Lynnell Jude, MD as PCP - General (Family Medicine)   Name of the patient: Brent Diaz  [id number]  [birth year]   Date of visit: 04/15/18  Diagnosis-leukocytosis likely reactive  Chief complaint/ Reason for visit-discuss results of bone marrow biopsy  Heme/Onc history: Patient is a 62 yr old male with h/o Chrons disease and sees Dr. Vira Agar for the same. He was on humira in the past and most recently has been on entocort. He was taken off entocort in jan 2018 when he developed extensive shingles. He has had prior bowel surgeries. He recently had ct chest abdomen pelvis with contrast in June 2019 which showed bilateral lower lobe PE and sigmoid wall thickening. Possible diverticulitis. He has also been on long term antibiotics for sinus infections from [facility] - march 2019. He has been referred to [place] for leucocytosis.   Patient has had leucocytosis dating back atleast to 2015 when his wbc was 13.4. It went up to 15 in 2016 followed by 25.7 in may 2019. In July 2019 his wbc was 21.8. Differential mainly showed neutrophilia and basophilia. He has had some monocytosis in the past as well. Left shift was noted. Hb stable at 15. Platelet count has been normal.   Results of blood work from 11/09/2017 were as follows: CBC showed white count of 20.1, H&H of 15.7/47.1 with a platelet count of 251. Differential mainly showed neutrophilia with an absolute neutrophil count of 17.1 and mild monocytosis with a monocyte count of 1.4. FISH for BCR able was negative. Flow cytometry did not reveal any monoclonal B-cell population. There is no loss of or aberrant expression of pan T-cell antigens to suggest a neoplastic T-cell process. No circulating blasts are detected. Granulocytes showed slightly left shifted maturation and rare monocytes with aberrant expression of CD 56 a  finding that can be seen in association with both reactive/activated processes as well as neoplastic processes. CRP was elevated at 4.77. Jak 2 mutation testing was negative. Smear review showed unremarkable morphology with adequate platelets  Bone marrow biopsy showed a normocellular marrow with trilineage hematopoiesis.  The peripheral blood and bone marrow features are not considered specific for diagnostic of a myeloid neoplasm and may be secondary in nature.  Interval history-he still has recurrent issues with sinusitis at this time for which he sees ENT.  Denies any fever, blood in his stools or diarrhea  ECOG PS- 1 Pain scale- 0 Opioid associated constipation- no  Review of systems- Review of Systems  Constitutional: Negative for chills, fever, malaise/fatigue and weight loss.  HENT: Positive for congestion and sinus pain. Negative for ear discharge and nosebleeds.   Eyes: Negative for blurred vision.  Respiratory: Negative for cough, hemoptysis, sputum production, shortness of breath and wheezing.   Cardiovascular: Negative for chest pain, palpitations, orthopnea and claudication.  Gastrointestinal: Negative for abdominal pain, blood in stool, constipation, diarrhea, heartburn, melena, nausea and vomiting.  Genitourinary: Negative for dysuria, flank pain, frequency, hematuria and urgency.  Musculoskeletal: Negative for back pain, joint pain and myalgias.  Skin: Negative for rash.  Neurological: Negative for dizziness, tingling, focal weakness, seizures, weakness and headaches.  Endo/Heme/Allergies: Does not bruise/bleed easily.  Psychiatric/Behavioral: Negative for depression and suicidal ideas. The patient does not have insomnia.       Allergies  Allergen Reactions  . Amlodipine Swelling    feet  . Aspirin Other (See Comments)  Peptic ulcers  . Flagyl [Metronidazole]     Gives neuropathy if taken too long      Past Medical History:  Diagnosis Date  . Cancer ([place])     skin  . Crohn's disease ([place])   . GERD (gastroesophageal reflux disease)   . Hypertension   . Neutrophilia   . Shingles    resolved  . Wears contact lenses    Left eye     Past Surgical History:  Procedure Laterality Date  . ABDOMINAL SURGERY    . BOWEL RESECTION    . HYDROCELE EXCISION / REPAIR    . IMAGE GUIDED SINUS SURGERY N/A 05/21/2017   Procedure: IMAGE GUIDED SINUS SURGERY;  Surgeon: Margaretha Sheffield, MD;  Location: [place];  Service: ENT;  Laterality: N/A;  need stryker disk  . MAXILLARY ANTROSTOMY Right 05/21/2017   Procedure: MAXILLARY ANTROSTOMY;  Surgeon: Margaretha Sheffield, MD;  Location: [place];  Service: ENT;  Laterality: Right;  . SEPTOPLASTY N/A 05/21/2017   Procedure: SEPTOPLASTY;  Surgeon: Margaretha Sheffield, MD;  Location: [place];  Service: ENT;  Laterality: N/A;    Social History   Socioeconomic History  . Marital status: Married    Spouse name: Not on file  . Number of children: Not on file  . Years of education: Not on file  . Highest education level: Not on file  Occupational History  . Not on file  Social Needs  . Financial resource strain: Not on file  . Food insecurity:    Worry: Not on file    Inability: Not on file  . Transportation needs:    Medical: Not on file    Non-medical: Not on file  Tobacco Use  . Smoking status: Current Every Day Smoker    Packs/day: 0.50    Years: 47.00    Pack years: 23.50    Types: Cigarettes  . Smokeless tobacco: Never Used  . Tobacco comment: since age 12  Substance and Sexual Activity  . Alcohol use: No  . Drug use: Never  . Sexual activity: Not on file  Lifestyle  . Physical activity:    Days per week: Not on file    Minutes per session: Not on file  . Stress: Not on file  Relationships  . Social connections:    Talks on phone: Not on file    Gets together: Not on file    Attends religious service: Not on file    Active member of club or organization: Not on file      Attends meetings of clubs or organizations: Not on file    Relationship status: Not on file  . Intimate partner violence:    Fear of current or ex partner: Not on file    Emotionally abused: Not on file    Physically abused: Not on file    Forced sexual activity: Not on file  Other Topics Concern  . Not on file  Social History Narrative  . Not on file    Family History  Problem Relation Age of Onset  . Aneurysm Father 60  . Dementia Mother 72     Current Outpatient Medications:  .  apixaban (ELIQUIS) 5 MG TABS tablet, Take 1 tablet (5 mg total) by mouth 2 (two) times daily. Start 6/19, Disp: 60 tablet, Rfl: 0 .  B Complex-Folic Acid (B COMPLEX-VITAMIN B12 PO), Take 1,000 mcg by mouth daily. , Disp: , Rfl:  .  budesonide (ENTOCORT EC) 3 MG 24  hr capsule, Take 6 mg by mouth daily. , Disp: , Rfl:  .  cycloSPORINE (RESTASIS) 0.05 % ophthalmic emulsion, Place 1 drop into the right eye 2 (two) times daily., Disp: , Rfl:  .  folic acid (FOLVITE) 1 MG tablet, Take 1 mg by mouth daily., Disp: , Rfl: 6 .  gentamicin ointment (GARAMYCIN) 0.1 %, Apply 1 application as needed topically., Disp: , Rfl:  .  lisinopril-hydrochlorothiazide (PRINZIDE,ZESTORETIC) 20-12.5 MG tablet, Take 1 tablet by mouth daily. , Disp: , Rfl:  .  loperamide (IMODIUM) 2 MG capsule, Take 2 mg by mouth as needed for diarrhea or loose stools., Disp: , Rfl:  .  Melatonin 5 MG TABS, Take 10 mg by mouth at bedtime as needed., Disp: , Rfl:  .  montelukast (SINGULAIR) 10 MG tablet, Take 10 mg by mouth every morning. , Disp: , Rfl:  .  pantoprazole (PROTONIX) 40 MG tablet, Take 40 mg by mouth 2 (two) times daily., Disp: , Rfl: 6 .  potassium chloride SA (K-DUR,KLOR-CON) 20 MEQ tablet, Take 1 tablet by mouth daily. , Disp: , Rfl:  .  Probiotic Product (ALIGN) 4 MG CAPS, Take 4 mg daily by mouth., Disp: , Rfl:  .  vitamin B-12 (CYANOCOBALAMIN) 1000 MCG tablet, Take 1,000 mcg by mouth daily., Disp: , Rfl:  .  Vitamin D,  Ergocalciferol, (DRISDOL) 50000 units CAPS capsule, Take 50,000 Units by mouth every 7 (seven) days., Disp: , Rfl:  .  zolpidem (AMBIEN) 10 MG tablet, Take 10 mg by mouth at bedtime as needed for sleep., Disp: , Rfl:   Physical exam:  Vitals:   04/12/18 1142  BP: 131/84  Pulse: 94  Resp: 16  Temp: 98.1 F (36.7 C)  TempSrc: Tympanic  Weight: 175 lb 9.5 oz (79.6 kg)  Height: 5' 10"  (1.778 m)   Physical Exam HENT:     Head: Normocephalic and atraumatic.  Eyes:     Pupils: Pupils are equal, round, and reactive to light.  Neck:     Musculoskeletal: Normal range of motion.  Cardiovascular:     Rate and Rhythm: Normal rate and regular rhythm.     Heart sounds: Normal heart sounds.  Pulmonary:     Effort: Pulmonary effort is normal.     Breath sounds: Normal breath sounds.  Abdominal:     General: Bowel sounds are normal.     Palpations: Abdomen is soft.  Skin:    General: Skin is warm and dry.  Neurological:     Mental Status: He is alert and oriented to person, place, and time.      CMP Latest Ref Rng & Units 03/22/2018  Glucose 70 - 99 mg/dL -  BUN 8 - 23 mg/dL -  Creatinine 0.61 - 1.24 mg/dL -  Sodium 135 - 145 mmol/L -  Potassium 3.5 - 5.1 mmol/L 4.5  Chloride 98 - 111 mmol/L -  CO2 22 - 32 mmol/L -  Calcium 8.9 - 10.3 mg/dL -   CBC Latest Ref Rng & Units 04/05/2018  WBC 4.0 - 10.5 K/uL 18.2(H)  Hemoglobin 13.0 - 17.0 g/dL 15.1  Hematocrit 39.0 - 52.0 % 46.4  Platelets 150 - 400 K/uL 295    No images are attached to the encounter.  Ct Bone Marrow Biopsy & Aspiration  Result Date: 04/05/2018 CLINICAL DATA:  Leukocytosis of unknown etiology EXAM: CT GUIDED DEEP ILIAC BONE ASPIRATION AND CORE BIOPSY TECHNIQUE: Patient was placed supine on the CT gantry and limited axial scans through  the pelvis were obtained. Appropriate skin entry site was identified. Skin site was marked, prepped with chlorhexidine, draped in usual sterile fashion, and infiltrated locally with  1% lidocaine. Intravenous Fentanyl and Versed were administered as conscious sedation during continuous monitoring of the patient's level of consciousness and physiological / cardiorespiratory status by the radiology RN, with a total moderate sedation time of 14 minutes. Under CT fluoroscopic guidance an 11-gauge Cook trocar bone needle was advanced into the left iliac bone just lateral to the sacroiliac joint. Once needle tip position was confirmed, core and aspiration samples were obtained, submitted to pathology for approval. Post procedure scans show no hematoma or fracture. Patient tolerated procedure well. COMPLICATIONS: COMPLICATIONS none IMPRESSION: 1. Technically successful CT guided left iliac bone core and aspiration biopsy. Electronically Signed   By: Lucrezia Europe M.D.   On: 04/05/2018 10:01     Assessment and plan- Patient is a 62 y.o. male with leukocytosis mainly neutrophilia and monocytosis likely reactive  Patient is white count waxes and wanes anywhere from 13-23 and differential mainly shows neutrophilia.  All his peripheral blood work-up so far and a bone marrow biopsy as well as not pointed out to any primary myeloid/myeloproliferative cause.  This is likely reactive secondary to his underlying sinusitis and/or inflammatory bowel disease.  I will see him back in 6 months time with CBC with differential at [facility]   Visit Diagnosis 1. Leukocytosis, unspecified type      Dr. Randa Evens, MD, MPH [facility] at [facility] [phone] 04/15/2018 3:23 PM

## 2018-04-16 ENCOUNTER — Encounter (HOSPITAL_COMMUNITY): Payer: Self-pay | Admitting: Oncology

## 2018-04-20 LAB — MISC LABCORP TEST (SEND OUT): LABCORP TEST CODE: 451953

## 2018-04-27 ENCOUNTER — Emergency Department: Payer: Medicare PPO

## 2018-04-27 ENCOUNTER — Emergency Department
Admission: EM | Admit: 2018-04-27 | Discharge: 2018-04-27 | Disposition: A | Discharge status: Home / Self Care | Payer: Medicare PPO | Attending: Emergency Medicine | Admitting: Emergency Medicine

## 2018-04-27 ENCOUNTER — Other Ambulatory Visit: Payer: Self-pay

## 2018-04-27 DIAGNOSIS — I10 Essential (primary) hypertension: Secondary | ICD-10-CM | POA: Diagnosis not present

## 2018-04-27 DIAGNOSIS — R51 Headache: Secondary | ICD-10-CM | POA: Diagnosis not present

## 2018-04-27 DIAGNOSIS — R0981 Nasal congestion: Secondary | ICD-10-CM | POA: Insufficient documentation

## 2018-04-27 DIAGNOSIS — Z79899 Other long term (current) drug therapy: Secondary | ICD-10-CM | POA: Diagnosis not present

## 2018-04-27 DIAGNOSIS — R519 Headache, unspecified: Secondary | ICD-10-CM

## 2018-04-27 DIAGNOSIS — E860 Dehydration: Secondary | ICD-10-CM | POA: Diagnosis not present

## 2018-04-27 DIAGNOSIS — R112 Nausea with vomiting, unspecified: Secondary | ICD-10-CM | POA: Diagnosis not present

## 2018-04-27 DIAGNOSIS — F17210 Nicotine dependence, cigarettes, uncomplicated: Secondary | ICD-10-CM | POA: Insufficient documentation

## 2018-04-27 DIAGNOSIS — Z7901 Long term (current) use of anticoagulants: Secondary | ICD-10-CM | POA: Diagnosis not present

## 2018-04-27 LAB — COMPREHENSIVE METABOLIC PANEL
ALT: 25 U/L (ref 0–44)
AST: 22 U/L (ref 15–41)
Albumin: 3.5 g/dL (ref 3.5–5.0)
Alkaline Phosphatase: 68 U/L (ref 38–126)
Anion gap: 10 (ref 5–15)
BUN: 14 mg/dL (ref 8–23)
CO2: 24 mmol/L (ref 22–32)
Calcium: 9.8 mg/dL (ref 8.9–10.3)
Chloride: 102 mmol/L (ref 98–111)
Creatinine, Ser: 0.91 mg/dL (ref 0.61–1.24)
GFR calc Af Amer: >60 mL/min (ref >60)
GFR calc non Af Amer: >60 mL/min (ref >60)
Glucose, Bld: 109 mg/dL — ABNORMAL HIGH (ref 70–99)
Potassium: 4.6 mmol/L (ref 3.5–5.1)
SODIUM: 136 mmol/L (ref 135–145)
Total Bilirubin: 0.8 mg/dL (ref 0.3–1.2)
Total Protein: 7.5 g/dL (ref 6.5–8.1)

## 2018-04-27 LAB — CBC WITH DIFFERENTIAL/PLATELET
Abs Immature Granulocytes: 1.37 10*3/uL — ABNORMAL HIGH (ref 0.00–0.07)
Basophils Absolute: 0.2 10*3/uL — ABNORMAL HIGH (ref 0.0–0.1)
Basophils Relative: 1 %
Eosinophils Absolute: 0.2 10*3/uL (ref 0.0–0.5)
Eosinophils Relative: 1 %
HCT: 46.3 % (ref 39.0–52.0)
Hemoglobin: 15 g/dL (ref 13.0–17.0)
Immature Granulocytes: 7 %
LYMPHS PCT: 19 %
Lymphs Abs: 3.6 10*3/uL (ref 0.7–4.0)
MCH: 30.6 pg (ref 26.0–34.0)
MCHC: 32.4 g/dL (ref 30.0–36.0)
MCV: 94.5 fL (ref 80.0–100.0)
Monocytes Absolute: 1.6 10*3/uL — ABNORMAL HIGH (ref 0.1–1.0)
Monocytes Relative: 9 %
Neutro Abs: 11.7 10*3/uL — ABNORMAL HIGH (ref 1.7–7.7)
Neutrophils Relative %: 63 %
Platelets: 437 10*3/uL — ABNORMAL HIGH (ref 150–400)
RBC: 4.9 MIL/uL (ref 4.22–5.81)
RDW: 13.4 % (ref 11.5–15.5)
WBC: 18.5 10*3/uL — ABNORMAL HIGH (ref 4.0–10.5)
nRBC: 0 % (ref 0.0–0.2)

## 2018-04-27 LAB — INFLUENZA PANEL BY PCR (TYPE A & B)
INFLAPCR: NEGATIVE
Influenza B By PCR: NEGATIVE

## 2018-04-27 LAB — PATHOLOGIST SMEAR REVIEW

## 2018-04-27 MED ORDER — ONDANSETRON 4 MG PO TBDP: 4.0000 mg | ORAL_TABLET | Freq: Three times a day (TID) | ORAL | 0 refills | Status: AC | PRN

## 2018-04-27 MED ORDER — SODIUM CHLORIDE 0.9 % IV BOLUS
1000.0000 mL | Freq: Once | INTRAVENOUS | Status: AC
  Administered 2018-04-27: 1000 mL via INTRAVENOUS

## 2018-04-27 MED ORDER — KETOROLAC TROMETHAMINE 30 MG/ML IJ SOLN
15.0000 mg | Freq: Once | INTRAMUSCULAR | Status: AC
  Administered 2018-04-27: 15 mg via INTRAVENOUS
  Filled 2018-04-27: qty 1

## 2018-04-27 MED ORDER — DIPHENHYDRAMINE HCL 50 MG/ML IJ SOLN
25.0000 mg | Freq: Once | INTRAMUSCULAR | Status: AC
  Administered 2018-04-27: 25 mg via INTRAVENOUS
  Filled 2018-04-27: qty 1

## 2018-04-27 MED ORDER — OXYMETAZOLINE HCL 0.05 % NA SOLN
1.0000 | Freq: Once | NASAL | Status: AC
  Administered 2018-04-27: 1 via NASAL
  Filled 2018-04-27: qty 15

## 2018-04-27 MED ORDER — BUTALBITAL-APAP-CAFFEINE 50-325-40 MG PO TABS: 1.0000 | ORAL_TABLET | Freq: Four times a day (QID) | ORAL | 0 refills | Status: AC | PRN

## 2018-04-27 MED ORDER — PROCHLORPERAZINE EDISYLATE 10 MG/2ML IJ SOLN
10.0000 mg | Freq: Once | INTRAMUSCULAR | Status: AC
  Administered 2018-04-27: 10 mg via INTRAVENOUS
  Filled 2018-04-27: qty 2

## 2018-04-27 NOTE — Discharge Instructions (Signed)
Please continue using your sinus rinses as you have and use your pain and nausea medication as needed for severe symptoms.  Follow-up with your primary care physician this coming week for recheck and return to the emergency department for any concerns.  It was a pleasure to take care of you today, and thank you for coming to our emergency department.  If you have any questions or concerns before leaving please ask the nurse to grab me and I'm more than happy to go through your aftercare instructions again.  If you were prescribed any opioid pain medication today such as Norco, Vicodin, Percocet, morphine, hydrocodone, or oxycodone please make sure you do not drive when you are taking this medication as it can alter your ability to drive safely.  If you have any concerns once you are home that you are not improving or are in fact getting worse before you can make it to your follow-up appointment, please do not hesitate to call [phone] and come back for further evaluation.  Darel Hong, MD  Results for orders placed or performed during the hospital encounter of 04/27/18  Comprehensive metabolic panel  Result Value Ref Range   Sodium 136 135 - 145 mmol/L   Potassium 4.6 3.5 - 5.1 mmol/L   Chloride 102 98 - 111 mmol/L   CO2 24 22 - 32 mmol/L   Glucose, Bld 109 (H) 70 - 99 mg/dL   BUN 14 8 - 23 mg/dL   Creatinine, Ser 0.91 0.61 - 1.24 mg/dL   Calcium 9.8 8.9 - 10.3 mg/dL   Total Protein 7.5 6.5 - 8.1 g/dL   Albumin 3.5 3.5 - 5.0 g/dL   AST 22 15 - 41 U/L   ALT 25 0 - 44 U/L   Alkaline Phosphatase 68 38 - 126 U/L   Total Bilirubin 0.8 0.3 - 1.2 mg/dL   GFR calc non Af Amer >60 >60 mL/min   GFR calc Af Amer >60 >60 mL/min   Anion gap 10 5 - 15  CBC with Differential  Result Value Ref Range   WBC 18.5 (H) 4.0 - 10.5 K/uL   RBC 4.90 4.22 - 5.81 MIL/uL   Hemoglobin 15.0 13.0 - 17.0 g/dL   HCT 46.3 39.0 - 52.0 %   MCV 94.5 80.0 - 100.0 fL   MCH 30.6 26.0 - 34.0 pg   MCHC 32.4 30.0 - 36.0  g/dL   RDW 13.4 11.5 - 15.5 %   Platelets 437 (H) 150 - 400 K/uL   nRBC 0.0 0.0 - 0.2 %   Neutrophils Relative % PENDING %   Neutro Abs PENDING 1.7 - 7.7 K/uL   Band Neutrophils PENDING %   Lymphocytes Relative PENDING %   Lymphs Abs PENDING 0.7 - 4.0 K/uL   Monocytes Relative PENDING %   Monocytes Absolute PENDING 0.1 - 1.0 K/uL   Eosinophils Relative PENDING %   Eosinophils Absolute PENDING 0.0 - 0.5 K/uL   Basophils Relative PENDING %   Basophils Absolute PENDING 0.0 - 0.1 K/uL   WBC Morphology PENDING    RBC Morphology PENDING    Smear Review PENDING    Other PENDING %   nRBC PENDING 0 /100 WBC   Metamyelocytes Relative PENDING %   Myelocytes PENDING %   Promyelocytes Relative PENDING %   Blasts PENDING %   Ct Head Wo Contrast  Result Date: 04/27/2018 CLINICAL DATA:  Acute onset of headache and vomiting. Loss of appetite. EXAM: CT HEAD WITHOUT CONTRAST TECHNIQUE:  Contiguous axial images were obtained from the base of the skull through the vertex without intravenous contrast. COMPARISON:  CT of the maxillofacial structures performed 06/30/2016 FINDINGS: Brain: No evidence of acute infarction, hemorrhage, hydrocephalus, extra-axial collection or mass lesion / mass effect. Prominence of the sulci suggests mild cortical volume loss. Cerebellar atrophy is noted. The brainstem and fourth ventricle are within normal limits. The basal ganglia are unremarkable in appearance. The cerebral hemispheres demonstrate grossly normal gray-white differentiation. No mass effect or midline shift is seen. Vascular: No hyperdense vessel or unexpected calcification. Skull: There is no evidence of fracture; visualized osseous structures are unremarkable in appearance. Sinuses/Orbits: The visualized portions of the orbits are within normal limits. The paranasal sinuses and mastoid air cells are well-aerated. Other: No significant soft tissue abnormalities are seen. IMPRESSION: 1. No acute intracranial  pathology seen on CT. 2. Mild cortical volume loss noted. Electronically Signed   By: Garald Balding M.D.   On: 04/27/2018 06:22   Dg Chest Port 1 View  Result Date: 04/27/2018 CLINICAL DATA:  Headache and vomiting. Headache and vomiting. Smoker. History of pulmonary emboli. EXAM: PORTABLE CHEST 1 VIEW COMPARISON:  Chest CT October 14, 2017 FINDINGS: Cardiomediastinal silhouette is normal. No pleural effusions or focal consolidations. Mild chronic interstitial changes increased lung volumes. Trachea projects midline and there is no pneumothorax. Soft tissue planes and included osseous structures are non-suspicious. IMPRESSION: 1. No acute cardiopulmonary process. 2.  Emphysema (ICD10-J43.9). Electronically Signed   By: Elon Alas M.D.   On: 04/27/2018 06:16   Ct Bone Marrow Biopsy & Aspiration  Result Date: 04/05/2018 CLINICAL DATA:  Leukocytosis of unknown etiology EXAM: CT GUIDED DEEP ILIAC BONE ASPIRATION AND CORE BIOPSY TECHNIQUE: Patient was placed supine on the CT gantry and limited axial scans through the pelvis were obtained. Appropriate skin entry site was identified. Skin site was marked, prepped with chlorhexidine, draped in usual sterile fashion, and infiltrated locally with 1% lidocaine. Intravenous Fentanyl and Versed were administered as conscious sedation during continuous monitoring of the patient's level of consciousness and physiological / cardiorespiratory status by the radiology RN, with a total moderate sedation time of 14 minutes. Under CT fluoroscopic guidance an 11-gauge Cook trocar bone needle was advanced into the left iliac bone just lateral to the sacroiliac joint. Once needle tip position was confirmed, core and aspiration samples were obtained, submitted to pathology for approval. Post procedure scans show no hematoma or fracture. Patient tolerated procedure well. COMPLICATIONS: COMPLICATIONS none IMPRESSION: 1. Technically successful CT guided left iliac bone core and  aspiration biopsy. Electronically Signed   By: Lucrezia Europe M.D.   On: 04/05/2018 10:01

## 2018-04-27 NOTE — ED Triage Notes (Addendum)
Pt arrives to ED via [facility] from home with c/o headache x2 days and emesis x1 day. Pt reports being d/x'd with a nasal "staph infection" and r/x'd antibiotics for same which he finished a day or two ago. Pt reports having no appetite with decreased PO intake x 2 days. Pt denies visual changes, tinnitus, facial droop, slurred speech, or unilateral weakness.Pt MAEW, bilateral grips are strong and equal. Pt is A&O, in NAD; RR even, regular, and unlabored. Dr Mable Paris at bedside upon pt's arrival to ED.

## 2018-04-27 NOTE — ED Provider Notes (Signed)
[facility] Emergency Department Provider Note  ____________________________________________   None    (approximate)  I have reviewed the triage vital signs and the nursing notes.   HISTORY  Chief Complaint Headache and Emesis   HPI Brent Diaz is a 62 y.o. male who comes to the emergency department via EMS with 2 days of gradual onset not maximal onset bifrontal throbbing headache similar to previous headaches that he has had in the past.  He said that he was recently diagnosed with a "staph infection" in his sinuses and completed a course of Bactrim for the same.  He reports persistent sinus congestion and fullness.  He has not had a headache this severe in several years which concerned him.  He has had decreased appetite and feels quite dehydrated.  No double vision or blurred vision.  No chest pain or shortness of breath.  No abdominal pain.  Some nausea but no vomiting.  No numbness or weakness.  He denies rash.  He does take Eliquis for remote pulmonary embolism.    Past Medical History:  Diagnosis Date  . Cancer ([place])    skin  . Crohn's disease ([place])   . GERD (gastroesophageal reflux disease)   . Hypertension   . Neutrophilia   . Shingles    resolved  . Wears contact lenses    Left eye    Patient Active Problem List   Diagnosis Date Noted  . Pulmonary embolism ([place]) 10/14/2017  . Eye swelling, right 06/30/2016    Past Surgical History:  Procedure Laterality Date  . ABDOMINAL SURGERY    . BOWEL RESECTION    . HYDROCELE EXCISION / REPAIR    . IMAGE GUIDED SINUS SURGERY N/A 05/21/2017   Procedure: IMAGE GUIDED SINUS SURGERY;  Surgeon: Margaretha Sheffield, MD;  Location: [place];  Service: ENT;  Laterality: N/A;  need stryker disk  . MAXILLARY ANTROSTOMY Right 05/21/2017   Procedure: MAXILLARY ANTROSTOMY;  Surgeon: Margaretha Sheffield, MD;  Location: [place];  Service: ENT;  Laterality: Right;  . SEPTOPLASTY N/A 05/21/2017   Procedure: SEPTOPLASTY;  Surgeon: Margaretha Sheffield, MD;  Location: [place];  Service: ENT;  Laterality: N/A;    Prior to Admission medications   Medication Sig Start Date End Date Taking? Authorizing Provider  apixaban (ELIQUIS) 5 MG TABS tablet Take 1 tablet (5 mg total) by mouth 2 (two) times daily. Start 6/19 10/21/17   Bettey Costa, MD  B Complex-Folic Acid (B COMPLEX-VITAMIN B12 PO) Take 1,000 mcg by mouth daily.     [provider]  budesonide (ENTOCORT EC) 3 MG 24 hr capsule Take 6 mg by mouth daily.     [provider]  butalbital-acetaminophen-caffeine (FIORICET, ESGIC) 50-325-40 MG tablet Take 1-2 tablets by mouth every 6 (six) hours as needed. 04/27/18 04/27/19  Darel Hong, MD  cycloSPORINE (RESTASIS) 0.05 % ophthalmic emulsion Place 1 drop into the right eye 2 (two) times daily.    [provider]  folic acid (FOLVITE) 1 MG tablet Take 1 mg by mouth daily. 06/21/16   [provider]  gentamicin ointment (GARAMYCIN) 0.1 % Apply 1 application as needed topically.    [provider]  lisinopril-hydrochlorothiazide (PRINZIDE,ZESTORETIC) 20-12.5 MG tablet Take 1 tablet by mouth daily.     [provider]  loperamide (IMODIUM) 2 MG capsule Take 2 mg by mouth as needed for diarrhea or loose stools.    [provider]  Melatonin 5 MG TABS Take 10  mg by mouth at bedtime as needed.    [provider]  montelukast (SINGULAIR) 10 MG tablet Take 10 mg by mouth every morning.     [provider]  ondansetron (ZOFRAN ODT) 4 MG disintegrating tablet Take 1 tablet (4 mg total) by mouth every 8 (eight) hours as needed for nausea or vomiting. 04/27/18   Darel Hong, MD  pantoprazole (PROTONIX) 40 MG tablet Take 40 mg by mouth 2 (two) times daily. 04/14/16   [provider]  potassium chloride SA (K-DUR,KLOR-CON) 20 MEQ tablet Take 1 tablet by mouth daily.  10/08/17 04/12/18  [provider]    Probiotic Product (ALIGN) 4 MG CAPS Take 4 mg daily by mouth.    [provider]  vitamin B-12 (CYANOCOBALAMIN) 1000 MCG tablet Take 1,000 mcg by mouth daily.    [provider]  Vitamin D, Ergocalciferol, (DRISDOL) 50000 units CAPS capsule Take 50,000 Units by mouth every 7 (seven) days.    [provider]  zolpidem (AMBIEN) 10 MG tablet Take 10 mg by mouth at bedtime as needed for sleep.    [provider]    Allergies Amlodipine; Aspirin; and Flagyl [metronidazole]  Family History  Problem Relation Age of Onset  . Aneurysm Father 81  . Dementia Mother 40    Social History Social History   Tobacco Use  . Smoking status: Current Every Day Smoker    Packs/day: 0.50    Years: 47.00    Pack years: 23.50    Types: Cigarettes  . Smokeless tobacco: Never Used  . Tobacco comment: since age 67  Substance Use Topics  . Alcohol use: No  . Drug use: Never    Review of Systems Constitutional: No fever/chills Eyes: No visual changes. ENT: Positive for sinus pressure Cardiovascular: Denies chest pain. Respiratory: Denies shortness of breath. Gastrointestinal: No abdominal pain.  Positive for nausea, no vomiting.  No diarrhea.  No constipation. Genitourinary: Negative for dysuria. Musculoskeletal: Negative for back pain. Skin: Negative for rash. Neurological: Positive for headache   ____________________________________________   PHYSICAL EXAM:  VITAL SIGNS: ED Triage Vitals  Enc Vitals Group     BP --      Pulse --      Resp --      Temp --      Temp src --      SpO2 --      Weight 04/27/18 0547 168 lb (76.2 kg)     Height 04/27/18 0547 5' 10"  (1.778 m)     Head Circumference --      Peak Flow --      Pain Score 04/27/18 0546 7     Pain Loc --      Pain Edu? --      Excl. in [place]? --     Constitutional: Alert and oriented x4 appears obviously uncomfortable holding his head although nontoxic no diaphoresis and speaks in full  clear sentences Eyes: PERRL EOMI. midrange and brisk.  No proptosis Head: Atraumatic.  Some frontal sinus tenderness Nose: No congestion/rhinnorhea. Mouth/Throat: No trismus Neck: No stridor.  No meningismus Cardiovascular: Normal rate, regular rhythm. Grossly normal heart sounds.  Good peripheral circulation. Respiratory: Normal respiratory effort.  No retractions. Lungs CTAB and moving good air Gastrointestinal: Soft nontender Musculoskeletal: No lower extremity edema   Neurologic:  Normal speech and language. No gross focal neurologic deficits are appreciated. Skin:  Skin is warm, dry and intact. No rash noted. Psychiatric: Mood and affect are normal.  Speech and behavior are normal.    ____________________________________________   DIFFERENTIAL includes but not limited to  Sinusitis, tension headache, Mucor, viral syndrome ____________________________________________   LABS (all labs ordered are listed, but only abnormal results are displayed)  Labs Reviewed  COMPREHENSIVE METABOLIC PANEL - Abnormal; Notable for the following components:      Result Value   Glucose, Bld 109 (*)    All other components within normal limits  CBC WITH DIFFERENTIAL/PLATELET - Abnormal; Notable for the following components:   WBC 18.5 (*)    Platelets 437 (*)    Neutro Abs 11.7 (*)    Monocytes Absolute 1.6 (*)    Basophils Absolute 0.2 (*)    Abs Immature Granulocytes 1.37 (*)    All other components within normal limits  INFLUENZA PANEL BY PCR (TYPE A & B)  PATHOLOGIST SMEAR REVIEW    Lab work reviewed by me shows elevated white count which is nonspecific and could be secondary to pain.  No evidence of diabetes mellitus __________________________________________  EKG   ____________________________________________  RADIOLOGY  CT scan of the head reviewed by me with no acute disease noted ____________________________________________   PROCEDURES  Procedure(s) performed:  no  Procedures  Critical Care performed: no  ____________________________________________   INITIAL IMPRESSION / ASSESSMENT AND PLAN / ED COURSE  Pertinent labs & imaging results that were available during my care of the patient were reviewed by me and considered in my medical decision making (see chart for details).   As part of my medical decision making, I reviewed the following data within the [place] History obtained from family if available, nursing notes, old chart and ekg, as well as notes from prior ED visits.  The patient comes to the emergency department with 2 days of bifrontal throbbing headache in the setting of recent sinus congestion and sinusitis.  He does appear quite uncomfortable although fortunately has an unremarkable neuro exam.  Regarding his sinusitis he has no history of diabetes making Mucor essentially impossible.  I will begin with IV fluids, Compazine, Benadryl, Toradol, and Afrin for his headache and congestion and CT his head to evaluate for any complications of his sinusitis.  Following treatment his pain is down to 1 out of 10 and he feels significantly improved.  Fortunately his lab work and CT scan are reassuring.  I will prescribe him a short course of Zofran and Fioricet for home and refer him back to primary care.  Strict return precautions have been given.      ____________________________________________   FINAL CLINICAL IMPRESSION(S) / ED DIAGNOSES  Final diagnoses:  Nonintractable headache, unspecified chronicity pattern, unspecified headache type  Sinus congestion  Nausea and vomiting, intractability of vomiting not specified, unspecified vomiting type  Dehydration      NEW MEDICATIONS STARTED DURING THIS VISIT:  Discharge Medication List as of 04/27/2018  7:09 AM    START taking these medications   Details  butalbital-acetaminophen-caffeine (FIORICET, ESGIC) 50-325-40 MG tablet Take 1-2 tablets by mouth every 6  (six) hours as needed., Starting Tue 04/27/2018, Until Wed 04/27/2019, Print    ondansetron (ZOFRAN ODT) 4 MG disintegrating tablet Take 1 tablet (4 mg total) by mouth every 8 (eight) hours as needed for nausea or vomiting., Starting Tue 04/27/2018, Print         Note:  This document was prepared using Dragon voice recognition software and may include unintentional dictation errors.    Darel Hong, MD 05/01/18 0111

## 2018-09-13 ENCOUNTER — Ambulatory Visit: Admit: 2018-09-13 | Payer: Medicare PPO | Admitting: Ophthalmology

## 2018-09-13 SURGERY — PHACOEMULSIFICATION, CATARACT, WITH IOL INSERTION
Anesthesia: Topical | Laterality: Left

## 2018-10-13 ENCOUNTER — Other Ambulatory Visit: Payer: Self-pay

## 2018-10-13 NOTE — Progress Notes (Signed)
[facility]  [address], [address] [place], [place] [address] Phone: [phone]  Fax: [phone]   Clinic Day:  10/14/2018  Referring physician: Lynnell Jude, MD  Chief Complaint: Brent Diaz is a 63 y.o. male with leukocytosis who is seen for new patient assessment.  HPI: The patient was initially diagnosed with leukocytosis in 2015. He presented with a WBC of 13,400. Platelet count was normal. He had some monocytosis. Differential showed mainly neutrophilia and basophilia. Leukocytosis was thought to be reactive. He has a history of Crohn's disease and prior bowel surgeries. He had a history of sinusitis.   He was initially seen in the hematology clinic on 11/09/2017 by Dr. Randa Evens.  He was referred by Dr. Vira Agar, gastroenterology.  Work-up revealed a WBC 20,100, ANC 17,100, hemoglobin 15.7, hematocrit 47.1, and platelets 251,000. Flow cytometry revealed neutrophilia with slightly left-shifted maturation and monocytosis with rare phenotypic aberrancy. BCR-ABL was negative.  Peripheral smear was unremarkable. CRP was 4.77 (elevated). JAK2 was negative.   He underwent a colonoscopy on 02/23/2018. Per patient, results were unremarkable.   He underwent a bone marrow biopsy due to his WBC trending upwards on 04/05/2018. Pathology revealed normocellular bone marrow for age with trilineage hematopoiesis. Peripheral blood showed leukocytosis with left shifted neutrophilia and monocytosis. Cytogenetics was normal (46, XY).   WBC has been followed: 13,400 on 02/28/2014, 15,200 on 08/29/2014, 19,800 on 06/30/2016, 12,900 on 07/01/2016, 25,700 on 10/01/2017, 26,500 on 10/08/2017, 19,000 on 10/14/2017, 17,000 on 10/15/2017, 21,800 on 11/03/2017, 20,100 on 11/09/2017, 19,700 on 01/25/2018, 23,400 on 03/22/2018, 18,200 on 04/05/2018, and 18,500 on 04/27/2018.  The patient was last seen in the hematology clinic on 04/12/2018 by Dr. Janese Banks. At that time, he continued to  have congestion and recurrent sinusitis, for which he sees ENT. He denied any fevers, diarrhea, or blood in his stools. WBC was 18,200.   During the interim, he "feels good." He notes he is not as energetic as he was 6 months ago. He denies any infections or fevers. He notes a Crohn's flare up for which he is on budesonide. He denies any blood in his stool or diarrhea. He has 2-3 BMs daily that are currently loose. He denies any current sinus issues.  He has cataracts which have altered his vision, and is scheduled for surgery on 10/18/2018. He notes all his bottom teeth need to come out and he has had crowns put on all his top teeth.  He has a history of squamous cell carcinoma. He has had 32 removal surgeries in 4 years. He has been unable to see his dermatologist due to the COVID-19 pandemic. He had shingles in 2018. He had a pulmonary embolism on 10/14/2017 of unknown etiology and is on life-long anti-coagulation with Eliquis.    Past Medical History:  Diagnosis Date  . Cancer ([place])    skin  . Crohn's disease ([place])   . GERD (gastroesophageal reflux disease)   . Hypertension   . MRSA (methicillin resistant staph aureus) culture positive    cultured during sinus surgery 05/2017  . Neutrophilia   . Pulmonary embolism ([place]) 10/2017  . Shingles    resolved  . Wears contact lenses    Left eye    Past Surgical History:  Procedure Laterality Date  . ABDOMINAL SURGERY    . BOWEL RESECTION     x 3  . HYDROCELE EXCISION / REPAIR    . IMAGE GUIDED SINUS SURGERY N/A 05/21/2017   Procedure: IMAGE  GUIDED SINUS SURGERY;  Surgeon: Margaretha Sheffield, MD;  Location: [place];  Service: ENT;  Laterality: N/A;  need stryker disk  . MAXILLARY ANTROSTOMY Right 05/21/2017   Procedure: MAXILLARY ANTROSTOMY;  Surgeon: Margaretha Sheffield, MD;  Location: [facility];  Service: ENT;  Laterality: Right;  . SEPTOPLASTY N/A 05/21/2017   Procedure: SEPTOPLASTY;  Surgeon: Margaretha Sheffield, MD;  Location:  [place];  Service: ENT;  Laterality: N/A;    Family History  Problem Relation Age of [place]  . Aneurysm Father 22  . Dementia Mother 83    Social History:  reports that he has been smoking cigarettes. He has a 23.50 pack-year smoking history. He has never used smokeless tobacco. He reports that he does not drink alcohol or use drugs. He smokes 10-15 cigarettes daily. He has smoked since he was 63yo and is not motivated to quit. He denies any known exposure to radiation or toxins. 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 He lives in [place].  The patient is alone today.  Allergies:  Allergies  Allergen Reactions  . Amlodipine Swelling    feet  . Aspirin Other (See Comments)    Peptic ulcers  . Flagyl [Metronidazole]     Gives neuropathy if taken too long     Current Medications: Current Outpatient Medications  Medication Sig Dispense Refill  . apixaban (ELIQUIS) 5 MG TABS tablet Take 1 tablet (5 mg total) by mouth 2 (two) times daily. Start 6/19 60 tablet 0  . budesonide (ENTOCORT EC) 3 MG 24 hr capsule Take 6 mg by mouth daily.     . cycloSPORINE (RESTASIS) 0.05 % ophthalmic emulsion Place 1 drop into the right eye 2 (two) times daily.    . folic acid (FOLVITE) 1 MG tablet Take 1 mg by mouth daily.  6  . gentamicin ointment (GARAMYCIN) 0.1 % Apply 1 application as needed topically.    Marland Kitchen lisinopril-hydrochlorothiazide (PRINZIDE,ZESTORETIC) 20-12.5 MG tablet Take 1 tablet by mouth daily. 5 mg daily    . Melatonin 5 MG TABS Take 10 mg by mouth at bedtime as needed.    . montelukast (SINGULAIR) 10 MG tablet Take 10 mg by mouth every morning.     . pantoprazole (PROTONIX) 40 MG tablet Take 40 mg by mouth 2 (two) times daily.  6  . potassium chloride SA (K-DUR,KLOR-CON) 20 MEQ tablet Take 1 tablet by mouth daily.     . vitamin B-12 (CYANOCOBALAMIN) 1000 MCG tablet Take 1,000 mcg by mouth daily.    . Vitamin D, Ergocalciferol, (DRISDOL) 50000 units  CAPS capsule Take 50,000 Units by mouth every 7 (seven) days.    . B Complex-Folic Acid (B COMPLEX-VITAMIN B12 PO) Take 1,000 mcg by mouth daily.     . butalbital-acetaminophen-caffeine (FIORICET, ESGIC) 50-325-40 MG tablet Take 1-2 tablets by mouth every 6 (six) hours as needed. (Patient not taking: Reported on 10/11/2018) 20 tablet 0  . loperamide (IMODIUM) 2 MG capsule Take 2 mg by mouth as needed for diarrhea or loose stools.    . ondansetron (ZOFRAN ODT) 4 MG disintegrating tablet Take 1 tablet (4 mg total) by mouth every 8 (eight) hours as needed for nausea or vomiting. (Patient not taking: Reported on 10/11/2018) 20 tablet 0  . Probiotic Product (ALIGN) 4 MG CAPS Take 4 mg daily by mouth.    . zolpidem (AMBIEN) 10 MG tablet Take 10 mg by mouth at bedtime as needed for sleep.     No  current facility-administered medications for this visit.     Review of Systems  Constitutional: Negative.  Negative for chills, diaphoresis, fever, malaise/fatigue and weight loss (stable).       Feels "good, but not as energetic".  HENT: Negative for congestion, ear pain, hearing loss, nosebleeds, sinus pain and sore throat.   Eyes: Positive for blurred vision (cataracts). Negative for double vision and pain.  Respiratory: Negative.  Negative for cough, shortness of breath and wheezing.   Cardiovascular: Negative for chest pain, palpitations, orthopnea and leg swelling.  Gastrointestinal: Positive for abdominal pain (Crohn's flare up) and diarrhea (loose stools). Negative for blood in stool, constipation, heartburn, melena, nausea and vomiting.       2-3 BMs daily. Crohn's (3 previous surgeries).  Genitourinary: Negative.  Negative for dysuria, frequency, hematuria and urgency.  Musculoskeletal: Negative.  Negative for back pain, joint pain and myalgias.  Skin: Negative.  Negative for rash.  Neurological: Negative.  Negative for dizziness, tingling, sensory change, focal weakness, weakness and headaches.   Endo/Heme/Allergies: Does not bruise/bleed easily.  Psychiatric/Behavioral: Negative.  Negative for depression and memory loss. The patient is not nervous/anxious and does not have insomnia.   All other systems reviewed and are negative.  Performance status (ECOG): 1  Vitals: Blood pressure 131/80, pulse 89, temperature 98.5 F (36.9 C), temperature source Tympanic, resp. rate 18, height 5' 10"  (1.778 m), weight 175 lb 13.1 oz (79.8 kg), SpO2 98 %.   Physical Exam  Constitutional: He is oriented to person, place, and time. He appears well-developed and well-nourished. No distress.  HENT:  Head: Normocephalic and atraumatic.  Mouth/Throat: Oropharynx is clear and moist. No oropharyngeal exudate.  Wearing a cap and mask.  Gray hair and [employer].  Eyes: Pupils are equal, round, and reactive to light. Conjunctivae and EOM are normal. No scleral icterus.  Glasses.   Neck: Normal range of motion. Neck supple. No JVD present.  Cardiovascular: Normal rate, regular rhythm and normal heart sounds.  No murmur heard. Pulmonary/Chest: Effort normal and breath sounds normal. No respiratory distress. He has no wheezes. He has no rales.  Abdominal: Soft. Bowel sounds are normal. He exhibits no distension and no mass. There is no abdominal tenderness. There is no rebound and no guarding.  Musculoskeletal: Normal range of motion.        General: No edema.  Lymphadenopathy:    He has no cervical adenopathy.    He has no axillary adenopathy.       Right: No supraclavicular adenopathy present.       Left: No supraclavicular adenopathy present.  Neurological: He is alert and oriented to person, place, and time.  Skin: Skin is warm and dry. No rash noted. He is not diaphoretic. No erythema.  Psychiatric: He has a normal mood and affect. His behavior is normal. Judgment and thought content normal.  Nursing note and vitals reviewed.   Appointment on 10/14/2018  Component Date Value Ref Range Status  .  Sed Rate 10/14/2018 9  0 - 20 mm/hr Final   Performed at [facility], [address], [place], [place] [address]  . LDH 10/14/2018 128  98 - 192 U/L Final   Performed at [facility], [address], [place], [place] [address]  . Sodium 10/14/2018 135  135 - 145 mmol/L Final  . Potassium 10/14/2018 3.5  3.5 - 5.1 mmol/L Final  . Chloride 10/14/2018 104  98 - 111 mmol/L Final  . CO2 10/14/2018 21* 22 -  32 mmol/L Final  . Glucose, Bld 10/14/2018 125* 70 - 99 mg/dL Final  . BUN 10/14/2018 6* 8 - 23 mg/dL Final  . Creatinine, Ser 10/14/2018 0.84  0.61 - 1.24 mg/dL Final  . Calcium 10/14/2018 8.7* 8.9 - 10.3 mg/dL Final  . GFR calc non Af Amer 10/14/2018 >60  >60 mL/min Final  . GFR calc Af Amer 10/14/2018 >60  >60 mL/min Final  . Anion gap 10/14/2018 10  5 - 15 Final   Performed at [facility], [address], [place], [place] [address]  . WBC 10/14/2018 20.0* 4.0 - 10.5 K/uL Final  . RBC 10/14/2018 4.69  4.22 - 5.81 MIL/uL Final  . Hemoglobin 10/14/2018 14.5  13.0 - 17.0 g/dL Final  . HCT 10/14/2018 44.5  39.0 - 52.0 % Final  . MCV 10/14/2018 94.9  80.0 - 100.0 fL Final  . MCH 10/14/2018 30.9  26.0 - 34.0 pg Final  . MCHC 10/14/2018 32.6  30.0 - 36.0 g/dL Final  . RDW 10/14/2018 13.6  11.5 - 15.5 % Final  . Platelets 10/14/2018 346  150 - 400 K/uL Final  . nRBC 10/14/2018 0.0  0.0 - 0.2 % Final  . Neutrophils Relative % 10/14/2018 76  % Final  . Neutro Abs 10/14/2018 15.1* 1.7 - 7.7 K/uL Final  . Lymphocytes Relative 10/14/2018 13  % Final  . Lymphs Abs 10/14/2018 2.7  0.7 - 4.0 K/uL Final  . Monocytes Relative 10/14/2018 8  % Final  . Monocytes Absolute 10/14/2018 1.6* 0.1 - 1.0 K/uL Final  . Eosinophils Relative 10/14/2018 0  % Final  . Eosinophils Absolute 10/14/2018 0.1  0.0 - 0.5 K/uL Final  . Basophils Relative 10/14/2018 1  % Final  . Basophils Absolute 10/14/2018 0.1  0.0 - 0.1 K/uL Final  . Immature Granulocytes 10/14/2018 2  % Final   . Abs Immature Granulocytes 10/14/2018 0.48* 0.00 - 0.07 K/uL Final   Performed at [facility], [address] [place], [place] [address]    Assessment:  Brent Diaz is a 63 y.o. 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  WBC has ranged between 13,400 - 25,700 without trend. He has Crohn's disease and a history of sinusitis.  He smokes.  Work-up on 11/09/2017 revealed a WBC 20,100, ANC 17,100, hemoglobin 15.7, hematocrit 47.1, and platelets 251,000.  Normal studies included:  BCR-ABL and JAK2 V617F.  Flow cytometry revealed neutrophilia with slightly left-shifted maturation and monocytosis with rare phenotypic aberrancy.  Peripheral smear was unremarkable. CRP was 4.77 (elevated).   Bone marrow biopsy on 04/05/2018 revealed a normocellular bone marrow for age with trilineage hematopoiesis. Peripheral blood showed leukocytosis with left shifted neutrophilia and monocytosis. Cytogenetics was normal (46, XY).   Monocyte count has ranged between 1400 - 3200 since 06/30/2016.  Monocytes have been 7-16% of WBCs.  He has a history of pulmonary embolism in 10/2017.  He is on Eliquis.  Symptomatically, he feels good.  He denies any B symptoms.  He has had a recent Crohn's flare and is on budesonide.  Exam reveals no adenopathy or hepatosplenomegaly.    Plan: 1.   Labs today: CBC w/ diff, 2.   Leukocytosis  Review entire medical history, diagnoses and management of leukocytosis.  Review bone marrow and work-up.  Etiology felt reactive secondary to infections, Crohn's disease, and smoking. 3.   Monocytosis  Patient has had a monocytosis ranging between 1400 - 3200 (7-16% of WBCs).  CMML concern, but  not consistently > 10% of WBC.  Etiology likely secondary to inflammatory bowl disease.  Differential also includes infections, CML/AML (r/o by marrow and negative BCR-ABL), Hodgkin's, and steroids.  3.   RTC in 6 months for MD assessment and labs (CBC with diff, sed  rate).  I discussed the assessment and treatment plan with the patient.  The patient was provided an opportunity to ask questions and all were answered.  The patient agreed with the plan and demonstrated an understanding of the instructions.  The patient was advised to call back if the symptoms worsen or if the condition fails to improve as anticipated.  I provided 20 minutes of face-to-face time during this this encounter and > 50% was spent counseling as documented under my assessment and plan.    Lequita Asal, MD, PhD    10/14/2018, 3:46 PM  I, Molly Dorshimer, am acting as Education administrator for [employer]. Mike Gip, MD, PhD.  I, Melissa C. Mike Gip, MD, have reviewed the above documentation for accuracy and completeness, and I agree with the above.

## 2018-10-14 ENCOUNTER — Other Ambulatory Visit: Payer: Medicare PPO

## 2018-10-14 ENCOUNTER — Encounter: Payer: Self-pay | Admitting: Hematology and Oncology

## 2018-10-14 ENCOUNTER — Ambulatory Visit: Payer: Medicare PPO | Admitting: Oncology

## 2018-10-14 ENCOUNTER — Other Ambulatory Visit: Payer: Self-pay | Admitting: Hematology and Oncology

## 2018-10-14 ENCOUNTER — Encounter
Admission: RE | Admit: 2018-10-14 | Discharge: 2018-10-14 | Disposition: A | Discharge status: Home / Self Care | Payer: Medicare PPO | Source: Ambulatory Visit | Attending: Ophthalmology | Admitting: Ophthalmology

## 2018-10-14 ENCOUNTER — Other Ambulatory Visit: Payer: Self-pay

## 2018-10-14 ENCOUNTER — Inpatient Hospital Stay: Payer: Medicare PPO | Attending: Hematology and Oncology

## 2018-10-14 ENCOUNTER — Inpatient Hospital Stay (HOSPITAL_BASED_OUTPATIENT_CLINIC_OR_DEPARTMENT_OTHER): Payer: Medicare PPO | Admitting: Hematology and Oncology

## 2018-10-14 VITALS — BP 131/80 | HR 89 | Temp 98.5°F | Resp 18 | Ht 70.0 in | Wt 175.8 lb

## 2018-10-14 DIAGNOSIS — K5090 Crohn's disease, unspecified, without complications: Secondary | ICD-10-CM

## 2018-10-14 DIAGNOSIS — J329 Chronic sinusitis, unspecified: Secondary | ICD-10-CM

## 2018-10-14 DIAGNOSIS — D72829 Elevated white blood cell count, unspecified: Secondary | ICD-10-CM

## 2018-10-14 DIAGNOSIS — H269 Unspecified cataract: Secondary | ICD-10-CM

## 2018-10-14 DIAGNOSIS — R197 Diarrhea, unspecified: Secondary | ICD-10-CM | POA: Insufficient documentation

## 2018-10-14 DIAGNOSIS — Z86711 Personal history of pulmonary embolism: Secondary | ICD-10-CM | POA: Insufficient documentation

## 2018-10-14 DIAGNOSIS — Z7901 Long term (current) use of anticoagulants: Secondary | ICD-10-CM

## 2018-10-14 DIAGNOSIS — Z1159 Encounter for screening for other viral diseases: Secondary | ICD-10-CM | POA: Diagnosis not present

## 2018-10-14 DIAGNOSIS — Z720 Tobacco use: Secondary | ICD-10-CM | POA: Diagnosis not present

## 2018-10-14 DIAGNOSIS — Z859 Personal history of malignant neoplasm, unspecified: Secondary | ICD-10-CM

## 2018-10-14 DIAGNOSIS — D72821 Monocytosis (symptomatic): Secondary | ICD-10-CM

## 2018-10-14 DIAGNOSIS — E876 Hypokalemia: Secondary | ICD-10-CM

## 2018-10-14 LAB — CBC WITH DIFFERENTIAL/PLATELET
Abs Immature Granulocytes: 0.48 10*3/uL — ABNORMAL HIGH (ref 0.00–0.07)
Basophils Absolute: 0.1 10*3/uL (ref 0.0–0.1)
Basophils Relative: 1 %
Eosinophils Absolute: 0.1 10*3/uL (ref 0.0–0.5)
Eosinophils Relative: 0 %
HCT: 44.5 % (ref 39.0–52.0)
Hemoglobin: 14.5 g/dL (ref 13.0–17.0)
Immature Granulocytes: 2 %
Lymphocytes Relative: 13 %
Lymphs Abs: 2.7 10*3/uL (ref 0.7–4.0)
MCH: 30.9 pg (ref 26.0–34.0)
MCHC: 32.6 g/dL (ref 30.0–36.0)
MCV: 94.9 fL (ref 80.0–100.0)
Monocytes Absolute: 1.6 10*3/uL — ABNORMAL HIGH (ref 0.1–1.0)
Monocytes Relative: 8 %
Neutro Abs: 15.1 10*3/uL — ABNORMAL HIGH (ref 1.7–7.7)
Neutrophils Relative %: 76 %
Platelets: 346 10*3/uL (ref 150–400)
RBC: 4.69 MIL/uL (ref 4.22–5.81)
RDW: 13.6 % (ref 11.5–15.5)
WBC: 20 10*3/uL — ABNORMAL HIGH (ref 4.0–10.5)
nRBC: 0 % (ref 0.0–0.2)

## 2018-10-14 LAB — BASIC METABOLIC PANEL
Anion gap: 10 (ref 5–15)
BUN: 6 mg/dL — ABNORMAL LOW (ref 8–23)
CO2: 21 mmol/L — ABNORMAL LOW (ref 22–32)
Calcium: 8.7 mg/dL — ABNORMAL LOW (ref 8.9–10.3)
Chloride: 104 mmol/L (ref 98–111)
Creatinine, Ser: 0.84 mg/dL (ref 0.61–1.24)
GFR calc Af Amer: >60 mL/min (ref >60)
GFR calc non Af Amer: >60 mL/min (ref >60)
Glucose, Bld: 125 mg/dL — ABNORMAL HIGH (ref 70–99)
Potassium: 3.5 mmol/L (ref 3.5–5.1)
Sodium: 135 mmol/L (ref 135–145)

## 2018-10-14 LAB — LACTATE DEHYDROGENASE: LDH: 128 U/L (ref 98–192)

## 2018-10-14 LAB — SEDIMENTATION RATE: Sed Rate: 9 mm/hr (ref 0–20)

## 2018-10-14 NOTE — Progress Notes (Signed)
No new changes noted today 

## 2018-10-14 NOTE — Discharge Instructions (Signed)
Cataract Surgery, Care After This sheet gives you information about how to care for yourself after your procedure. Your health care provider may also give you more specific instructions. If you have problems or questions, contact your health care provider. What can I expect after the procedure? After the procedure, it is common to have:  Itching.  Discomfort.  Fluid discharge.  Sensitivity to light and to touch.  Bruising in or around the eye.  Mild blurred vision. Follow these instructions at home: Eye care   Do not touch or rub your eyes.  Protect your eyes as told by your health care provider. You may be told to wear a protective eye shield or sunglasses.  Do not put a contact lens into the affected eye or eyes until your health care provider approves.  Keep the area around your eye clean and dry: ? Avoid swimming. ? Do not allow water to hit you directly in the face while showering. ? Keep soap and shampoo out of your eyes.  Check your eye every day for signs of infection. Watch for: ? Redness, swelling, or pain. ? Fluid, blood, or pus. ? Warmth. ? A bad smell. ? Vision that is getting worse. ? Sensitivity that is getting worse. Activity  Do not drive for 24 hours if you were given a sedative during your procedure.  Avoid strenuous activities, such as playing contact sports, for as long as told by your health care provider.  Do not drive or use heavy machinery until your health care provider approves.  Do not bend or lift heavy objects. Bending increases pressure in the eye. You can walk, climb stairs, and do light household chores.  Ask your health care provider when you can return to work. If you work in a dusty environment, you may be advised to wear protective eyewear for a period of time. General instructions  Take or apply over-the-counter and prescription medicines only as told by your health care provider. This includes eye drops.  Keep all follow-up  visits as told by your health care provider. This is important. Contact a health care provider if:  You have increased bruising around your eye.  You have pain that is not helped with medicine.  You have a fever.  You have redness, swelling, or pain in your eye.  You have fluid, blood, or pus coming from your incision.  Your vision gets worse.  Your sensitivity to light gets worse. Get help right away if:  You have sudden loss of vision.  You see flashes of light or spots (floaters).  You have severe eye pain.  You develop nausea or vomiting. Summary  After your procedure, it is common to have itching, discomfort, bruising, fluid discharge, or sensitivity to light.  Follow instructions from your health care provider about caring for your eye after the procedure.  Do not rub your eye after the procedure. You may need to wear eye protection or sunglasses. Do not wear contact lenses. Keep the area around your eye clean and dry.  Avoid activities that require a lot of effort. These include playing sports and lifting heavy objects.  Contact a health care provider if you have increased bruising, pain that does not go away, or a fever. Get help right away if you suddenly lose your vision, see flashes of light or spots, or have severe pain in the eye. This information is not intended to replace advice given to you by your health care provider. Make sure you discuss  any questions you have with your health care provider. Document Released: 11/08/2004 Document Revised: 10/19/2017 Document Reviewed: 10/19/2017 Elsevier Interactive Patient Education  2019 [place] Anesthesia, Adult, Care After This sheet gives you information about how to care for yourself after your procedure. Your health care provider may also give you more specific instructions. If you have problems or questions, contact your health care provider. What can I expect after the procedure? After the  procedure, the following side effects are common:  Pain or discomfort at the IV site.  Nausea.  Vomiting.  Sore throat.  Trouble concentrating.  Feeling cold or chills.  Weak or tired.  Sleepiness and fatigue.  Soreness and body aches. These side effects can affect parts of the body that were not involved in surgery. Follow these instructions at home:  For at least 24 hours after the procedure:  Have a responsible adult stay with you. It is important to have someone help care for you until you are awake and alert.  Rest as needed.  Do not: ? Participate in activities in which you could fall or become injured. ? Drive. ? Use heavy machinery. ? Drink alcohol. ? Take sleeping pills or medicines that cause drowsiness. ? Make important decisions or sign legal documents. ? Take care of children on your own. Eating and drinking  Follow any instructions from your health care provider about eating or drinking restrictions.  When you feel hungry, start by eating small amounts of foods that are soft and easy to digest (bland), such as toast. Gradually return to your regular diet.  Drink enough fluid to keep your urine pale yellow.  If you vomit, rehydrate by drinking water, juice, or clear broth. General instructions  If you have sleep apnea, surgery and certain medicines can increase your risk for breathing problems. Follow instructions from your health care provider about wearing your sleep device: ? Anytime you are sleeping, including during daytime naps. ? While taking prescription pain medicines, sleeping medicines, or medicines that make you drowsy.  Return to your normal activities as told by your health care provider. Ask your health care provider what activities are safe for you.  Take over-the-counter and prescription medicines only as told by your health care provider.  If you smoke, do not smoke without supervision.  Keep all follow-up visits as told by your  health care provider. This is important. Contact a health care provider if:  You have nausea or vomiting that does not get better with medicine.  You cannot eat or drink without vomiting.  You have pain that does not get better with medicine.  You are unable to pass urine.  You develop a skin rash.  You have a fever.  You have redness around your IV site that gets worse. Get help right away if:  You have difficulty breathing.  You have chest pain.  You have blood in your urine or stool, or you vomit blood. Summary  After the procedure, it is common to have a sore throat or nausea. It is also common to feel tired.  Have a responsible adult stay with you for the first 24 hours after general anesthesia. It is important to have someone help care for you until you are awake and alert.  When you feel hungry, start by eating small amounts of foods that are soft and easy to digest (bland), such as toast. Gradually return to your regular diet.  Drink enough fluid to keep your urine pale  yellow.  Return to your normal activities as told by your health care provider. Ask your health care provider what activities are safe for you. This information is not intended to replace advice given to you by your health care provider. Make sure you discuss any questions you have with your health care provider. Document Released: 07/28/2000 Document Revised: 12/05/2016 Document Reviewed: 12/05/2016 Elsevier Interactive Patient Education  2019 [employer].

## 2018-10-15 LAB — NOVEL CORONAVIRUS, NAA (HOSP ORDER, SEND-OUT TO REF LAB; TAT 18-24 HRS): SARS-CoV-2, NAA: NOT DETECTED

## 2018-10-18 ENCOUNTER — Encounter
Admission: RE | Disposition: A | Discharge status: Home / Self Care | Payer: Self-pay | Source: Home / Self Care | Attending: Ophthalmology

## 2018-10-18 ENCOUNTER — Ambulatory Visit
Admission: RE | Admit: 2018-10-18 | Discharge: 2018-10-18 | Disposition: A | Discharge status: Home / Self Care | Payer: Medicare PPO | Attending: Ophthalmology | Admitting: Ophthalmology

## 2018-10-18 ENCOUNTER — Other Ambulatory Visit: Payer: Self-pay

## 2018-10-18 ENCOUNTER — Ambulatory Visit: Payer: Medicare PPO | Admitting: Anesthesiology

## 2018-10-18 DIAGNOSIS — I10 Essential (primary) hypertension: Secondary | ICD-10-CM | POA: Diagnosis not present

## 2018-10-18 DIAGNOSIS — K219 Gastro-esophageal reflux disease without esophagitis: Secondary | ICD-10-CM | POA: Insufficient documentation

## 2018-10-18 DIAGNOSIS — Z888 Allergy status to other drugs, medicaments and biological substances status: Secondary | ICD-10-CM | POA: Diagnosis not present

## 2018-10-18 DIAGNOSIS — H2512 Age-related nuclear cataract, left eye: Secondary | ICD-10-CM | POA: Insufficient documentation

## 2018-10-18 DIAGNOSIS — Z79899 Other long term (current) drug therapy: Secondary | ICD-10-CM | POA: Insufficient documentation

## 2018-10-18 DIAGNOSIS — F17200 Nicotine dependence, unspecified, uncomplicated: Secondary | ICD-10-CM | POA: Diagnosis not present

## 2018-10-18 DIAGNOSIS — Z86718 Personal history of other venous thrombosis and embolism: Secondary | ICD-10-CM | POA: Insufficient documentation

## 2018-10-18 DIAGNOSIS — Z886 Allergy status to analgesic agent status: Secondary | ICD-10-CM | POA: Diagnosis not present

## 2018-10-18 DIAGNOSIS — Z86711 Personal history of pulmonary embolism: Secondary | ICD-10-CM | POA: Insufficient documentation

## 2018-10-18 DIAGNOSIS — Z7901 Long term (current) use of anticoagulants: Secondary | ICD-10-CM | POA: Diagnosis not present

## 2018-10-18 HISTORY — PX: CATARACT EXTRACTION W/PHACO: SHX586

## 2018-10-18 HISTORY — DX: Carrier or suspected carrier of methicillin resistant Staphylococcus aureus: Z22.322

## 2018-10-18 LAB — MULTIPLE MYELOMA PANEL, SERUM
Albumin SerPl Elph-Mcnc: 3.1 g/dL (ref 2.9–4.4)
Albumin/Glob SerPl: 1.2 (ref 0.7–1.7)
Alpha 1: 0.2 g/dL (ref 0.0–0.4)
Alpha2 Glob SerPl Elph-Mcnc: 0.9 g/dL (ref 0.4–1.0)
B-Globulin SerPl Elph-Mcnc: 1.1 g/dL (ref 0.7–1.3)
Gamma Glob SerPl Elph-Mcnc: 0.5 g/dL (ref 0.4–1.8)
Globulin, Total: 2.8 g/dL (ref 2.2–3.9)
IgA: 430 mg/dL (ref 61–437)
IgG (Immunoglobin G), Serum: 553 mg/dL — ABNORMAL LOW (ref 603–1613)
IgM (Immunoglobulin M), Srm: 58 mg/dL (ref 20–172)
Total Protein ELP: 5.9 g/dL — ABNORMAL LOW (ref 6.0–8.5)

## 2018-10-18 SURGERY — PHACOEMULSIFICATION, CATARACT, WITH IOL INSERTION
Anesthesia: Monitor Anesthesia Care | Site: Eye | Laterality: Left

## 2018-10-18 MED ORDER — ARMC OPHTHALMIC DILATING DROPS
1.0000 "application " | OPHTHALMIC | Status: DC | PRN
  Administered 2018-10-18 (×3): 1 via OPHTHALMIC

## 2018-10-18 MED ORDER — SODIUM HYALURONATE 23 MG/ML IO SOLN
INTRAOCULAR | Status: DC | PRN
  Administered 2018-10-18: 0.6 mL via INTRAOCULAR

## 2018-10-18 MED ORDER — FENTANYL CITRATE (PF) 100 MCG/2ML IJ SOLN
INTRAMUSCULAR | Status: DC | PRN
  Administered 2018-10-18: 50 ug via INTRAVENOUS

## 2018-10-18 MED ORDER — TETRACAINE HCL 0.5 % OP SOLN
1.0000 [drp] | OPHTHALMIC | Status: DC | PRN
  Administered 2018-10-18 (×3): 1 [drp] via OPHTHALMIC

## 2018-10-18 MED ORDER — ONDANSETRON HCL 4 MG/2ML IJ SOLN: 4.0000 mg | Freq: Once | INTRAMUSCULAR | Status: DC | PRN

## 2018-10-18 MED ORDER — LIDOCAINE HCL (PF) 2 % IJ SOLN
INTRAOCULAR | Status: DC | PRN
  Administered 2018-10-18: 1 mL via INTRAOCULAR

## 2018-10-18 MED ORDER — MIDAZOLAM HCL 2 MG/2ML IJ SOLN
INTRAMUSCULAR | Status: DC | PRN
  Administered 2018-10-18: 2 mg via INTRAVENOUS

## 2018-10-18 MED ORDER — MOXIFLOXACIN HCL 0.5 % OP SOLN
OPHTHALMIC | Status: DC | PRN
  Administered 2018-10-18: 0.2 mL via OPHTHALMIC

## 2018-10-18 MED ORDER — SODIUM HYALURONATE 10 MG/ML IO SOLN
INTRAOCULAR | Status: DC | PRN
  Administered 2018-10-18: 0.55 mL via INTRAOCULAR

## 2018-10-18 MED ORDER — EPINEPHRINE PF 1 MG/ML IJ SOLN
INTRAOCULAR | Status: DC | PRN
  Administered 2018-10-18: 13:00:00 78 mL via OPHTHALMIC

## 2018-10-18 SURGICAL SUPPLY — 19 items
CANNULA ANT/CHMB 27G (MISCELLANEOUS) ×2 IMPLANT
CANNULA ANT/CHMB 27GA (MISCELLANEOUS) ×6 IMPLANT
DISSECTOR HYDRO NUCLEUS 50X22 (MISCELLANEOUS) ×3 IMPLANT
GLOVE SURG LX 7.5 STRW (GLOVE) ×4
GLOVE SURG LX STRL 7.5 STRW (GLOVE) ×1 IMPLANT
GLOVE SURG SYN 8.5  E (GLOVE) ×2
GLOVE SURG SYN 8.5 E (GLOVE) ×1 IMPLANT
GLOVE SURG SYN 8.5 PF PI (GLOVE) ×1 IMPLANT
GOWN STRL REUS W/ TWL LRG LVL3 (GOWN DISPOSABLE) ×2 IMPLANT
GOWN STRL REUS W/TWL LRG LVL3 (GOWN DISPOSABLE) ×4
LENS IOL TECNIS ITEC 21.0 (Intraocular Lens) ×2 IMPLANT
MARKER SKIN DUAL TIP RULER LAB (MISCELLANEOUS) ×3 IMPLANT
PACK DR. KING ARMS (PACKS) ×3 IMPLANT
PACK EYE AFTER SURG (MISCELLANEOUS) ×3 IMPLANT
PACK OPTHALMIC (MISCELLANEOUS) ×3 IMPLANT
SYR 3ML LL SCALE MARK (SYRINGE) ×3 IMPLANT
SYR TB 1ML LUER SLIP (SYRINGE) ×3 IMPLANT
WATER STERILE IRR 250ML POUR (IV SOLUTION) ×3 IMPLANT
WIPE NON LINTING 3.25X3.25 (MISCELLANEOUS) ×3 IMPLANT

## 2018-10-18 NOTE — Op Note (Signed)
OPERATIVE NOTE  Brent Diaz [id number] 10/18/2018   PREOPERATIVE DIAGNOSIS:  Nuclear sclerotic cataract left eye.  H25.12   POSTOPERATIVE DIAGNOSIS:    Nuclear sclerotic cataract left eye.     PROCEDURE:  Phacoemusification with posterior chamber intraocular lens placement of the left eye   LENS:   Implant Name Type Inv. Item Serial No. Manufacturer Lot No. LRB No. Used Action  LENS IOL DIOP 21.0 - [id number] Intraocular Lens LENS IOL DIOP 21.0 [id number] AMO  Left 1 Implanted       PCB00 +21.0   ULTRASOUND TIME: 1 minutes 05 seconds.  CDE 8.51   SURGEON:  Benay Pillow, MD, MPH   ANESTHESIA:  Topical with tetracaine drops augmented with 1% preservative-free intracameral lidocaine.  ESTIMATED BLOOD LOSS: <1 mL   COMPLICATIONS:  None.   DESCRIPTION OF PROCEDURE:  The patient was identified in the holding room and transported to the operating room and placed in the supine position under the operating microscope.  The left eye was identified as the operative eye and it was prepped and draped in the usual sterile ophthalmic fashion.   A 1.0 millimeter clear-corneal paracentesis was made at the 5:00 position. 0.5 ml of preservative-free 1% lidocaine with epinephrine was injected into the anterior chamber.  The anterior chamber was filled with Healon 5 viscoelastic.  A 2.4 millimeter keratome was used to make a near-clear corneal incision at the 2:00 position.  A curvilinear capsulorrhexis was made with a cystotome and capsulorrhexis forceps.  Balanced salt solution was used to hydrodissect and hydrodelineate the nucleus.   Phacoemulsification was then used in stop and chop fashion to remove the lens nucleus and epinucleus.  The remaining cortex was then removed using the irrigation and aspiration handpiece. Healon was then placed into the capsular bag to distend it for lens placement.  A lens was then injected into the capsular bag.  The remaining viscoelastic was aspirated.   Wounds  were hydrated with balanced salt solution.  The anterior chamber was inflated to a physiologic pressure with balanced salt solution.  Intracameral vigamox 0.1 mL undiltued was injected into the eye and a drop placed onto the ocular surface.  No wound leaks were noted.  The patient was taken to the recovery room in stable condition without complications of anesthesia or surgery  Benay Pillow 10/18/2018, 1:33 PM

## 2018-10-18 NOTE — Transfer of Care (Signed)
Immediate Anesthesia Transfer of Care Note  Patient: Brent Diaz  Procedure(s) Performed: CATARACT EXTRACTION PHACO AND INTRAOCULAR LENS PLACEMENT (IOC) LEFT (Left Eye)  Patient Location: PACU  Anesthesia Type: MAC  Level of Consciousness: awake, alert  and patient cooperative  Airway and Oxygen Therapy: Patient Spontanous Breathing and Patient connected to supplemental oxygen  Post-op Assessment: Post-op Vital signs reviewed, Patient's Cardiovascular Status Stable, Respiratory Function Stable, Patent Airway and No signs of Nausea or vomiting  Post-op Vital Signs: Reviewed and stable  Complications: No apparent anesthesia complications

## 2018-10-18 NOTE — H&P (Signed)

## 2018-10-18 NOTE — Anesthesia Procedure Notes (Signed)
Procedure Name: MAC Date/Time: 10/18/2018 1:13 PM Performed by: Cameron Ali, CRNA Pre-anesthesia Checklist: Patient identified, Emergency Drugs available, Suction available, Timeout performed and Patient being monitored Patient Re-evaluated:Patient Re-evaluated prior to induction Oxygen Delivery Method: Nasal cannula Placement Confirmation: positive ETCO2

## 2018-10-18 NOTE — Anesthesia Postprocedure Evaluation (Signed)
Anesthesia Post Note  Patient: Brent Diaz  Procedure(s) Performed: CATARACT EXTRACTION PHACO AND INTRAOCULAR LENS PLACEMENT (IOC) LEFT (Left Eye)  Patient location during evaluation: PACU Anesthesia Type: MAC Level of consciousness: awake and alert Pain management: pain level controlled Vital Signs Assessment: post-procedure vital signs reviewed and stable Respiratory status: spontaneous breathing, nonlabored ventilation, respiratory function stable and patient connected to nasal cannula oxygen Cardiovascular status: stable and blood pressure returned to baseline Postop Assessment: no apparent nausea or vomiting Anesthetic complications: no    Veda Canning

## 2018-10-18 NOTE — Anesthesia Preprocedure Evaluation (Signed)
Anesthesia Evaluation  Patient identified by MRN, date of birth, ID band Patient awake    Reviewed: Allergy & Precautions, NPO status , Patient's Chart, lab work & pertinent test results  Airway Mallampati: II  TM Distance: >3 FB     Dental   Pulmonary Current Smoker,    breath sounds clear to auscultation       Cardiovascular hypertension,  Rhythm:Regular Rate:Normal     Neuro/Psych    GI/Hepatic GERD  ,  Endo/Other    Renal/GU      Musculoskeletal   Abdominal   Peds  Hematology   Anesthesia Other Findings   Reproductive/Obstetrics                             Anesthesia Physical Anesthesia Plan  ASA: II  Anesthesia Plan: MAC   Post-op Pain Management:    Induction: Intravenous  PONV Risk Score and Plan:   Airway Management Planned: Natural Airway and Nasal Cannula  Additional Equipment:   Intra-op Plan:   Post-operative Plan:   Informed Consent: I have reviewed the patients History and Physical, chart, labs and discussed the procedure including the risks, benefits and alternatives for the proposed anesthesia with the patient or authorized representative who has indicated his/her understanding and acceptance.       Plan Discussed with: CRNA  Anesthesia Plan Comments:         Anesthesia Quick Evaluation

## 2018-10-19 ENCOUNTER — Encounter: Payer: Self-pay | Admitting: Ophthalmology

## 2018-10-25 LAB — CALR + JAK2 E12-15 + MPL (REFLEXED)

## 2018-10-25 LAB — JAK2 V617F, W REFLEX TO CALR/E12/MPL

## 2018-11-30 DIAGNOSIS — E559 Vitamin D deficiency, unspecified: Secondary | ICD-10-CM | POA: Insufficient documentation

## 2018-11-30 DIAGNOSIS — E538 Deficiency of other specified B group vitamins: Secondary | ICD-10-CM | POA: Insufficient documentation

## 2018-11-30 DIAGNOSIS — K219 Gastro-esophageal reflux disease without esophagitis: Secondary | ICD-10-CM | POA: Insufficient documentation

## 2018-12-02 ENCOUNTER — Other Ambulatory Visit: Payer: Self-pay

## 2018-12-02 ENCOUNTER — Other Ambulatory Visit
Admission: RE | Admit: 2018-12-02 | Discharge: 2018-12-02 | Disposition: A | Discharge status: Home / Self Care | Payer: Medicare PPO | Source: Ambulatory Visit | Attending: Ophthalmology | Admitting: Ophthalmology

## 2018-12-02 DIAGNOSIS — Z20828 Contact with and (suspected) exposure to other viral communicable diseases: Secondary | ICD-10-CM | POA: Diagnosis present

## 2018-12-02 LAB — SARS CORONAVIRUS 2 (TAT 6-24 HRS): SARS Coronavirus 2: NEGATIVE

## 2018-12-02 NOTE — Discharge Instructions (Signed)

## 2018-12-06 ENCOUNTER — Encounter
Admission: RE | Disposition: A | Discharge status: Home / Self Care | Payer: Self-pay | Source: Home / Self Care | Attending: Ophthalmology

## 2018-12-06 ENCOUNTER — Other Ambulatory Visit: Payer: Self-pay

## 2018-12-06 ENCOUNTER — Ambulatory Visit
Admission: RE | Admit: 2018-12-06 | Discharge: 2018-12-06 | Disposition: A | Discharge status: Home / Self Care | Payer: Medicare PPO | Attending: Ophthalmology | Admitting: Ophthalmology

## 2018-12-06 ENCOUNTER — Ambulatory Visit: Payer: Medicare PPO | Admitting: Anesthesiology

## 2018-12-06 DIAGNOSIS — K5090 Crohn's disease, unspecified, without complications: Secondary | ICD-10-CM | POA: Insufficient documentation

## 2018-12-06 DIAGNOSIS — Z79899 Other long term (current) drug therapy: Secondary | ICD-10-CM | POA: Diagnosis not present

## 2018-12-06 DIAGNOSIS — Z7901 Long term (current) use of anticoagulants: Secondary | ICD-10-CM | POA: Insufficient documentation

## 2018-12-06 DIAGNOSIS — I10 Essential (primary) hypertension: Secondary | ICD-10-CM | POA: Diagnosis not present

## 2018-12-06 DIAGNOSIS — F17200 Nicotine dependence, unspecified, uncomplicated: Secondary | ICD-10-CM | POA: Diagnosis not present

## 2018-12-06 DIAGNOSIS — Z85828 Personal history of other malignant neoplasm of skin: Secondary | ICD-10-CM | POA: Insufficient documentation

## 2018-12-06 DIAGNOSIS — Z86711 Personal history of pulmonary embolism: Secondary | ICD-10-CM | POA: Diagnosis not present

## 2018-12-06 DIAGNOSIS — Z8614 Personal history of Methicillin resistant Staphylococcus aureus infection: Secondary | ICD-10-CM | POA: Insufficient documentation

## 2018-12-06 DIAGNOSIS — H2511 Age-related nuclear cataract, right eye: Secondary | ICD-10-CM | POA: Diagnosis present

## 2018-12-06 DIAGNOSIS — Z86718 Personal history of other venous thrombosis and embolism: Secondary | ICD-10-CM | POA: Insufficient documentation

## 2018-12-06 HISTORY — PX: CATARACT EXTRACTION W/PHACO: SHX586

## 2018-12-06 SURGERY — PHACOEMULSIFICATION, CATARACT, WITH IOL INSERTION
Anesthesia: Monitor Anesthesia Care | Site: Eye | Laterality: Right

## 2018-12-06 MED ORDER — SODIUM HYALURONATE 10 MG/ML IO SOLN
INTRAOCULAR | Status: DC | PRN
  Administered 2018-12-06: 0.55 mL via INTRAOCULAR

## 2018-12-06 MED ORDER — MOXIFLOXACIN HCL 0.5 % OP SOLN
OPHTHALMIC | Status: DC | PRN
  Administered 2018-12-06: 0.2 mL via OPHTHALMIC

## 2018-12-06 MED ORDER — FENTANYL CITRATE (PF) 100 MCG/2ML IJ SOLN
INTRAMUSCULAR | Status: DC | PRN
  Administered 2018-12-06: 50 ug via INTRAVENOUS

## 2018-12-06 MED ORDER — SODIUM HYALURONATE 23 MG/ML IO SOLN
INTRAOCULAR | Status: DC | PRN
  Administered 2018-12-06: 0.6 mL via INTRAOCULAR

## 2018-12-06 MED ORDER — LIDOCAINE HCL (PF) 2 % IJ SOLN
INTRAOCULAR | Status: DC | PRN
  Administered 2018-12-06: 1 mL via INTRAOCULAR

## 2018-12-06 MED ORDER — MIDAZOLAM HCL 2 MG/2ML IJ SOLN
INTRAMUSCULAR | Status: DC | PRN
  Administered 2018-12-06: 2 mg via INTRAVENOUS

## 2018-12-06 MED ORDER — EPINEPHRINE PF 1 MG/ML IJ SOLN
INTRAOCULAR | Status: DC | PRN
  Administered 2018-12-06: 88 mL via OPHTHALMIC

## 2018-12-06 MED ORDER — TETRACAINE HCL 0.5 % OP SOLN
1.0000 [drp] | OPHTHALMIC | Status: DC | PRN
  Administered 2018-12-06 (×3): 1 [drp] via OPHTHALMIC

## 2018-12-06 MED ORDER — ARMC OPHTHALMIC DILATING DROPS
1.0000 "application " | OPHTHALMIC | Status: DC | PRN
  Administered 2018-12-06 (×3): 1 via OPHTHALMIC

## 2018-12-06 SURGICAL SUPPLY — 19 items
CANNULA ANT/CHMB 27G (MISCELLANEOUS) ×2 IMPLANT
CANNULA ANT/CHMB 27GA (MISCELLANEOUS) ×6 IMPLANT
DISSECTOR HYDRO NUCLEUS 50X22 (MISCELLANEOUS) ×3 IMPLANT
GLOVE SURG LX 7.5 STRW (GLOVE) ×2
GLOVE SURG LX STRL 7.5 STRW (GLOVE) ×1 IMPLANT
GLOVE SURG SYN 8.5  E (GLOVE) ×2
GLOVE SURG SYN 8.5 E (GLOVE) ×1 IMPLANT
GLOVE SURG SYN 8.5 PF PI (GLOVE) ×1 IMPLANT
GOWN STRL REUS W/ TWL LRG LVL3 (GOWN DISPOSABLE) ×2 IMPLANT
GOWN STRL REUS W/TWL LRG LVL3 (GOWN DISPOSABLE) ×4
LENS IOL TECNIS ITEC 21.0 (Intraocular Lens) ×2 IMPLANT
MARKER SKIN DUAL TIP RULER LAB (MISCELLANEOUS) ×3 IMPLANT
PACK DR. KING ARMS (PACKS) ×3 IMPLANT
PACK EYE AFTER SURG (MISCELLANEOUS) ×3 IMPLANT
PACK OPTHALMIC (MISCELLANEOUS) ×3 IMPLANT
SYR 3ML LL SCALE MARK (SYRINGE) ×3 IMPLANT
SYR TB 1ML LUER SLIP (SYRINGE) ×3 IMPLANT
WATER STERILE IRR 250ML POUR (IV SOLUTION) ×3 IMPLANT
WIPE NON LINTING 3.25X3.25 (MISCELLANEOUS) ×3 IMPLANT

## 2018-12-06 NOTE — Anesthesia Postprocedure Evaluation (Signed)
Anesthesia Post Note  Patient: Brent Diaz  Procedure(s) Performed: CATARACT EXTRACTION PHACO AND INTRAOCULAR LENS PLACEMENT (IOC) RIGHT (Right Eye)  Patient location during evaluation: PACU Anesthesia Type: MAC Level of consciousness: awake and alert Pain management: pain level controlled Vital Signs Assessment: post-procedure vital signs reviewed and stable Respiratory status: spontaneous breathing Cardiovascular status: blood pressure returned to baseline Postop Assessment: no apparent nausea or vomiting, adequate PO intake and no headache Anesthetic complications: no    Adele Barthel Juandavid Dallman

## 2018-12-06 NOTE — Anesthesia Procedure Notes (Signed)
Procedure Name: MAC Performed by: Tamura Lasky, CRNA Pre-anesthesia Checklist: Patient identified, Emergency Drugs available, Suction available, Timeout performed and Patient being monitored Patient Re-evaluated:Patient Re-evaluated prior to induction Oxygen Delivery Method: Nasal cannula Placement Confirmation: positive ETCO2       

## 2018-12-06 NOTE — Anesthesia Preprocedure Evaluation (Signed)
Anesthesia Evaluation  Patient identified by MRN, date of birth, ID band Patient awake    History of Anesthesia Complications Negative for: history of anesthetic complications  Airway Mallampati: III   Neck ROM: Full    Dental no notable dental hx.    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)   Pulmonary exam normal        Cardiovascular Exercise Tolerance: Good hypertension, Normal cardiovascular exam     Neuro/Psych negative neurological ROS  negative psych ROS   GI/Hepatic Neg liver ROS, Crohn's disease   Endo/Other  negative endocrine ROS  Renal/GU negative Renal ROS     Musculoskeletal negative musculoskeletal ROS (+)   Abdominal   Peds  Hematology apixaban   Anesthesia Other Findings   Reproductive/Obstetrics                             Anesthesia Physical Anesthesia Plan  ASA: III  Anesthesia Plan: MAC   Post-op Pain Management:    Induction: Intravenous  PONV Risk Score and Plan: 0  Airway Management Planned: Nasal Cannula  Additional Equipment: None  Intra-op Plan:   Post-operative Plan:   Informed Consent: I have reviewed the patients History and Physical, chart, labs and discussed the procedure including the risks, benefits and alternatives for the proposed anesthesia with the patient or authorized representative who has indicated his/her understanding and acceptance.       Plan Discussed with:   Anesthesia Plan Comments:         Anesthesia Quick Evaluation

## 2018-12-06 NOTE — Op Note (Signed)
OPERATIVE NOTE  Brent Diaz [id number] 12/06/2018   PREOPERATIVE DIAGNOSIS:  Nuclear sclerotic cataract right eye.  H25.11   POSTOPERATIVE DIAGNOSIS:    Nuclear sclerotic cataract right eye.     PROCEDURE:  Phacoemusification with posterior chamber intraocular lens placement of the right eye   LENS:   Implant Name Type Inv. Item Serial No. Manufacturer Lot No. LRB No. Used Action  LENS IOL DIOP 21.0 - [id number] Intraocular Lens LENS IOL DIOP 21.0 [id number]  Right 1 Implanted       PCB00 +21.0   ULTRASOUND TIME: 1 minutes 15 seconds.  CDE 9.66   SURGEON:  Benay Pillow, MD, MPH  ANESTHESIOLOGIST: Anesthesiologist: Page, Adele Barthel, MD CRNA: Mayme Genta, CRNA   ANESTHESIA:  Topical with tetracaine drops augmented with 1% preservative-free intracameral lidocaine.  ESTIMATED BLOOD LOSS: less than 1 mL.   COMPLICATIONS:  None.   DESCRIPTION OF PROCEDURE:  The patient was identified in the holding room and transported to the operating room and placed in the supine position under the operating microscope.  The right eye was identified as the operative eye and it was prepped and draped in the usual sterile ophthalmic fashion.   A 1.0 millimeter clear-corneal paracentesis was made at the 10:30 position. 0.5 ml of preservative-free 1% lidocaine with epinephrine was injected into the anterior chamber.  The anterior chamber was filled with Healon 5 viscoelastic.  A 2.4 millimeter keratome was used to make a near-clear corneal incision at the 8:00 position.  A curvilinear capsulorrhexis was made with a cystotome and capsulorrhexis forceps.  Balanced salt solution was used to hydrodissect and hydrodelineate the nucleus.   Phacoemulsification was then used in stop and chop fashion to remove the lens nucleus and epinucleus.  The remaining cortex was then removed using the irrigation and aspiration handpiece. Healon was then placed into the capsular bag to distend it for lens placement.   A lens was then injected into the capsular bag.  The remaining viscoelastic was aspirated.   Wounds were hydrated with balanced salt solution.  The anterior chamber was inflated to a physiologic pressure with balanced salt solution.   Intracameral vigamox 0.1 mL undiluted was injected into the eye and a drop placed onto the ocular surface.  No wound leaks were noted.  The patient was taken to the recovery room in stable condition without complications of anesthesia or surgery  Benay Pillow 12/06/2018, 11:42 AM

## 2018-12-06 NOTE — Transfer of Care (Signed)
Immediate Anesthesia Transfer of Care Note  Patient: Brent Diaz  Procedure(s) Performed: CATARACT EXTRACTION PHACO AND INTRAOCULAR LENS PLACEMENT (IOC) RIGHT (Right Eye)  Patient Location: PACU  Anesthesia Type: MAC  Level of Consciousness: awake, alert  and patient cooperative  Airway and Oxygen Therapy: Patient Spontanous Breathing and Patient connected to supplemental oxygen  Post-op Assessment: Post-op Vital signs reviewed, Patient's Cardiovascular Status Stable, Respiratory Function Stable, Patent Airway and No signs of Nausea or vomiting  Post-op Vital Signs: Reviewed and stable  Complications: No apparent anesthesia complications

## 2018-12-06 NOTE — H&P (Signed)

## 2018-12-07 ENCOUNTER — Encounter: Payer: Self-pay | Admitting: Ophthalmology

## 2019-04-14 ENCOUNTER — Ambulatory Visit: Payer: Medicare PPO | Admitting: Hematology and Oncology

## 2019-04-14 ENCOUNTER — Other Ambulatory Visit: Payer: Medicare PPO

## 2019-05-24 ENCOUNTER — Other Ambulatory Visit: Payer: Self-pay

## 2019-05-24 DIAGNOSIS — E876 Hypokalemia: Secondary | ICD-10-CM

## 2019-05-24 NOTE — Progress Notes (Signed)
[facility]  [address], [address] [place], [place] [address] Phone: [phone]  Fax: [phone]   Telemedicine Office Visit:  05/26/2019  Referring physician: Lynnell Jude, MD  I connected with [name] on 05/26/2019 at 12:19 PM by videoconferencing and verified that I was speaking with the correct person using 2 identifiers.  The patient was at home.  I discussed the limitations, risk, security and privacy concerns of performing an evaluation and management service by videoconferencing and the availability of in person appointments.  I also discussed with the patient that there may be a patient responsible charge related to this service.  The patient expressed understanding and agreed to proceed.   Chief Complaint: HENRIK ORIHUELA is a 64 y.o. male with leukocytosis who is seen for 7 month assessment.  HPI: The patient was last seen in the hematology clinic on 10/14/2018 as a new patient.  At that time, he felt good. He denied any B symptoms. He had a recent Crohn's flare and was on budesonide. Exam revealed no adenopathy or hepatosplenomegaly.    Work up revealed a hematocrit 44.5, hemoglobin 14.5, MCV 94.9, platelets 346,000, WBC 20,000 (ANC 15,100).  Monocyte count was 1600.  Creatinine was 0.84, calcium 8.7. Sed rate was 9. LDH 128.  IgG was 553 [phone]).  M-spike was 0.  JAK2 V617F,exon 12-15, CALR, and MPL mutation were negative.   Patient underwent left cataract surgery on 10/18/2018 and right cataract surgery on 12/06/2018 by Dr. Benay Pillow.  His vision is better.   Labs on 05/25/2019: Hematocrit 47.4, hemoglobin 15.6, MCV 95.8, platelets 279,000, WBC 24,400 (ANC 16,500).  Monocyte count was 2,100. Potassium was 3.9. Sed rate was 1.   During the interim, he has felt the same. He notes bruising on Eliquis.  He has no lumps or bumps.   He notes some loose stool. He reports no issues with his  Crohn's disease.  He has not taken any potassium  pills for the past few weeks. He notes dysphagia with potassium pills.   He states that he needs to see the dermatologist.   Past Medical History:  Diagnosis Date  . Cancer ([place])    skin  . Crohn's disease ([place])   . GERD (gastroesophageal reflux disease)   . Hypertension   . MRSA (methicillin resistant staph aureus) culture positive    cultured during sinus surgery 05/2017  . Neutrophilia   . Pulmonary embolism ([place]) 10/2017  . Shingles    resolved  . Wears contact lenses    Left eye    Past Surgical History:  Procedure Laterality Date  . ABDOMINAL SURGERY    . BOWEL RESECTION     x 3  . CATARACT EXTRACTION W/PHACO Left 10/18/2018   Procedure: CATARACT EXTRACTION PHACO AND INTRAOCULAR LENS PLACEMENT ([place]) LEFT;  Surgeon: Eulogio Bear, MD;  Location: [facility];  Service: Ophthalmology;  Laterality: Left;  . CATARACT EXTRACTION W/PHACO Right 12/06/2018   Procedure: CATARACT EXTRACTION PHACO AND INTRAOCULAR LENS PLACEMENT (IOC) RIGHT;  Surgeon: Eulogio Bear, MD;  Location: [facility];  Service: Ophthalmology;  Laterality: Right;  . HYDROCELE EXCISION / REPAIR    . IMAGE GUIDED SINUS SURGERY N/A 05/21/2017   Procedure: IMAGE GUIDED SINUS SURGERY;  Surgeon: Margaretha Sheffield, MD;  Location: [facility];  Service: ENT;  Laterality: N/A;  need stryker disk  . MAXILLARY ANTROSTOMY Right 05/21/2017   Procedure: MAXILLARY ANTROSTOMY;  Surgeon: Margaretha Sheffield, MD;  Location[facility];  Service: ENT;  Laterality: Right;  . SEPTOPLASTY N/A 05/21/2017   Procedure: SEPTOPLASTY;  Surgeon: Margaretha Sheffield, MD;  Location: [facility];  Service: ENT;  Laterality: N/A;    Family History  Problem Relation Age of Onset  . Aneurysm Father 49  . Dementia Mother 54    Social History:  reports that he has been smoking cigarettes. He has a 23.50 pack-year smoking history. He has never used smokeless tobacco. He reports that he does not drink alcohol or use  drugs. He smokes 10-15 cigarettes daily. He has smoked since he was 64yo and is not motivated to quit. He denies any known exposure to radiation or toxins. 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 He lives in [place].  The patient is alone today.  Participants in the patient's visit and their role in the encounter included the patient and Waymon Budge, RN, today.  The intake visit was provided by Waymon Budge, RN.  Allergies:  Allergies  Allergen Reactions  . Amlodipine Swelling    feet  . Aspirin Other (See Comments)    Peptic ulcers  . Flagyl [Metronidazole]     Gives neuropathy if taken too long     Current Medications: Current Outpatient Medications  Medication Sig Dispense Refill  . apixaban (ELIQUIS) 5 MG TABS tablet Take 1 tablet (5 mg total) by mouth 2 (two) times daily. Start 6/19 60 tablet 0  . budesonide (ENTOCORT EC) 3 MG 24 hr capsule Take 6 mg by mouth daily.     . butalbital-acetaminophen-caffeine (FIORICET) 50-325-40 MG tablet Take 1 tablet by mouth every 4 (four) hours as needed for headache.    . cycloSPORINE (RESTASIS) 0.05 % ophthalmic emulsion Place 1 drop into the right eye 2 (two) times daily.    . folic acid (FOLVITE) 1 MG tablet Take 1 mg by mouth daily.  6  . gentamicin ointment (GARAMYCIN) 0.1 % Apply 1 application as needed topically.    . Melatonin 5 MG TABS Take 10 mg by mouth at bedtime as needed.    . montelukast (SINGULAIR) 10 MG tablet Take 10 mg by mouth every morning.     . pantoprazole (PROTONIX) 40 MG tablet Take 40 mg by mouth 2 (two) times daily.  6  . vitamin B-12 (CYANOCOBALAMIN) 1000 MCG tablet Take 1,000 mcg by mouth daily.    . Vitamin D, Ergocalciferol, (DRISDOL) 50000 units CAPS capsule Take 50,000 Units by mouth every 7 (seven) days.    . B Complex-Folic Acid (B COMPLEX-VITAMIN B12 PO) Take 1,000 mcg by mouth daily.     Marland Kitchen LISINOPRIL-HYDROCHLOROTHIAZIDE PO Take 1 tablet by mouth daily. 5 mg daily    .  loperamide (IMODIUM) 2 MG capsule Take 2 mg by mouth as needed for diarrhea or loose stools.    . ondansetron (ZOFRAN ODT) 4 MG disintegrating tablet Take 1 tablet (4 mg total) by mouth every 8 (eight) hours as needed for nausea or vomiting. (Patient not taking: Reported on 10/11/2018) 20 tablet 0  . potassium chloride SA (K-DUR,KLOR-CON) 20 MEQ tablet Take 1 tablet by mouth daily.     . Probiotic Product (ALIGN) 4 MG CAPS Take 4 mg daily by mouth.    . zolpidem (AMBIEN) 10 MG tablet Take 10 mg by mouth at bedtime as needed for sleep.     No current facility-administered medications for this visit.    Review of Systems  Constitutional: Negative.  Negative for chills, diaphoresis, fever,  malaise/fatigue and weight loss.       Feels the same.  HENT: Negative.  Negative for congestion, ear pain, hearing loss, nosebleeds, sinus pain and sore throat.   Eyes: Negative for blurred vision (improved), double vision and pain.       S/p bilateral cataract surgery.  Respiratory: Negative.  Negative for cough, shortness of breath and wheezing.   Cardiovascular: Negative.  Negative for chest pain, palpitations, orthopnea and leg swelling.  Gastrointestinal: Positive for diarrhea (loose stools). Negative for abdominal pain (Crohn's flare up), blood in stool, constipation, heartburn, melena, nausea and vomiting.       No recent Crohn's flares.  2-3 BMs daily. Dysphagia with potassium pills.  Genitourinary: Negative.  Negative for dysuria, frequency, hematuria and urgency.  Musculoskeletal: Negative.  Negative for back pain, joint pain and myalgias.  Skin: Negative.  Negative for rash.  Neurological: Negative.  Negative for dizziness, tingling, sensory change, focal weakness, weakness and headaches.  Endo/Heme/Allergies: Bruises/bleeds easily (bruises on Eliquis).  Psychiatric/Behavioral: Negative.  Negative for depression and memory loss. The patient is not nervous/anxious and does not have insomnia.   All  other systems reviewed and are negative.  Performance status (ECOG): 1  Physical Exam  Constitutional: He is oriented to person, place, and time. He appears well-developed and well-nourished. No distress.  HENT:  Head: Normocephalic and atraumatic.  Short gray hair.  Mustache.  Eyes: Conjunctivae and EOM are normal. No scleral icterus.  Glasses.  Blue eyes.  Neurological: He is alert and oriented to person, place, and time.  Skin: He is not diaphoretic.  Psychiatric: He has a normal mood and affect. His behavior is normal. Judgment and thought content normal.  Nursing note reviewed.   Appointment on 05/25/2019  Component Date Value Ref Range Status  . Potassium 05/25/2019 3.9  3.5 - 5.1 mmol/L Final   Performed at [facility], [address], [place], [place] [address]  . Sed Rate 05/25/2019 1  0 - 20 mm/hr Final   Performed at [facility], [address] [place], [place] [address]  . WBC 05/25/2019 24.4* 4.0 - 10.5 K/uL Final  . RBC 05/25/2019 4.95  4.22 - 5.81 MIL/uL Final  . Hemoglobin 05/25/2019 15.6  13.0 - 17.0 g/dL Final  . HCT 05/25/2019 47.4  39.0 - 52.0 % Final  . MCV 05/25/2019 95.8  80.0 - 100.0 fL Final  . MCH 05/25/2019 31.5  26.0 - 34.0 pg Final  . MCHC 05/25/2019 32.9  30.0 - 36.0 g/dL Final  . RDW 05/25/2019 13.9  11.5 - 15.5 % Final  . Platelets 05/25/2019 279  150 - 400 K/uL Final  . nRBC 05/25/2019 0.0  0.0 - 0.2 % Final  . Neutrophils Relative % 05/25/2019 67  % Final  . Neutro Abs 05/25/2019 16.5* 1.7 - 7.7 K/uL Final  . Lymphocytes Relative 05/25/2019 16  % Final  . Lymphs Abs 05/25/2019 3.8  0.7 - 4.0 K/uL Final  . Monocytes Relative 05/25/2019 9  % Final  . Monocytes Absolute 05/25/2019 2.1* 0.1 - 1.0 K/uL Final  . Eosinophils Relative 05/25/2019 1  % Final  . Eosinophils Absolute 05/25/2019 0.2  0.0 - 0.5 K/uL Final  . Basophils Relative 05/25/2019 1  % Final  . Basophils Absolute 05/25/2019 0.2* 0.0 - 0.1 K/uL Final  .  Immature Granulocytes 05/25/2019 6  % Final  . Abs Immature Granulocytes 05/25/2019 1.51* 0.00 - 0.07 K/uL Final   Performed at [facility], [address], [place], [place] [address]    Assessment:  ZAKARY KIMURA is a 64 y.o. 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  WBC has ranged between 13,400 - 25,700 without trend. He has Crohn's disease and a history of sinusitis.  He smokes.  Work-up on 11/09/2017 revealed a WBC 20,100, ANC 17,100, hemoglobin 15.7, hematocrit 47.1, and platelets 251,000.  Normal studies included:  BCR-ABL and JAK2 V617F.  Flow cytometry revealed neutrophilia with slightly left-shifted maturation and monocytosis with rare phenotypic aberrancy.  Peripheral smear was unremarkable. CRP was 4.77 (elevated).   Bone marrow biopsy on 04/05/2018 revealed a normocellular bone marrow for age with trilineage hematopoiesis. Peripheral blood showed leukocytosis with left shifted neutrophilia and monocytosis. Cytogenetics was normal (46, XY).   Monocyte count has ranged between 1400 - 3200 since 06/30/2016.  Monocytes have been 7-16% of WBCs.  He has a history of pulmonary embolism in 10/2017.  He is on Eliquis.  Symptomatically, he feels "the same".  He denies any B symptoms.  He bruises on Eliquis.  Plan: 1.   Labs today: CBC with diff, sed rate. Review work up. 2.   Leukocytosis             WBC 24,400 with an ANC of 16,500.  Monocyte count 2100.           Etiology has been felt reactive secondary to infections, Crohn's disease, and smoking.  Patient remains asymptomatic.  Continue to monitor. 3.   Monocytosis             Patient has had a monocytosis ranging between 1400 - 3200 (7-16% of WBCs).             Review prior concern for CMML concern, but not consistently > 10% of WBC.   Discuss no plan for treatment even if CMML diagnosed as he is asymptomatic.             Etiology possibly secondary to inflammatory bowl disease.              Differential also includes infections, CML/AML (r/o by marrow and negative BCR-ABL), Hodgkin's, and steroids.   Discuss repeating flow cytometry at next visit given prior phenotypic abnormality.        4.   RTC in 3 months for MD assessment and labs (CBC with diff, CMP, LDH, uric acid, flow cytometry).  I discussed the assessment and treatment plan with the patient.  The patient was provided an opportunity to ask questions and all were answered.  The patient agreed with the plan and demonstrated an understanding of the instructions.  The patient was advised to call back if the symptoms worsen or if the condition fails to improve as anticipated.   Lequita Asal, MD, PhD    05/26/2019, 12:19 PM  I, Selena Batten, am acting as scribe for [employer]. Mike Gip, MD, PhD.  I, Decarlo Rivet C. Mike Gip, MD, have reviewed the above documentation for accuracy and completeness, and I agree with the above.

## 2019-05-25 ENCOUNTER — Inpatient Hospital Stay: Payer: Medicare PPO | Attending: Hematology and Oncology

## 2019-05-25 ENCOUNTER — Encounter: Payer: Self-pay | Admitting: Hematology and Oncology

## 2019-05-25 DIAGNOSIS — Z7901 Long term (current) use of anticoagulants: Secondary | ICD-10-CM | POA: Diagnosis not present

## 2019-05-25 DIAGNOSIS — I10 Essential (primary) hypertension: Secondary | ICD-10-CM | POA: Diagnosis not present

## 2019-05-25 DIAGNOSIS — K5090 Crohn's disease, unspecified, without complications: Secondary | ICD-10-CM | POA: Insufficient documentation

## 2019-05-25 DIAGNOSIS — Z7952 Long term (current) use of systemic steroids: Secondary | ICD-10-CM | POA: Diagnosis not present

## 2019-05-25 DIAGNOSIS — Z86711 Personal history of pulmonary embolism: Secondary | ICD-10-CM | POA: Insufficient documentation

## 2019-05-25 DIAGNOSIS — D72828 Other elevated white blood cell count: Secondary | ICD-10-CM | POA: Diagnosis present

## 2019-05-25 DIAGNOSIS — E876 Hypokalemia: Secondary | ICD-10-CM

## 2019-05-25 DIAGNOSIS — Z79899 Other long term (current) drug therapy: Secondary | ICD-10-CM | POA: Diagnosis not present

## 2019-05-25 DIAGNOSIS — F17210 Nicotine dependence, cigarettes, uncomplicated: Secondary | ICD-10-CM | POA: Insufficient documentation

## 2019-05-25 DIAGNOSIS — D72829 Elevated white blood cell count, unspecified: Secondary | ICD-10-CM

## 2019-05-25 LAB — CBC WITH DIFFERENTIAL/PLATELET
Abs Immature Granulocytes: 1.51 10*3/uL — ABNORMAL HIGH (ref 0.00–0.07)
Basophils Absolute: 0.2 10*3/uL — ABNORMAL HIGH (ref 0.0–0.1)
Basophils Relative: 1 %
Eosinophils Absolute: 0.2 10*3/uL (ref 0.0–0.5)
Eosinophils Relative: 1 %
HCT: 47.4 % (ref 39.0–52.0)
Hemoglobin: 15.6 g/dL (ref 13.0–17.0)
Immature Granulocytes: 6 %
Lymphocytes Relative: 16 %
Lymphs Abs: 3.8 10*3/uL (ref 0.7–4.0)
MCH: 31.5 pg (ref 26.0–34.0)
MCHC: 32.9 g/dL (ref 30.0–36.0)
MCV: 95.8 fL (ref 80.0–100.0)
Monocytes Absolute: 2.1 10*3/uL — ABNORMAL HIGH (ref 0.1–1.0)
Monocytes Relative: 9 %
Neutro Abs: 16.5 10*3/uL — ABNORMAL HIGH (ref 1.7–7.7)
Neutrophils Relative %: 67 %
Platelets: 279 10*3/uL (ref 150–400)
RBC: 4.95 MIL/uL (ref 4.22–5.81)
RDW: 13.9 % (ref 11.5–15.5)
WBC: 24.4 10*3/uL — ABNORMAL HIGH (ref 4.0–10.5)
nRBC: 0 % (ref 0.0–0.2)

## 2019-05-25 LAB — SEDIMENTATION RATE: Sed Rate: 1 mm/hr (ref 0–20)

## 2019-05-25 LAB — POTASSIUM: Potassium: 3.9 mmol/L (ref 3.5–5.1)

## 2019-05-26 ENCOUNTER — Other Ambulatory Visit: Payer: Medicare PPO

## 2019-05-26 ENCOUNTER — Inpatient Hospital Stay (HOSPITAL_BASED_OUTPATIENT_CLINIC_OR_DEPARTMENT_OTHER): Payer: Medicare PPO | Admitting: Hematology and Oncology

## 2019-05-26 ENCOUNTER — Encounter: Payer: Self-pay | Admitting: Hematology and Oncology

## 2019-05-26 DIAGNOSIS — D72829 Elevated white blood cell count, unspecified: Secondary | ICD-10-CM

## 2019-05-26 DIAGNOSIS — D72821 Monocytosis (symptomatic): Secondary | ICD-10-CM | POA: Diagnosis not present

## 2019-10-17 ENCOUNTER — Encounter: Payer: Self-pay | Admitting: Family Medicine

## 2019-12-12 ENCOUNTER — Other Ambulatory Visit: Payer: Self-pay | Admitting: Family Medicine

## 2019-12-12 ENCOUNTER — Ambulatory Visit
Admission: RE | Admit: 2019-12-12 | Discharge: 2019-12-12 | Disposition: A | Discharge status: Home / Self Care | Payer: Medicare PPO | Attending: Family Medicine | Admitting: Family Medicine

## 2019-12-12 ENCOUNTER — Ambulatory Visit
Admission: RE | Admit: 2019-12-12 | Discharge: 2019-12-12 | Disposition: A | Discharge status: Home / Self Care | Payer: Medicare PPO | Source: Ambulatory Visit | Attending: Family Medicine | Admitting: Family Medicine

## 2019-12-12 DIAGNOSIS — S9001XA Contusion of right ankle, initial encounter: Secondary | ICD-10-CM

## 2019-12-29 ENCOUNTER — Encounter: Payer: Medicare PPO | Attending: Physician Assistant | Admitting: Physician Assistant

## 2019-12-29 DIAGNOSIS — S81802A Unspecified open wound, left lower leg, initial encounter: Secondary | ICD-10-CM | POA: Diagnosis not present

## 2019-12-29 DIAGNOSIS — S81801A Unspecified open wound, right lower leg, initial encounter: Secondary | ICD-10-CM | POA: Diagnosis not present

## 2019-12-29 DIAGNOSIS — Z7901 Long term (current) use of anticoagulants: Secondary | ICD-10-CM | POA: Diagnosis not present

## 2019-12-29 DIAGNOSIS — T24231A Burn of second degree of right lower leg, initial encounter: Secondary | ICD-10-CM | POA: Insufficient documentation

## 2019-12-29 DIAGNOSIS — S80212A Abrasion, left knee, initial encounter: Secondary | ICD-10-CM | POA: Insufficient documentation

## 2019-12-29 DIAGNOSIS — W19XXXA Unspecified fall, initial encounter: Secondary | ICD-10-CM | POA: Insufficient documentation

## 2019-12-29 DIAGNOSIS — M6281 Muscle weakness (generalized): Secondary | ICD-10-CM | POA: Insufficient documentation

## 2019-12-30 ENCOUNTER — Other Ambulatory Visit
Admission: RE | Admit: 2019-12-30 | Discharge: 2019-12-30 | Disposition: A | Discharge status: Home / Self Care | Payer: Medicare PPO | Source: Ambulatory Visit | Attending: Physician Assistant | Admitting: Physician Assistant

## 2019-12-30 DIAGNOSIS — B999 Unspecified infectious disease: Secondary | ICD-10-CM | POA: Insufficient documentation

## 2019-12-30 NOTE — Progress Notes (Signed)
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 Denies complaints or symptoms of Hives, Itching. Integumentary (Skin) Complains or has symptoms of Wounds, Bleeding or bruising tendency, Breakdown, Swelling. Musculoskeletal Stockburger, MARGUES FILIPPINI. ([id number]) Denies complaints or symptoms of Muscle Pain, Muscle Weakness. Neurologic Complains or has symptoms of Numbness/parasthesias. Denies complaints or symptoms of Focal/Weakness. Psychiatric Denies complaints or symptoms of Anxiety, Claustrophobia. Objective Constitutional sitting or standing blood pressure is within target range for patient.. pulse regular and within target range for patient.Marland Kitchen respirations regular, non- labored and within target range for patient.Marland Kitchen temperature within target range for patient.. Well-nourished and well-hydrated in no acute distress. Vitals Time Taken: 9:50 AM, Height: 70 in, Source: Stated, Weight: 180 lbs, Source: Stated, BMI: 25.8, Temperature: 99.3 F, Pulse: 110 bpm, Respiratory Rate: 22 breaths/min, Blood Pressure: 133/77 mmHg. Eyes conjunctiva clear no eyelid edema noted. pupils equal round and reactive to light and accommodation. Ears, Nose, Mouth, and  Throat no gross abnormality of ear auricles or external auditory canals. normal hearing noted during conversation. mucus membranes moist. Respiratory normal breathing without difficulty. Cardiovascular 2+ pitting edema of the bilateral lower extremities. Musculoskeletal normal gait and posture. no significant deformity or arthritic changes, no loss or range of motion, no clubbing. Psychiatric this patient is  able to make decisions and demonstrates good insight into disease process. Alert and Oriented x 3. pleasant and cooperative. General Notes: Upon inspection patient's wound bed actually showed signs at this time of having various stages of granulation, epithelization, and slough buildup on the surface of the wounds. With that being said there are definitely some areas that really would benefit from sharp debridement likely but unfortunately he is having pain to the degree that I do not think that is can be possible. With that being said I did actually obtain a wound culture from the right lower leg where he did have some erythema in order to evaluate for any signs of infection here. Obviously I think that visually this may definitely be an issue. Integumentary (Hair, Skin) Wound #1 status is Open. Original cause of wound was Trauma. The wound is located on the Left,Anterior Knee. The wound measures 7.5cm length x 6.5cm width x 0.1cm depth; 38.288cm^2 area and 3.829cm^3 volume. There is Fat Layer (Subcutaneous Tissue) exposed. There is a large amount of serosanguineous drainage noted. The wound margin is flat and intact. There is small (1-33%) granulation within the wound bed. There is a large (67-100%) amount of necrotic tissue within the wound bed including Adherent Slough. Wound #2 status is Open. Original cause of wound was Trauma. The wound is located on the [facility] Lower Leg. The wound measures 7.5cm length x 8cm width x 0.1cm depth; 47.124cm^2 area and 4.712cm^3 volume.  There is Fat Layer (Subcutaneous Tissue) exposed. There is a large amount of serosanguineous drainage noted. Foul odor after cleansing was noted. The wound margin is distinct with the outline attached to the wound base. There is small (1-33%) pink granulation within the wound bed. There is a large (67-100%) amount of necrotic tissue within the wound bed including Adherent Slough. Wound #3 status is Open. Original cause of wound was Gradually Appeared. The wound is located on the Right,Distal,Anterior Lower Leg. The wound measures 4.5cm length x 5.5cm width x 0.1cm depth; 19.439cm^2 area and 1.944cm^3 volume. There is Fat Layer (Subcutaneous Tissue) exposed. There is no tunneling or undermining noted. There is a large amount of serosanguineous drainage noted. Foul odor after cleansing was noted. The wound margin is flat and intact. There is small (1-33%) pink granulation within the wound bed. There is a large (67- 100%) amount of necrotic tissue within the wound bed including Adherent Slough. Wound #4 status is Open. Original cause of wound was Thermal Burn. The wound is located on the Right,Lateral Lower Leg. The wound measures 7cm length x 6.5cm width x 0.1cm depth; 35.736cm^2 area and 3.574cm^3 volume. There is Fat Layer (Subcutaneous Tissue) exposed. There is no tunneling or undermining noted. There is a large amount of serosanguineous drainage noted. Foul odor after cleansing was noted. The wound margin is flat and intact. There is small (1-33%) pink granulation within the wound bed. There is a large (67-100%) amount of necrotic tissue within the wound bed including Adherent Slough. Wound #5 status is Open. Original cause of wound was Trauma. The wound is located on the Right,Posterior Lower Leg. The wound measures 2cm length x 2cm width x 0.1cm depth; 3.142cm^2 area and 0.314cm^3 volume. There is Fat Layer (Subcutaneous Tissue) exposed. There is no undermining noted. There is a medium amount of  serosanguineous drainage noted. The wound margin is distinct with the outline attached to the wound base. There is small (1-33%) pink granulation within the wound bed. There is a large (67-100%) amount of necrotic tissue  within the FISHEL, WAMBLE ([id number]) wound bed including Adherent Slough. Assessment Active Problems ICD-10 Unspecified open wound, left knee, initial encounter Venous insufficiency (chronic) (peripheral) Non-pressure chronic ulcer of other part of left lower leg with fat layer exposed Non-pressure chronic ulcer of other part of right lower leg with fat layer exposed Burn of second degree of right lower leg, initial encounter Muscle weakness (generalized) Long term (current) use of anticoagulants Procedures Wound #1 Pre-procedure diagnosis of Wound #1 is a Trauma, Other located on the Left,Anterior Knee . There was a Chemical/Enzymatic/Mechanical debridement performed by STONE III, Dru Primeau E., PA-C. With the following instrument(s): saline and gauze. Other agent used was saline and gauze. A time out was conducted at 11:04, prior to the start of the procedure. A Minimum amount of bleeding was controlled with Pressure. The procedure was tolerated well. Post Debridement Measurements: 7.5cm length x 6.5cm width x 0.1cm depth; 3.829cm^3 volume. Character of Wound/Ulcer Post Debridement is stable. Post procedure Diagnosis Wound #1: Same as Pre-Procedure Wound #2 Pre-procedure diagnosis of Wound #2 is a Venous Leg Ulcer located on the Left,Distal,Anterior Lower Leg .Severity of Tissue Pre Debridement is: Fat layer exposed. There was a Chemical/Enzymatic/Mechanical debridement performed by STONE III, Fielding Mault E., PA-C. With the following instrument(s): saline and gauze. Other agent used was saline and gauze. A time out was conducted at 11:04, prior to the start of the procedure. A Minimum amount of bleeding was controlled with Pressure. The procedure was tolerated well. Post  Debridement Measurements: 7.5cm length x 8cm width x 0.1cm depth; 4.712cm^3 volume. Character of Wound/Ulcer Post Debridement is stable. Severity of Tissue Post Debridement is: Fat layer exposed. Post procedure Diagnosis Wound #2: Same as Pre-Procedure Wound #3 Pre-procedure diagnosis of Wound #3 is a Venous Leg Ulcer located on the Right,Distal,Anterior Lower Leg .Severity of Tissue Pre Debridement is: Fat layer exposed. There was a Chemical/Enzymatic/Mechanical debridement performed by STONE III, Cesia Orf E., PA-C. With the following instrument(s): saline and gauze. Other agent used was saline and gauze. A time out was conducted at 11:04, prior to the start of the procedure. A Minimum amount of bleeding was controlled with Pressure. The procedure was tolerated well. Post Debridement Measurements: 4.5cm length x 5.5cm width x 0.1cm depth; 1.944cm^3 volume. Character of Wound/Ulcer Post Debridement is stable. Severity of Tissue Post Debridement is: Fat layer exposed. Post procedure Diagnosis Wound #3: Same as Pre-Procedure Wound #4 Pre-procedure diagnosis of Wound #4 is a 2nd degree Burn located on the Right,Lateral Lower Leg . An Burn Debridement: Small procedure was performed by STONE III, Gloria Lambertson E., PA-C. Post procedure Diagnosis Wound #4: Same as Pre-Procedure Notes: Patient Name: KELCY, BAETEN Medical Record Number: [id number] Date of Birth/Sex: [birth year] (64 y.o. Male) Primary Care Provider: Lavera Guise Referring Provider: Sallee Lange in Treatment: 0 Date of Service: 12/29/2019 9:15 AM Patient Account Number: [id number] Treating RN: Cornell Barman Other Clinician: Treating Provider/Extender: STONE III, Gabriellah Rabel Debridement Performed for Assessment: Wound #4 Right,Lateral Lower Leg Performed By: Physician STONE III, Makarios Madlock E., PA-C Debridement Type: Chemical/Enzymatic/Mechanical Agent Used: saline and gauze Level of Consciousness (Pre-procedure): Awake and Alert Pre-procedure Verification/Time Out  Taken: Yes - 11:04 Instrument: Other : saline and gauze Bleeding: Minimum Hemostasis Achieved: Pressure Response to Treatment: Procedure was tolerated well Level of Consciousness (Post-procedure): Awake and Alert Post Debridement Measurements of Total Wound Length: (cm) 7 Width: (cm) 6.5 Depth: (cm) 0.1 Volume: (cm) 3.574 Character of Wound/Ulcer Post Debridement: Stable Post Procedure Diagnosis Same as Pre-procedure Wound #5 Pre-procedure diagnosis  of Wound #5 is a 2nd degree Burn located on the Right,Posterior Lower Leg . An Burn Debridement: Small procedure was performed by STONE III, Dyshon Philbin E., PA-C. Post procedure Diagnosis Wound #5: Same as Pre-Procedure Notes: Debridement Details Patient Name: DEMARLO, RIOJAS Medical Record Number: [id number] Date of Birth/Sex: Apr 15, 1956 (64 y.o. Male) Primary Care Provider: Lavera Guise Referring Provider: Sallee Lange in Treatment: 0 Date of Service: 12/29/2019 9:15 AM Patient Account Number: [id number] Treating RN: Cornell Barman Other Clinician: Treating Provider/Extender: STONE III, Rebacca Votaw Debridement Performed for Assessment: Wound #5 Right,Posterior Lower Leg Performed By: Physician STONE III, Creedence Kunesh E., PA-C Debridement Type: Chemical/Enzymatic/Mechanical Agent Used: saline and gauze Level of Consciousness (Pre-procedure): Awake and Alert Pre-procedure Verification/Time Out Taken: Yes - 11:04 Instrument: Other : saline and gauze Bleeding: Minimum Hemostasis Achieved: Pressure Response to BRAELIN, COSTLOW. ([id number]) Treatment: Procedure was tolerated well Level of Consciousness (Post-procedure): Awake and Alert Post Debridement Measurements of Total Wound Length: (cm) 2 Width: (cm) 2 Depth: (cm) 0.1 Volume: (cm) 0.314 Character of Wound/Ulcer Post Debridement: Stable Post Procedure Diagnosis Same as Pre-procedure Plan Wound Cleansing: Wound #1 Left,Anterior Knee: Clean wound with Normal Saline. Dial antibacterial soap, wash wounds, rinse and pat dry  prior to dressing wounds Wound #2 Left,Distal,Anterior Lower Leg: Clean wound with Normal Saline. Dial antibacterial soap, wash wounds, rinse and pat dry prior to dressing wounds Wound #3 Right,Distal,Anterior Lower Leg: Clean wound with Normal Saline. Dial antibacterial soap, wash wounds, rinse and pat dry prior to dressing wounds Wound #4 Right,Lateral Lower Leg: Clean wound with Normal Saline. Dial antibacterial soap, wash wounds, rinse and pat dry prior to dressing wounds Wound #5 Right,Posterior Lower Leg: Clean wound with Normal Saline. Dial antibacterial soap, wash wounds, rinse and pat dry prior to dressing wounds Anesthetic (add to Medication List): Wound #1 Left,Anterior Knee: Topical Lidocaine 4% cream applied to wound bed prior to debridement (In Clinic Only). Wound #2 Left,Distal,Anterior Lower Leg: Topical Lidocaine 4% cream applied to wound bed prior to debridement (In Clinic Only). Wound #3 Right,Distal,Anterior Lower Leg: Topical Lidocaine 4% cream applied to wound bed prior to debridement (In Clinic Only). Wound #4 Right,Lateral Lower Leg: Topical Lidocaine 4% cream applied to wound bed prior to debridement (In Clinic Only). Wound #5 Right,Posterior Lower Leg: Topical Lidocaine 4% cream applied to wound bed prior to debridement (In Clinic Only). Skin Barriers/Peri-Wound Care: Barrier cream - Desitin-around draining areas Primary Wound Dressing: Wound #1 Left,Anterior Knee: Xeroform Wound #2 Left,Distal,Anterior Lower Leg: Mepitel One Contact layer - 1 Silver Alginate - 2 Wound #3 Right,Distal,Anterior Lower Leg: Mepitel One Contact layer - 1 Silver Alginate - 2 Wound #4 Right,Lateral Lower Leg: Mepitel One Contact layer - 1 Silver Alginate - 2 Wound #5 Right,Posterior Lower Leg: Mepitel One Contact layer - 1 Silver Alginate - 2 Secondary Dressing: Wound #2 Left,Distal,Anterior Lower Leg: Conform/Kerlix XtraSorb - 3 Wound #3 Right,Distal,Anterior Lower  Leg: Conform/Kerlix XtraSorb - 3 Wound #4 Right,Lateral Lower Leg: Conform/Kerlix XtraSorb - 3 Wound #5 Right,Posterior Lower Leg: Conform/Kerlix XtraSorb - 3 Dressing Change Frequency: Wound #1 Left,Anterior Knee: Change dressing every other day. - more often as needed due to wetness. Wound #2 Left,Distal,Anterior Lower Leg: Change dressing every other day. - more often as needed due to wetness. Wound #3 Right,Distal,Anterior Lower Leg: Change dressing every other day. - more often as needed due to wetness. Wound #4 Right,Lateral Lower Leg: Change dressing every other day. - more often as needed due to wetness. Wound #5 Right,Posterior Lower Leg: Change  dressing every other day. - more often as needed due to wetness. TYESON, TANIMOTO ([id number]) Follow-up Appointments: Wound #1 Left,Anterior Knee: Return Appointment in 1 week. Nurse Visit as needed Wound #2 Left,Distal,Anterior Lower Leg: Return Appointment in 1 week. Nurse Visit as needed Wound #3 Right,Distal,Anterior Lower Leg: Return Appointment in 1 week. Nurse Visit as needed Wound #4 Right,Lateral Lower Leg: Return Appointment in 1 week. Nurse Visit as needed Wound #5 Right,Posterior Lower Leg: Return Appointment in 1 week. Nurse Visit as needed Edema Control: Wound #1 Left,Anterior Knee: Elevate legs to the level of the heart and pump ankles as often as possible Other: - Tubi-grip F Wound #2 Left,Distal,Anterior Lower Leg: Elevate legs to the level of the heart and pump ankles as often as possible Other: - Tubi-grip F Wound #3 Right,Distal,Anterior Lower Leg: Elevate legs to the level of the heart and pump ankles as often as possible Other: - Tubi-grip F Wound #4 Right,Lateral Lower Leg: Elevate legs to the level of the heart and pump ankles as often as possible Other: - Tubi-grip F Wound #5 Right,Posterior Lower Leg: Elevate legs to the level of the heart and pump ankles as often as possible Other: -  Tubi-grip F Medications-please add to medication list.: Wound #1 Left,Anterior Knee: P.O. Antibiotics - CIpro Wound #2 Left,Distal,Anterior Lower Leg: P.O. Antibiotics - CIpro Wound #3 Right,Distal,Anterior Lower Leg: P.O. Antibiotics - CIpro Wound #4 Right,Lateral Lower Leg: P.O. Antibiotics - CIpro Wound #5 Right,Posterior Lower Leg: P.O. Antibiotics - CIpro Laboratory ordered were: Wound culture routine - Right lower leg The following medication(s) was prescribed: Cipro oral 500 mg tablet 1 1 tablet oral taken 2 times per day for 14 days starting 12/29/2019 1. I would recommend currently that we go ahead and initiate treatment with a course of antibiotics and again the patient is allergic to aspirin, amlodipine, and Flagyl. With that being said I am going to go ahead and initiate treatment with Cipro at this point which I think will be a good option he also tells me he is done well with this in the past. 2. We will also go ahead and from a dressing standpoint initiate silver alginate to all wounds except for the left knee we will use Xeroform. The patient will be using a Mepitel contact layer underneath the alginate's. 3. We will also use Xtrasorb over top of this and subsequently Tubigrip for the time being although we may consider a Unna boot wrap in the future right now I really do not want to do that until we get the infection under control this way the patient can change the dressings at home. 4. I do recommend that he needs to elevate his legs much as possible try to keep edema under good control. We will see patient back for reevaluation in 1 week here in the clinic. If anything worsens or changes patient will contact our office for additional recommendations. Electronic Signature(s) Signed: 12/29/2019 5:26:21 PM By: Worthy Keeler PA-C Entered By: Worthy Keeler on 12/29/2019 17:26:21 Adetokunbo, Mccadden Sheryle Hail  ([id number]) -------------------------------------------------------------------------------- ROS/PFSH Details Patient Name: Brent Diaz Date of Service: 12/29/2019 9:15 AM Medical Record Number: [id number] Patient Account Number: [id number] Date of Birth/Sex: Nov 14, 1955 (64 y.o. Male) Treating RN: Cornell Barman Primary Care Provider: Lavera Guise Other Clinician: Referring Provider: Lavera Guise Treating Provider/Extender: Melburn Hake, Pennie Vanblarcom Weeks in Treatment: 0 Information Obtained From Patient Constitutional Symptoms (General Health) Complaints and Symptoms: Negative for: Fatigue; Fever; Chills; Marked Weight Change Eyes Complaints and Symptoms:  Negative for: Dry Eyes; Vision Changes; Glasses / Contacts Ear/Nose/Mouth/Throat Complaints and Symptoms: Negative for: Difficult clearing ears; Sinusitis Hematologic/Lymphatic Complaints and Symptoms: Negative for: Bleeding / Clotting Disorders; Human Immunodeficiency Virus Respiratory Complaints and Symptoms: Negative for: Chronic or frequent coughs; Shortness of Breath Cardiovascular Complaints and Symptoms: Negative for: Chest pain; LE edema Gastrointestinal Complaints and Symptoms: Positive for: Frequent diarrhea - R/T Chrones Negative for: Nausea; Vomiting Medical History: Negative for: Cirrhosis ; Colitis; Crohnos; Hepatitis A; Hepatitis B; Hepatitis C Endocrine Complaints and Symptoms: Negative for: Hepatitis; Thyroid disease; Polydypsia (Excessive Thirst) Genitourinary Complaints and Symptoms: Negative for: Kidney failure/ Dialysis; Incontinence/dribbling Immunological Complaints and Symptoms: Negative for: Hives; Itching Integumentary (Skin) Dismuke, Babyboy E. ([id number]) Complaints and Symptoms: Positive for: Wounds; Bleeding or bruising tendency; Breakdown; Swelling Medical History: Negative for: History of pressure wounds Musculoskeletal Complaints and Symptoms: Negative for: Muscle Pain; Muscle  Weakness Medical History: Negative for: Gout; Rheumatoid Arthritis; Osteoarthritis; Osteomyelitis Neurologic Complaints and Symptoms: Positive for: Numbness/parasthesias Negative for: Focal/Weakness Medical History: Positive for: Neuropathy Negative for: Dementia; Quadriplegia; Paraplegia; Seizure Disorder Psychiatric Complaints and Symptoms: Negative for: Anxiety; Claustrophobia Oncologic Immunizations Pneumococcal Vaccine: Received Pneumococcal Vaccination: Yes Implantable Devices None Family and Social History Cancer: No; Diabetes: No; Heart Disease: No; Hereditary Spherocytosis: No; Hypertension: Yes - Father,Siblings; Kidney Disease: No; Lung Disease: No; Seizures: No; Stroke: No; Thyroid Problems: No; Tuberculosis: No; Current every day smoker; Marital Status - Married; Alcohol Use: Never; Drug Use: No History; Caffeine Use: Daily; Financial Concerns: No; Food, Clothing or Shelter Needs: No; Support System Lacking: No; Transportation Concerns: No Electronic Signature(s) Signed: 12/29/2019 12:28:02 PM By: Darci Needle Signed: 12/29/2019 5:28:41 PM By: Worthy Keeler PA-C Signed: 12/29/2019 5:40:22 PM By: Gretta Cool, BSN, RN, CWS, Kim RN, BSN Entered By: Darci Needle on 12/29/2019 10:54:01 [name], Sheryle Hail ([id number]) -------------------------------------------------------------------------------- SuperBill Details Patient Name: Brent Diaz Date of Service: 12/29/2019 Medical Record Number: [id number] Patient Account Number: [id number] Date of Birth/Sex: [birth year] (64 y.o. Male) Treating RN: Cornell Barman Primary Care Provider: Lavera Guise Other Clinician: Referring Provider: Lavera Guise Treating Provider/Extender: Melburn Hake, Creola Krotz Weeks in Treatment: 0 Diagnosis Coding ICD-10 Codes Code Description S81.002A Unspecified open wound, left knee, initial encounter I87.2 Venous insufficiency (chronic) (peripheral) L97.822 Non-pressure chronic ulcer of other part of left  lower leg with fat layer exposed L97.812 Non-pressure chronic ulcer of other part of right lower leg with fat layer exposed T24.231A Burn of second degree of right lower leg, initial encounter M62.81 Muscle weakness (generalized) Z79.01 Long term (current) use of anticoagulants Facility Procedures CPT4 Code: 38101751 Description: 02585 - WOUND CARE VISIT-LEV 5 EST PT Modifier: Quantity: 1 CPT4 Code: 27782423 Description: 16020 - BURN DRSG W/O ANESTH-SM Modifier: Quantity: 2 CPT4 Code: Description: ICD-10 Diagnosis Description T24.231A Burn of second degree of right lower leg, initial encounter Modifier: Quantity: Physician Procedures CPT4 Code: 5361443 Description: 99204 - WC PHYS LEVEL 4 - NEW PT Modifier: 25 Quantity: 1 CPT4 Code: Description: ICD-10 Diagnosis Description S81.002A Unspecified open wound, left knee, initial encounter I87.2 Venous insufficiency (chronic) (peripheral) L97.822 Non-pressure chronic ulcer of other part of left lower leg with fat layer e L97.812  Non-pressure chronic ulcer of other part of right lower leg with fat layer Modifier: xposed exposed Quantity: CPT4 Code: 1540086 Description: 16020 - WC PHYS DRESS/DEBRID SM,<5% TOT BODY SURF Modifier: Quantity: 2 CPT4 Code: Description: ICD-10 Diagnosis Description T24.231A Burn of second degree of right lower leg, initial encounter Modifier: Quantity: Electronic Signature(s) Signed: 12/29/2019 5:31:26 PM By: Gretta Cool, BSN, RN, CWS, Kim  RN, BSN Previous Signature: 12/29/2019 5:27:36 PM Version By: Worthy Keeler PA-C Entered By: Gretta Cool, BSN, RN, CWS, Kim on 12/29/2019 17:31:26

## 2019-12-30 NOTE — Progress Notes (Signed)
OCIEL, RETHERFORD ([id number]) Visit Report for 12/29/2019 Abuse/Suicide Risk Screen Details Patient Name: Brent Diaz, Brent Diaz Date of Service: 12/29/2019 9:15 AM Medical Record Number: [id number] Patient Account Number: [id number] Date of Birth/Sex: 1955/07/04 (64 y.o. Male) Treating RN: Cornell Barman Primary Care Sage Kopera: Lavera Guise Other Clinician: Referring Starsky Nanna: Lavera Guise Treating Loudon Krakow/Extender: STONE III, HOYT Weeks in Treatment: 0 Abuse/Suicide Risk Screen Items Answer ABUSE RISK SCREEN: Has anyone close to you tried to hurt or harm you recentlyo No Do you feel uncomfortable with anyone in your familyo No Has anyone forced you do things that you didnot want to doo No Electronic Signature(s) Signed: 12/29/2019 12:28:02 PM By: Darci Needle Signed: 12/29/2019 5:40:22 PM By: Gretta Cool, BSN, RN, CWS, Kim RN, BSN Entered By: Darci Needle on 12/29/2019 10:54:17 Plessinger, Sheryle Hail ([id number]) -------------------------------------------------------------------------------- Activities of Daily Living Details Patient Name: Brent Diaz Date of Service: 12/29/2019 9:15 AM Medical Record Number: [id number] Patient Account Number: [id number] Date of Birth/Sex: [birth year] (64 y.o. Male) Treating RN: Cornell Barman Primary Care Kailyn Dubie: Lavera Guise Other Clinician: Referring Ollie Delano: Lavera Guise Treating Neftali Thurow/Extender: Melburn Hake, HOYT Weeks in Treatment: 0 Activities of Daily Living Items Answer Activities of Daily Living (Please select one for each item) Drive Automobile Completely Able Take Medications Completely Able Use Telephone Completely Able Care for Appearance Completely Able Use Toilet Completely Able Bath / Shower Completely Able Dress Self Completely Able Feed Self Not Able Walk Completely Able Get In / Out Bed Completely [place] Need Assistance Shop for Self Completely Able Electronic  Signature(s) Signed: 12/29/2019 12:28:02 PM By: Darci Needle Signed: 12/29/2019 5:40:22 PM By: Gretta Cool, BSN, RN, CWS, Kim RN, BSN Entered By: Darci Needle on 12/29/2019 10:55:09 Brent Diaz ([id number]) -------------------------------------------------------------------------------- Education Screening Details Patient Name: Brent Diaz Date of Service: 12/29/2019 9:15 AM Medical Record Number: [id number] Patient Account Number: [id number] Date of Birth/Sex: 13-Dec-1955 (64 y.o. Male) Treating RN: Cornell Barman Primary Care Ramon Zanders: Lavera Guise Other Clinician: Referring Saber Dickerman: Lavera Guise Treating Ines Rebel/Extender: Worthy Keeler Weeks in Treatment: 0 Learning Preferences/Education Level/Primary Language Learning Preference: Explanation Highest Education Level: College or Above Preferred Language: English Cognitive Barrier Language Barrier: No Translator Needed: No Memory Deficit: No Emotional Barrier: No Cultural/Religious Beliefs Affecting Medical Care: No Physical Barrier Impaired Vision: No Decreased Hand dexterity: No Knowledge/Comprehension Knowledge Level: High Comprehension Level: High Ability to understand written instructions: High Ability to understand verbal instructions: High Motivation Anxiety Level: Anxious Cooperation: Cooperative Education Importance: Acknowledges Need Interest in Health Problems: Asks Questions Perception: Coherent Willingness to Engage in Self-Management High Activities: Readiness to Engage in Self-Management High Activities: Electronic Signature(s) Signed: 12/29/2019 12:28:02 PM By: Darci Needle Signed: 12/29/2019 5:40:22 PM By: Gretta Cool, BSN, RN, CWS, Kim RN, BSN Entered By: Darci Needle on 12/29/2019 10:55:56 Gladson, Sheryle Hail ([id number]) -------------------------------------------------------------------------------- Fall Risk Assessment Details Patient Name: Brent Diaz Date of Service: 12/29/2019 9:15  AM Medical Record Number: [id number] Patient Account Number: [id number] Date of Birth/Sex: 11/24/1955 (64 y.o. Male) Treating RN: Cornell Barman Primary Care Mclain Freer: Lavera Guise Other Clinician: Referring Halo Shevlin: Lavera Guise Treating Shrihaan Porzio/Extender: Melburn Hake, HOYT Weeks in Treatment: 0 Fall Risk Assessment Items Have you had 2 or more falls in the last 12 monthso 0 Yes Have you had any fall that resulted in injury in the last 12 monthso 0 Yes FALLS RISK SCREEN History of falling - immediate or within 3 months 25 Yes Secondary diagnosis (Do you have 2 or more medical diagnoseso) 15  Yes Ambulatory aid None/bed rest/wheelchair/nurse 0 No Crutches/cane/walker 0 No Furniture 0 No Intravenous therapy Access/Saline/Heparin Lock 0 No Gait/Transferring Normal/ bed rest/ wheelchair 0 No Weak (short steps with or without shuffle, stooped but able to lift head while walking, may 0 No seek support from furniture) Impaired (short steps with shuffle, may have difficulty arising from chair, head down, impaired 0 No balance) Mental Status Oriented to own ability 0 Yes Electronic Signature(s) Signed: 12/29/2019 12:28:02 PM By: Darci Needle Signed: 12/29/2019 5:40:22 PM By: Gretta Cool, BSN, RN, CWS, Kim RN, BSN Entered By: Darci Needle on 12/29/2019 10:56:17 Vanzandt, Sheryle Hail ([id number]) -------------------------------------------------------------------------------- Foot Assessment Details Patient Name: Brent Diaz Date of Service: 12/29/2019 9:15 AM Medical Record Number: [id number] Patient Account Number: [id number] Date of Birth/Sex: 1956/02/22 (64 y.o. Male) Treating RN: Cornell Barman Primary Care Schylar Wuebker: Lavera Guise Other Clinician: Referring Charleton Deyoung: Lavera Guise Treating Osmany Azer/Extender: Melburn Hake, HOYT Weeks in Treatment: 0 Foot Assessment Items Site Locations + = Sensation present, - = Sensation absent, C = Callus, U = Ulcer R = Redness, W = Warmth, M = Maceration, PU  = Pre-ulcerative lesion F = Fissure, S = Swelling, D = Dryness Assessment Right: Left: Other Deformity: No No Prior Foot Ulcer: No No Prior Amputation: No No Charcot Joint: No No Ambulatory Status: Gait: Electronic Signature(s) Signed: 12/29/2019 12:28:02 PM By: Darci Needle Signed: 12/29/2019 5:40:22 PM By: Gretta Cool, BSN, RN, CWS, Kim RN, BSN Entered By: Darci Needle on 12/29/2019 10:56:34 Basurto, Sheryle Hail ([id number]) -------------------------------------------------------------------------------- Nutrition Risk Screening Details Patient Name: Brent Diaz Date of Service: 12/29/2019 9:15 AM Medical Record Number: [id number] Patient Account Number: [id number] Date of Birth/Sex: Nov 14, 1955 (64 y.o. Male) Treating RN: Cornell Barman Primary Care Ailin Rochford: Lavera Guise Other Clinician: Referring Arianne Klinge: Lavera Guise Treating Neomi Laidler/Extender: STONE III, HOYT Weeks in Treatment: 0 Height (in): 70 Weight (lbs): 180 Body Mass Index (BMI): 25.8 Nutrition Risk Screening Items Score Screening NUTRITION RISK SCREEN: I have an illness or condition that made me change the kind and/or amount of food I eat 0 No I eat fewer than two meals per day 0 No I eat few fruits and vegetables, or milk products 0 No I have three or more drinks of beer, liquor or wine almost every day 0 No I have tooth or mouth problems that make it hard for me to eat 0 No I don't always have enough money to buy the food I need 0 No I eat alone most of the time 0 No I take three or more different prescribed or over-the-counter drugs a day 0 No Without wanting to, I have lost or gained 10 pounds in the last six months 0 No I am not always physically able to shop, cook and/or feed myself 0 No Nutrition Protocols Good Risk Protocol 0 No interventions needed Moderate Risk Protocol High Risk Proctocol Risk Level: Good Risk Score: 0 Electronic Signature(s) Signed: 12/29/2019 12:28:02 PM By: Darci Needle Signed: 12/29/2019 5:40:22 PM By: Gretta Cool, BSN, RN, CWS, Kim RN, BSN Entered By: Darci Needle on 12/29/2019 10:56:27

## 2019-12-30 NOTE — Progress Notes (Signed)
MALIC, ROSTEN ([id number]) Visit Report for 12/29/2019 Allergy List Details Patient Name: Brent Diaz, Brent Diaz Date of Service: 12/29/2019 9:15 AM Medical Record Number: [id number] Patient Account Number: [id number] Date of Birth/Sex: 07/27/1955 (64 y.o. Male) Treating RN: Cornell Barman Primary Care Ceclia Koker: Lavera Guise Other Clinician: Referring Arnav Cregg: Lavera Guise Treating Maycie Luera/Extender: Melburn Hake, HOYT Weeks in Treatment: 0 Allergies Active Allergies Aspirin, Amlodipine, Flagyl Reaction: ASA (GI bleed) Amlodipine (sweeling) Flagy (neuropathy) Severity: Severe Allergy Notes Electronic Signature(s) Signed: 12/29/2019 12:28:02 PM By: Darci Needle Entered By: Darci Needle on 12/29/2019 10:46:51 Garrow, Sheryle Hail ([id number]) -------------------------------------------------------------------------------- Arrival Information Details Patient Name: Brent Diaz Date of Service: 12/29/2019 9:15 AM Medical Record Number: [id number] Patient Account Number: [id number] Date of Birth/Sex: 14-Jul-1955 (64 y.o. Male) Treating RN: Cornell Barman Primary Care Raisa Ditto: Lavera Guise Other Clinician: Referring Naria Abbey: Lavera Guise Treating Bryann Mcnealy/Extender: Melburn Hake, HOYT Weeks in Treatment: 0 Visit Information Patient Arrived: Crutches Arrival Time: 10:07 Accompanied By: wife Transfer Assistance: None Patient Identification Verified: Yes Secondary Verification Process Completed: Yes Patient Requires Transmission-Based Precautions: No Patient Has Alerts: No Electronic Signature(s) Signed: 12/29/2019 12:28:02 PM By: Darci Needle Entered By: Darci Needle on 12/29/2019 10:08:05 Brent Diaz ([id number]) -------------------------------------------------------------------------------- Clinic Level of Care Assessment Details Patient Name: Brent Diaz Date of Service: 12/29/2019 9:15 AM Medical Record Number: [id number] Patient Account Number: [id number] Date of  Birth/Sex: 1955/10/26 (64 y.o. Male) Treating RN: Cornell Barman Primary Care Luda Charbonneau: Lavera Guise Other Clinician: Referring Alessio Bogan: Lavera Guise Treating Miriam Liles/Extender: Melburn Hake, HOYT Weeks in Treatment: 0 Clinic Level of Care Assessment Items TOOL 2 Quantity Score []  - Use when only an EandM is performed on the INITIAL visit 0 ASSESSMENTS - Nursing Assessment / Reassessment X - General Physical Exam (combine w/ comprehensive assessment (listed just below) when performed on new 1 20 pt. evals) X- 1 25 Comprehensive Assessment (HX, ROS, Risk Assessments, Wounds Hx, etc.) ASSESSMENTS - Wound and Skin Assessment / Reassessment []  - Simple Wound Assessment / Reassessment - one wound 0 X- 5 5 Complex Wound Assessment / Reassessment - multiple wounds []  - 0 Dermatologic / Skin Assessment (not related to wound area) ASSESSMENTS - Ostomy and/or Continence Assessment and Care []  - Incontinence Assessment and Management 0 []  - 0 Ostomy Care Assessment and Management (repouching, etc.) PROCESS - Coordination of Care X - Simple Patient / Family Education for ongoing care 1 15 []  - 0 Complex (extensive) Patient / Family Education for ongoing care X- 1 10 Staff obtains Programmer, systems, Records, Test Results / Process Orders []  - 0 Staff telephones HHA, Nursing Homes / Clarify orders / etc []  - 0 Routine Transfer to another Facility (non-emergent condition) []  - 0 Routine Hospital Admission (non-emergent condition) X- 1 15 New Admissions / Biomedical engineer / Ordering NPWT, Apligraf, etc. []  - 0 Emergency Hospital Admission (emergent condition) X- 1 10 Simple Discharge Coordination []  - 0 Complex (extensive) Discharge Coordination PROCESS - Special Needs []  - Pediatric / Minor Patient Management 0 []  - 0 Isolation Patient Management []  - 0 Hearing / Language / Visual special needs []  - 0 Assessment of Community assistance (transportation, D/C planning, etc.) []  -  0 Additional assistance / Altered mentation []  - 0 Support Surface(s) Assessment (bed, cushion, seat, etc.) INTERVENTIONS - Wound Cleansing / Measurement X - Wound Imaging (photographs - any number of wounds) 1 5 []  - 0 Wound Tracing (instead of photographs) []  - 0 Simple Wound Measurement - one wound X- 5 5 Complex Wound Measurement - multiple wounds  TOREY, REINARD ([id number]) []  - 0 Simple Wound Cleansing - one wound X- 5 5 Complex Wound Cleansing - multiple wounds INTERVENTIONS - Wound Dressings []  - Small Wound Dressing one or multiple wounds 0 []  - 0 Medium Wound Dressing one or multiple wounds X- 3 20 Large Wound Dressing one or multiple wounds []  - 0 Application of Medications - injection INTERVENTIONS - Miscellaneous []  - External ear exam 0 X- 1 5 Specimen Collection (cultures, biopsies, blood, body fluids, etc.) X- 1 5 Specimen(s) / Culture(s) sent or taken to Lab for analysis []  - 0 Patient Transfer (multiple staff / Civil Service fast streamer / Similar devices) []  - 0 Simple Staple / Suture removal (25 or less) []  - 0 Complex Staple / Suture removal (26 or more) []  - 0 Hypo / Hyperglycemic Management (close monitor of Blood Glucose) []  - 0 Ankle / Brachial Index (ABI) - do not check if billed separately Has the patient been seen at the hospital within the last three years: Yes Total Score: 245 Level Of Care: New/Established - Level 5 Electronic Signature(s) Signed: 12/29/2019 5:40:22 PM By: Gretta Cool, BSN, RN, CWS, Kim RN, BSN Entered By: Gretta Cool, BSN, RN, CWS, Kim on 12/29/2019 11:25:07 Brent Diaz ([id number]) -------------------------------------------------------------------------------- Encounter Discharge Information Details Patient Name: Brent Diaz Date of Service: 12/29/2019 9:15 AM Medical Record Number: [id number] Patient Account Number: [id number] Date of Birth/Sex: 23-Oct-1955 (64 y.o. Male) Treating RN: Cornell Barman Primary Care Delma Villalva: Lavera Guise  Other Clinician: Referring Malachi Kinzler: Lavera Guise Treating Lachele Lievanos/Extender: Melburn Hake, HOYT Weeks in Treatment: 0 Encounter Discharge Information Items Discharge Condition: Stable Ambulatory Status: Wheelchair Discharge Destination: Home Transportation: Private Auto Accompanied By: wife Schedule Follow-up Appointment: Yes Clinical Summary of Care: Electronic Signature(s) Signed: 12/29/2019 12:10:18 PM By: Gretta Cool, BSN, RN, CWS, Kim RN, BSN Entered By: Gretta Cool, BSN, RN, CWS, Kim on 12/29/2019 12:10:18 Brent Diaz ([id number]) -------------------------------------------------------------------------------- Lower Extremity Assessment Details Patient Name: Brent Diaz Date of Service: 12/29/2019 9:15 AM Medical Record Number: [id number] Patient Account Number: [id number] Date of Birth/Sex: [birth year] (64 y.o. Male) Treating RN: Cornell Barman Primary Care Jaiel Saraceno: Lavera Guise Other Clinician: Referring Torrance Frech: Lavera Guise Treating Sharronda Schweers/Extender: Melburn Hake, HOYT Weeks in Treatment: 0 Edema Assessment Assessed: [Left: Yes] [Right: Yes] Edema: [Left: Yes] [Right: Yes] Calf Left: Right: Point of Measurement: 33 cm From Medial Instep 38 cm 39 cm Ankle Left: Right: Point of Measurement: 14 cm From Medial Instep 27 cm 27.5 cm Vascular Assessment Pulses: Dorsalis Pedis Doppler Audible: [Left:Yes] [Right:Yes] Posterior Tibial Doppler Audible: [Left:Yes] [Right:Yes] Notes ABI not obtained due to sever pain in both legs. Electronic Signature(s) Signed: 12/29/2019 12:28:02 PM By: Darci Needle Signed: 12/29/2019 5:40:22 PM By: Gretta Cool, BSN, RN, CWS, Kim RN, BSN Entered By: Darci Needle on 12/29/2019 10:45:36 Gilkes, Sheryle Hail ([id number]) -------------------------------------------------------------------------------- Multi Wound Chart Details Patient Name: Brent Diaz Date of Service: 12/29/2019 9:15 AM Medical Record Number: [id number] Patient Account Number:  [id number] Date of Birth/Sex: 03-27-1956 (64 y.o. Male) Treating RN: Cornell Barman Primary Care Katriel Cutsforth: Lavera Guise Other Clinician: Referring Danasha Melman: Lavera Guise Treating Kahmari Herard/Extender: Melburn Hake, HOYT Weeks in Treatment: 0 Vital Signs Height(in): 70 Pulse(bpm): 110 Weight(lbs): 180 Blood Pressure(mmHg): 133/77 Body Mass Index(BMI): 26 Temperature(F): 99.3 Respiratory Rate(breaths/min): 22 Photos: Wound Location: Left, Anterior Knee Left, Distal, Anterior Lower Leg Right, Distal, Anterior Lower Leg Wounding Event: Trauma Trauma Gradually Appeared Primary Etiology: Trauma, Other Trauma, Other Venous Leg Ulcer Comorbid History: Neuropathy N/A Neuropathy Date Acquired: 12/14/2019 09/27/2019 12/14/2019 Weeks of Treatment: 0 0 0  Wound Status: Open Open Open Measurements L x W x D (cm) 7.5x6.5x0.1 7.5x8x0.1 4.5x5.5x0.1 Area (cm) : 38.288 47.124 19.439 Volume (cm) : 3.829 4.712 1.944 % Reduction in Area: 0.00% N/A 0.00% % Reduction in Volume: 0.00% N/A 0.00% Classification: Full Thickness Without Exposed Full Thickness With Exposed Full Thickness Without Exposed Support Structures Support Structures Support Structures Exudate Amount: Large Large Large Exudate Type: Serosanguineous Serosanguineous Serosanguineous Exudate Color: red, brown red, brown red, brown Foul Odor After Cleansing: No Yes Yes Odor Anticipated Due to Product N/A No No Use: Wound Margin: Flat and Intact Distinct, outline attached Flat and Intact Granulation Amount: Small (1-33%) Small (1-33%) Small (1-33%) Granulation Quality: N/A Pink Pink Necrotic Amount: Large (67-100%) Large (67-100%) Large (67-100%) Exposed Structures: Fat Layer (Subcutaneous Tissue): Fat Layer (Subcutaneous Tissue): Fat Layer (Subcutaneous Tissue): Yes Yes Yes Fascia: No Fascia: No Fascia: No Tendon: No Tendon: No Tendon: No Muscle: No Muscle: No Muscle: No Joint: No Joint: No Joint: No Bone: No Bone: No Bone:  No Epithelialization: N/A N/A Small (1-33%) Debridement: Chemical/Enzymatic/Mechanical Chemical/Enzymatic/Mechanical Chemical/Enzymatic/Mechanical Pre-procedure Verification/Time 11:04 11:04 11:04 Out Taken: Instrument: Other(saline and gauze) Other(saline and gauze) Other(saline and gauze) Bleeding: Minimum Minimum Minimum Hemostasis Achieved: Pressure Pressure Pressure Debridement Treatment Procedure was tolerated well Procedure was tolerated well Procedure was tolerated well Response: Post Debridement 7.5x6.5x0.1 7.5x8x0.1 4.5x5.5x0.1 Measurements L x W x D (cm) 3.829 4.712 1.944 Dickman, Matthieu E. ([id number]) Post Debridement Volume: (cm) Procedures Performed: Debridement Debridement Debridement Wound Number: 4 5 N/A Photos: N/A Wound Location: Right, Lateral Lower Leg Right, Posterior Lower Leg N/A Wounding Event: Thermal Burn Trauma N/A Primary Etiology: 2nd degree Burn 2nd degree Burn N/A Comorbid History: Neuropathy Neuropathy N/A Date Acquired: 12/04/2019 12/04/2019 N/A Weeks of Treatment: 0 0 N/A Wound Status: Open Open N/A Measurements L x W x D (cm) 7x6.5x0.1 2x2x0.1 N/A Area (cm) : 35.736 3.142 N/A Volume (cm) : 3.574 0.314 N/A % Reduction in Area: 0.00% 0.00% N/A % Reduction in Volume: 0.00% 0.00% N/A Classification: Full Thickness Without Exposed Full Thickness Without Exposed N/A Support Structures Support Structures Exudate Amount: Large Medium N/A Exudate Type: Serosanguineous Serosanguineous N/A Exudate Color: red, brown red, brown N/A Foul Odor After Cleansing: Yes No N/A Odor Anticipated Due to Product No N/A N/A Use: Wound Margin: Flat and Intact Distinct, outline attached N/A Granulation Amount: Small (1-33%) Small (1-33%) N/A Granulation Quality: Pink Pink N/A Necrotic Amount: Large (67-100%) Large (67-100%) N/A Exposed Structures: Fat Layer (Subcutaneous Tissue): Fat Layer (Subcutaneous Tissue): N/A Yes Yes Fascia: No Fascia: No Tendon:  No Tendon: No Muscle: No Muscle: No Joint: No Joint: No Bone: No Bone: No Epithelialization: Small (1-33%) Small (1-33%) N/A Debridement: N/A N/A N/A Instrument: N/A N/A N/A Bleeding: N/A N/A N/A Hemostasis Achieved: N/A N/A N/A Debridement Treatment N/A N/A N/A Response: Post Debridement N/A N/A N/A Measurements L x W x D (cm) Post Debridement Volume: N/A N/A N/A (cm) Procedures Performed: Burn Debridement: Small Burn Debridement: Small N/A Treatment Notes Wound #1 (Left, Anterior Knee) Notes silvercell, xsorb, kerlix, tubi-grip Wound #2 (Left, Distal, Anterior Lower Leg) Notes silvercell, xsorb, kerlix, tubi-grip Kubota, Morrie E. ([id number]) Wound #3 (Right, Distal, Anterior Lower Leg) Notes silvercell, xsorb, kerlix, tubi-grip Wound #4 (Right, Lateral Lower Leg) Notes silvercell, xsorb, kerlix, tubi-grip Wound #5 (Right, Posterior Lower Leg) Notes silvercell, xsorb, kerlix, tubi-grip Electronic Signature(s) Signed: 12/29/2019 12:35:08 PM By: Gretta Cool, BSN, RN, CWS, Kim RN, BSN Previous Signature: 12/29/2019 12:33:08 PM Version By: Gretta Cool, BSN, RN, CWS, Kim RN, BSN Previous Signature:  12/29/2019 10:56:18 AM Version By: Gretta Cool, BSN, RN, CWS, Kim RN, BSN Entered By: Gretta Cool, BSN, RN, CWS, Kim on 12/29/2019 12:35:08 Brent Diaz ([id number]) -------------------------------------------------------------------------------- Multi-Disciplinary Care Plan Details Patient Name: Brent Diaz Date of Service: 12/29/2019 9:15 AM Medical Record Number: [id number] Patient Account Number: [id number] Date of Birth/Sex: [birth year] (64 y.o. Male) Treating RN: Cornell Barman Primary Care Ayce Pietrzyk: Lavera Guise Other Clinician: Referring Kresha Abelson: Lavera Guise Treating Mizuki Hoel/Extender: Melburn Hake, HOYT Weeks in Treatment: 0 Active Inactive Abuse / Safety / Falls / Self Care Management Nursing Diagnoses: Potential for falls Potential for injury related to  falls Goals: Patient/caregiver will verbalize understanding of skin care regimen Date Initiated: 12/29/2019 Target Resolution Date: 12/29/2019 Goal Status: Active Interventions: Assess self care needs on admission and as needed Notes: Necrotic Tissue Nursing Diagnoses: Impaired tissue integrity related to necrotic/devitalized tissue Goals: Necrotic/devitalized tissue will be minimized in the wound bed Date Initiated: 12/29/2019 Target Resolution Date: 01/29/2020 Goal Status: Active Interventions: Assess patient pain level pre-, during and post procedure and prior to discharge Treatment Activities: Apply topical anesthetic as ordered : 12/29/2019 Notes: Orientation to the Wound Care Program Nursing Diagnoses: Knowledge deficit related to the wound healing center program Goals: Patient/caregiver will verbalize understanding of the [facility] Date Initiated: 12/29/2019 Target Resolution Date: 12/29/2019 Goal Status: Active Interventions: Provide education on orientation to the wound center Notes: Pain, Acute or Chronic Nursing Diagnoses: Pain, acute or chronic: actual or potential Goals: AURELIANO, OSHIELDS ([id number]) Patient will verbalize adequate pain control and receive pain control interventions during procedures as needed Date Initiated: 12/29/2019 Target Resolution Date: 12/29/2019 Goal Status: Active Interventions: Assess comfort goal upon admission Reposition patient for comfort Notes: Venous Leg Ulcer Nursing Diagnoses: Potential for venous Insuffiency (use before diagnosis confirmed) Goals: Non-invasive venous studies are completed as ordered Date Initiated: 12/29/2019 Target Resolution Date: 12/29/2019 Goal Status: Active Interventions: Assess peripheral edema status every visit. Notes: Wound/Skin Impairment Nursing Diagnoses: Impaired tissue integrity Goals: Ulcer/skin breakdown will have a volume reduction of 30% by week 4 Date Initiated:  12/29/2019 Target Resolution Date: 01/29/2020 Goal Status: Active Interventions: Assess patient/caregiver ability to obtain necessary supplies Treatment Activities: Topical wound management initiated : 12/29/2019 Notes: Electronic Signature(s) Signed: 12/29/2019 10:56:00 AM By: Gretta Cool, BSN, RN, CWS, Kim RN, BSN Entered By: Gretta Cool, BSN, RN, CWS, Kim on 12/29/2019 10:56:00 Brent Diaz ([id number]) -------------------------------------------------------------------------------- Pain Assessment Details Patient Name: Brent Diaz Date of Service: 12/29/2019 9:15 AM Medical Record Number: [id number] Patient Account Number: [id number] Date of Birth/Sex: 1955/05/14 (64 y.o. Male) Treating RN: Cornell Barman Primary Care Ashar Lewinski: Lavera Guise Other Clinician: Referring Joesiah Lonon: Lavera Guise Treating Katira Dumais/Extender: STONE III, HOYT Weeks in Treatment: 0 Active Problems Location of Pain Severity and Description of Pain Patient Has Paino Yes Site Locations Pain Location: Pain in Ulcers With Dressing Change: Yes Duration of the Pain. Constant / Intermittento Constant Rate the pain. Current Pain Level: 10 Worst Pain Level: 10 Least Pain Level: 5 Tolerable Pain Level: 5 Character of Pain Describe the Pain: Burning, Stabbing, Tender Pain Management and Medication Current Pain Management: Medication: Yes How does your wound impact your activities of daily livingo Sleep: Yes Notes Tramadol, Tylenol as needed Electronic Signature(s) Signed: 12/29/2019 12:28:02 PM By: Darci Needle Signed: 12/29/2019 5:40:22 PM By: Gretta Cool, BSN, RN, CWS, Kim RN, BSN Entered By: Darci Needle on 12/29/2019 10:10:16 Brent Diaz ([id number]) -------------------------------------------------------------------------------- Patient/Caregiver Education Details Patient Name: Brent Diaz Date of Service: 12/29/2019 9:15 AM Medical Record Number: [id number] Patient Account  Number: [id number] Date  of Birth/Gender: 30-Sep-1955 (64 y.o. Male) Treating RN: Cornell Barman Primary Care Physician: Lavera Guise Other Clinician: Referring Physician: Lavera Guise Treating Physician/Extender: Sharalyn Ink in Treatment: 0 Education Assessment Education Provided To: Patient Education Topics Provided Welcome [facility] [place]: Wound/Skin Impairment: Handouts: Caring for Your Ulcer, Other: WOund care as prescribed Methods: Demonstration, Explain/Verbal Responses: State content correctly Electronic Signature(s) Signed: 12/29/2019 5:40:22 PM By: Gretta Cool, BSN, RN, CWS, Kim RN, BSN Entered By: Gretta Cool, BSN, RN, CWS, Kim on 12/29/2019 11:25:53 Brent Diaz ([id number]) -------------------------------------------------------------------------------- Wound Assessment Details Patient Name: Brent Diaz Date of Service: 12/29/2019 9:15 AM Medical Record Number: [id number] Patient Account Number: [id number] Date of Birth/Sex: [birth year] (64 y.o. Male) Treating RN: Cornell Barman Primary Care Sosha Shepherd: Lavera Guise Other Clinician: Referring Squire Withey: Lavera Guise Treating Nadeen Shipman/Extender: Melburn Hake, HOYT Weeks in Treatment: 0 Wound Status Wound Number: 1 Primary Etiology: Trauma, Other Wound Location: Left, Anterior Knee Wound Status: Open Wounding Event: Trauma Comorbid History: Neuropathy Date Acquired: 12/14/2019 Weeks Of Treatment: 0 Clustered Wound: No Photos Wound Measurements Length: (cm) 7.5 Width: (cm) 6.5 Depth: (cm) 0.1 Area: (cm) 38.288 Volume: (cm) 3.829 % Reduction in Area: 0% % Reduction in Volume: 0% Wound Description Classification: Full Thickness Without Exposed Support Struc Wound Margin: Flat and Intact Exudate Amount: Large Exudate Type: Serosanguineous Exudate Color: red, brown tures Foul Odor After Cleansing: No Slough/Fibrino Yes Wound Bed Granulation Amount: Small (1-33%) Exposed Structure Necrotic Amount: Large (67-100%) Fascia Exposed:  No Necrotic Quality: Adherent Slough Fat Layer (Subcutaneous Tissue) Exposed: Yes Tendon Exposed: No Muscle Exposed: No Joint Exposed: No Bone Exposed: No Treatment Notes Wound #1 (Left, Anterior Knee) Notes silvercell, xsorb, kerlix, tubi-grip Electronic Signature(s) Signed: 12/29/2019 5:40:22 PM By: Gretta Cool, BSN, RN, CWS, Kim RN, BSN Jacques, Forsan EMarland Kitchen ([id number]) Entered By: Gretta Cool, BSN, RN, CWS, Kim on 12/29/2019 11:11:21 Outlook, Sheryle Hail ([id number]) -------------------------------------------------------------------------------- Wound Assessment Details Patient Name: Brent Diaz Date of Service: 12/29/2019 9:15 AM Medical Record Number: [id number] Patient Account Number: [id number] Date of Birth/Sex: 1955/08/01 (64 y.o. Male) Treating RN: Cornell Barman Primary Care Marsel Gail: Lavera Guise Other Clinician: Referring Stelios Kirby: Lavera Guise Treating Baili Stang/Extender: STONE III, HOYT Weeks in Treatment: 0 Wound Status Wound Number: 2 Primary Etiology: Trauma, Other Wound Location: Left, Distal, Anterior Lower Leg Wound Status: Open Wounding Event: Trauma Date Acquired: 09/27/2019 Weeks Of Treatment: 0 Clustered Wound: No Photos Wound Measurements Length: (cm) Width: (cm) Depth: (cm) Area: (cm) Volume: (cm) 7.5 % Reduction in Area: 8 % Reduction in Volume: 0.1 47.124 4.712 Wound Description Classification: Full Thickness With Exposed Support Structures Wound Margin: Distinct, outline attached Exudate Amount: Large Exudate Type: Serosanguineous Exudate Color: red, brown Foul Odor After Cleansing: Yes Due to Product Use: No Slough/Fibrino Yes Wound Bed Granulation Amount: Small (1-33%) Exposed Structure Granulation Quality: Pink Fascia Exposed: No Necrotic Amount: Large (67-100%) Fat Layer (Subcutaneous Tissue) Exposed: Yes Necrotic Quality: Adherent Slough Tendon Exposed: No Muscle Exposed: No Joint Exposed: No Bone Exposed: No Electronic  Signature(s) Signed: 12/29/2019 12:28:02 PM By: Darci Needle Signed: 12/29/2019 5:40:22 PM By: Gretta Cool, BSN, RN, CWS, Kim RN, BSN Entered By: Darci Needle on 12/29/2019 10:35:42 Grassel, Sheryle Hail ([id number]) -------------------------------------------------------------------------------- Wound Assessment Details Patient Name: Brent Diaz Date of Service: 12/29/2019 9:15 AM Medical Record Number: [id number] Patient Account Number: [id number] Date of Birth/Sex: 10-12-1955 (64 y.o. Male) Treating RN: Cornell Barman Primary Care Kitai Purdom: Lavera Guise Other Clinician: Referring Jevon Littlepage: Lavera Guise Treating Walter Grima/Extender: STONE III, HOYT Weeks in Treatment: 0  Wound Status Wound Number: 3 Primary Etiology: Venous Leg Ulcer Wound Location: Right, Distal, Anterior Lower Leg Wound Status: Open Wounding Event: Gradually Appeared Comorbid History: Neuropathy Date Acquired: 12/14/2019 Weeks Of Treatment: 0 Clustered Wound: No Photos Wound Measurements Length: (cm) 4.5 Width: (cm) 5.5 Depth: (cm) 0.1 Area: (cm) 19.439 Volume: (cm) 1.944 % Reduction in Area: 0% % Reduction in Volume: 0% Epithelialization: Small (1-33%) Tunneling: No Undermining: No Wound Description Classification: Full Thickness Without Exposed Support Struc Wound Margin: Flat and Intact Exudate Amount: Large Exudate Type: Serosanguineous Exudate Color: red, brown tures Foul Odor After Cleansing: Yes Due to Product Use: No Slough/Fibrino Yes Wound Bed Granulation Amount: Small (1-33%) Exposed Structure Granulation Quality: Pink Fascia Exposed: No Necrotic Amount: Large (67-100%) Fat Layer (Subcutaneous Tissue) Exposed: Yes Necrotic Quality: Adherent Slough Tendon Exposed: No Muscle Exposed: No Joint Exposed: No Bone Exposed: No Treatment Notes Wound #3 (Right, Distal, Anterior Lower Leg) Notes silvercell, xsorb, kerlix, tubi-grip Electronic Signature(s) Signed: 12/29/2019 12:31:23 PM By:  Gretta Cool, BSN, RN, CWS, Kim RN, BSN Mahrt, [place] Kitchen ([id number]) Previous Signature: 12/29/2019 12:28:02 PM Version By: Darci Needle Entered By: Gretta Cool, BSN, RN, CWS, Kim on 12/29/2019 12:31:22 Brent Diaz ([id number]) -------------------------------------------------------------------------------- Wound Assessment Details Patient Name: Brent Diaz Date of Service: 12/29/2019 9:15 AM Medical Record Number: [id number] Patient Account Number: [id number] Date of Birth/Sex: 09-Jul-1955 (64 y.o. Male) Treating RN: Cornell Barman Primary Care Reygan Heagle: Lavera Guise Other Clinician: Referring Evelina Lore: Lavera Guise Treating Juline Sanderford/Extender: STONE III, HOYT Weeks in Treatment: 0 Wound Status Wound Number: 4 Primary Etiology: 2nd degree Burn Wound Location: Right, Lateral Lower Leg Wound Status: Open Wounding Event: Thermal Burn Comorbid History: Neuropathy Date Acquired: 12/04/2019 Weeks Of Treatment: 0 Clustered Wound: No Photos Wound Measurements Length: (cm) 7 Width: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

## 2020-01-02 LAB — AEROBIC CULTURE W GRAM STAIN (SUPERFICIAL SPECIMEN): Gram Stain: NONE SEEN

## 2020-01-05 ENCOUNTER — Ambulatory Visit: Payer: Medicare PPO | Admitting: Physician Assistant

## 2020-01-06 ENCOUNTER — Encounter: Payer: Medicare PPO | Attending: Physician Assistant | Admitting: Physician Assistant

## 2020-01-06 ENCOUNTER — Other Ambulatory Visit: Payer: Self-pay

## 2020-01-06 DIAGNOSIS — I872 Venous insufficiency (chronic) (peripheral): Secondary | ICD-10-CM | POA: Diagnosis not present

## 2020-01-06 DIAGNOSIS — T24201A Burn of second degree of unspecified site of right lower limb, except ankle and foot, initial encounter: Secondary | ICD-10-CM | POA: Insufficient documentation

## 2020-01-06 DIAGNOSIS — W860XXA Exposure to domestic wiring and appliances, initial encounter: Secondary | ICD-10-CM | POA: Diagnosis not present

## 2020-01-06 DIAGNOSIS — L97822 Non-pressure chronic ulcer of other part of left lower leg with fat layer exposed: Secondary | ICD-10-CM | POA: Diagnosis not present

## 2020-01-06 DIAGNOSIS — M6281 Muscle weakness (generalized): Secondary | ICD-10-CM | POA: Insufficient documentation

## 2020-01-06 DIAGNOSIS — W19XXXA Unspecified fall, initial encounter: Secondary | ICD-10-CM | POA: Diagnosis not present

## 2020-01-06 DIAGNOSIS — L97812 Non-pressure chronic ulcer of other part of right lower leg with fat layer exposed: Secondary | ICD-10-CM | POA: Diagnosis not present

## 2020-01-06 DIAGNOSIS — Z7901 Long term (current) use of anticoagulants: Secondary | ICD-10-CM | POA: Diagnosis not present

## 2020-01-06 DIAGNOSIS — S80212A Abrasion, left knee, initial encounter: Secondary | ICD-10-CM | POA: Insufficient documentation
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
## 2020-01-19 ENCOUNTER — Other Ambulatory Visit: Payer: Self-pay

## 2020-01-19 ENCOUNTER — Encounter: Payer: Medicare PPO | Admitting: Physician Assistant

## 2020-01-19 DIAGNOSIS — L97822 Non-pressure chronic ulcer of other part of left lower leg with fat layer exposed: Secondary | ICD-10-CM | POA: Diagnosis not present
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
## 2020-02-02 ENCOUNTER — Encounter: Payer: Medicare PPO | Admitting: Physician Assistant

## 2020-02-02 ENCOUNTER — Other Ambulatory Visit: Payer: Self-pay

## 2020-02-02 DIAGNOSIS — L97822 Non-pressure chronic ulcer of other part of left lower leg with fat layer exposed: Secondary | ICD-10-CM | POA: Diagnosis not present

## 2020-02-02 NOTE — Progress Notes (Signed)
Brent Diaz ([id number]) Visit Report for 02/02/2020 Arrival Information Details Patient Name: Brent Diaz, Brent Diaz Date of Service: 02/02/2020 8:15 AM Medical Record Number: [id number] Patient Account Number: [id number] Date of Birth/Sex: [birth year] (64 y.o. M) Treating RN: Brent Diaz Primary Care Brent Diaz: Brent Diaz Other Clinician: Referring Brent Diaz: Brent Diaz Treating Brent Diaz/Extender: Brent Diaz Weeks in Treatment: 5 Visit Information History Since Last Visit Added or deleted any medications: No Patient Arrived: Ambulatory Any new allergies or adverse reactions: No Arrival Time: 08:21 Had a fall or experienced change in No Accompanied By: self activities of daily living that may affect Transfer Assistance: None risk of falls: Patient Identification Verified: Yes Signs or symptoms of abuse/neglect since last visito No Patient Requires Transmission-Based Precautions: No Hospitalized since last visit: No Patient Has Alerts: No Has Dressing in Place as Prescribed: Yes Pain Present Now: No Electronic Signature(s) Signed: 02/02/2020 3:44:32 PM By: Brent Diaz Entered By: Brent Diaz on 02/02/2020 08:21:35 Brent Diaz ([id number]) -------------------------------------------------------------------------------- Clinic Level of Care Assessment Details Patient Name: Brent Diaz Date of Service: 02/02/2020 8:15 AM Medical Record Number: [id number] Patient Account Number: [id number] Date of Birth/Sex: [birth year] (64 y.o. M) Treating RN: Brent Diaz Primary Care Joniyah Mallinger: Brent Diaz Other Clinician: Referring Brent Diaz: Brent Diaz Treating Brent Diaz: Brent Diaz Weeks in Treatment: 5 Clinic Level of Care Assessment Items TOOL 4 Quantity Score []  - Use when only an EandM is performed on FOLLOW-UP visit 0 ASSESSMENTS - Nursing Assessment / Reassessment []  - Reassessment of Co-morbidities (includes updates in patient status) 0 []  -  0 Reassessment of Adherence to Treatment Plan ASSESSMENTS - Wound and Skin Assessment / Reassessment []  - Simple Wound Assessment / Reassessment - one wound 0 X- 4 5 Complex Wound Assessment / Reassessment - multiple wounds []  - 0 Dermatologic / Skin Assessment (not related to wound area) ASSESSMENTS - Focused Assessment X - Circumferential Edema Measurements - multi extremities 2 5 []  - 0 Nutritional Assessment / Counseling / Intervention X- 1 5 Lower Extremity Assessment (monofilament, tuning fork, pulses) []  - 0 Peripheral Arterial Disease Assessment (using hand held doppler) ASSESSMENTS - Ostomy and/or Continence Assessment and Care []  - Incontinence Assessment and Management 0 []  - 0 Ostomy Care Assessment and Management (repouching, etc.) PROCESS - Coordination of Care X - Simple Patient / Family Education for ongoing care 1 15 []  - 0 Complex (extensive) Patient / Family Education for ongoing care []  - 0 Staff obtains Programmer, systems, Records, Test Results / Process Orders []  - 0 Staff telephones HHA, Nursing Homes / Clarify orders / etc []  - 0 Routine Transfer to another Facility (non-emergent condition) []  - 0 Routine Hospital Admission (non-emergent condition) []  - 0 New Admissions / Biomedical engineer / Ordering NPWT, Apligraf, etc. []  - 0 Emergency Hospital Admission (emergent condition) X- 1 10 Simple Discharge Coordination []  - 0 Complex (extensive) Discharge Coordination PROCESS - Special Needs []  - Pediatric / Minor Patient Management 0 []  - 0 Isolation Patient Management []  - 0 Hearing / Language / Visual special needs []  - 0 Assessment of Community assistance (transportation, D/C planning, etc.) []  - 0 Additional assistance / Altered mentation []  - 0 Support Surface(s) Assessment (bed, cushion, seat, etc.) INTERVENTIONS - Wound Cleansing / Measurement [name], [name] []  - 0 Simple Wound Cleansing - one wound X- 4 5 Complex Wound  Cleansing - multiple wounds []  - 0 Wound Imaging (photographs - any number of wounds) []  - 0 Wound Tracing (instead of photographs) []  - 0  Simple Wound Measurement - one wound X- 4 5 Complex Wound Measurement - multiple wounds INTERVENTIONS - Wound Dressings X - Small Wound Dressing one or multiple wounds 4 10 []  - 0 Medium Wound Dressing one or multiple wounds []  - 0 Large Wound Dressing one or multiple wounds []  - 0 Application of Medications - topical []  - 0 Application of Medications - injection INTERVENTIONS - Miscellaneous []  - External ear exam 0 []  - 0 Specimen Collection (cultures, biopsies, blood, body fluids, etc.) []  - 0 Specimen(s) / Culture(s) sent or taken to Lab for analysis []  - 0 Patient Transfer (multiple staff / Civil Service fast streamer / Similar devices) []  - 0 Simple Staple / Suture removal (25 or less) []  - 0 Complex Staple / Suture removal (26 or more) []  - 0 Hypo / Hyperglycemic Management (close monitor of Blood Glucose) []  - 0 Ankle / Brachial Index (ABI) - do not check if billed separately X- 1 5 Vital Signs Has the patient been seen at the hospital within the last three years: Yes Total Score: 145 Level Of Care: New/Established - Level 4 Electronic Signature(s) Signed: 02/02/2020 4:23:15 PM By: Brent Diaz Entered By: Brent Diaz on 02/02/2020 08:44:36 Brent Diaz ([id number]) -------------------------------------------------------------------------------- Encounter Discharge Information Details Patient Name: Brent Diaz Date of Service: 02/02/2020 8:15 AM Medical Record Number: [id number] Patient Account Number: [id number] Date of Birth/Sex: Sep 25, 1955 (64 y.o. M) Treating RN: Brent Diaz Primary Care Cionna Collantes: Brent Diaz Other Clinician: Referring Brent Diaz: Brent Diaz Treating Adriel Kessen/Extender: Brent Diaz in Treatment: 5 Encounter Discharge Information Items Discharge Condition: Stable Ambulatory Status:  Ambulatory Discharge Destination: Home Transportation: Private Auto Accompanied By: self Schedule Follow-up Appointment: Yes Clinical Summary of Care: Electronic Signature(s) Signed: 02/02/2020 4:23:15 PM By: Brent Diaz Entered By: Brent Diaz on 02/02/2020 08:45:56 Bick, Sheryle Diaz ([id number]) -------------------------------------------------------------------------------- Lower Extremity Assessment Details Patient Name: Brent Diaz Date of Service: 02/02/2020 8:15 AM Medical Record Number: [id number] Patient Account Number: [id number] Date of Birth/Sex: 1956/02/22 (64 y.o. M) Treating RN: Brent Diaz Primary Care Amaryah Mallen: Brent Diaz Other Clinician: Referring Dezaria Methot: Brent Diaz Treating Tera Pellicane/Extender: Brent Diaz Weeks in Treatment: 5 Edema Assessment Assessed: [Left: No] [Right: No] Edema: [Left: Yes] [Right: Yes] Calf Left: Right: Point of Measurement: 33 cm From Medial Instep 34 cm 34.5 cm Ankle Left: Right: Point of Measurement: 14 cm From Medial Instep 24 cm 25 cm Vascular Assessment Pulses: Dorsalis Pedis Palpable: [Left:Yes] [Right:Yes] Electronic Signature(s) Signed: 02/02/2020 3:44:32 PM By: Brent Diaz Entered By: Brent Diaz on 02/02/2020 08:26:41 Conery, Sheryle Diaz ([id number]) -------------------------------------------------------------------------------- Multi Wound Chart Details Patient Name: Brent Diaz Date of Service: 02/02/2020 8:15 AM Medical Record Number: [id number] Patient Account Number: [id number] Date of Birth/Sex: January 27, 1956 (64 y.o. M) Treating RN: Brent Diaz Primary Care Leeya Rusconi: Brent Diaz Other Clinician: Referring Calen Posch: Brent Diaz Treating Eldridge Marcott/Extender: Brent Diaz Weeks in Treatment: 5 Vital Signs Height(in): 70 Pulse(bpm): 13 Weight(lbs): 180 Blood Pressure(mmHg): 155/83 Body Mass Index(BMI): 26 Temperature(F): 98.5 Respiratory Rate(breaths/min): 16 Photos: Wound  Location: Left, Distal, Anterior Lower Leg Right, Distal, Anterior Lower Leg Right, Lateral Lower Leg Wounding Event: Gradually Appeared Gradually Appeared Thermal Burn Primary Etiology: Venous Leg Ulcer Venous Leg Ulcer 2nd degree Burn Comorbid History: Neuropathy Neuropathy Neuropathy Date Acquired: 09/27/2019 12/14/2019 12/04/2019 Weeks of Treatment: 5 5 5  Wound Status: Open Open Open Measurements L x W x D (cm) 0.7x1.5x0.1 1.2x1x0.1 6.5x5x0.1 Area (cm) : 0.825 0.942 25.525 Volume (cm) : 0.082 0.094 2.553 % Reduction in Area: 98.20% 95.20% 28.60% %  Reduction in Volume: 98.30% 95.20% 28.60% Classification: Full Thickness With Exposed Full Thickness Without Exposed Full Thickness Without Exposed Support Structures Support Structures Support Structures Exudate Amount: Large Large Large Exudate Type: Serosanguineous Serosanguineous Serosanguineous Exudate Color: red, brown red, brown red, brown Foul Odor After Cleansing: Yes Yes Yes Odor Anticipated Due to Product No No No Use: Wound Margin: Distinct, outline attached Flat and Intact Flat and Intact Granulation Amount: Small (1-33%) Small (1-33%) Small (1-33%) Granulation Quality: Pink Pink Pink Necrotic Amount: Large (67-100%) Large (67-100%) Large (67-100%) Exposed Structures: Fat Layer (Subcutaneous Tissue): Fat Layer (Subcutaneous Tissue): Fat Layer (Subcutaneous Tissue): Yes Yes Yes Fascia: No Fascia: No Fascia: No Tendon: No Tendon: No Tendon: No Muscle: No Muscle: No Muscle: No Joint: No Joint: No Joint: No Bone: No Bone: No Bone: No Epithelialization: None Small (1-33%) Small (1-33%) Wound Number: 5 N/A N/A Photos: N/A N/A JAKOREY, MCCONATHY ([id number]) Wound Location: Right, Posterior Lower Leg N/A N/A Wounding Event: Trauma N/A N/A Primary Etiology: 2nd degree Burn N/A N/A Comorbid History: Neuropathy N/A N/A Date Acquired: 12/04/2019 N/A N/A Weeks of Treatment: 5 N/A N/A Wound Status: Open N/A  N/A Measurements L x W x D (cm) 1.5x1x0.1 N/A N/A Area (cm) : 1.178 N/A N/A Volume (cm) : 0.118 N/A N/A % Reduction in Area: 62.50% N/A N/A % Reduction in Volume: 62.40% N/A N/A Classification: Full Thickness Without Exposed N/A N/A Support Structures Exudate Amount: Medium N/A N/A Exudate Type: Serosanguineous N/A N/A Exudate Color: red, brown N/A N/A Foul Odor After Cleansing: No N/A N/A Odor Anticipated Due to Product N/A N/A N/A Use: Wound Margin: Distinct, outline attached N/A N/A Granulation Amount: Small (1-33%) N/A N/A Granulation Quality: Pink N/A N/A Necrotic Amount: Large (67-100%) N/A N/A Exposed Structures: Fat Layer (Subcutaneous Tissue): N/A N/A Yes Fascia: No Tendon: No Muscle: No Joint: No Bone: No Epithelialization: Small (1-33%) N/A N/A Treatment Notes Electronic Signature(s) Signed: 02/02/2020 4:23:15 PM By: Brent Diaz Entered By: Brent Diaz on 02/02/2020 08:37:40 Livas, Sheryle Diaz ([id number]) -------------------------------------------------------------------------------- Multi-Disciplinary Care Plan Details Patient Name: Brent Diaz Date of Service: 02/02/2020 8:15 AM Medical Record Number: [id number] Patient Account Number: [id number] Date of Birth/Sex: 11/01/1955 (64 y.o. M) Treating RN: Brent Diaz Primary Care Rashanna Christiana: Brent Diaz Other Clinician: Referring Cheick Suhr: Brent Diaz Treating Daelyn Mozer/Extender: Brent Diaz Weeks in Treatment: 5 Active Inactive Abuse / Safety / Falls / Self Care Management Nursing Diagnoses: Potential for falls Potential for injury related to falls Goals: Patient/caregiver will verbalize understanding of skin care regimen Date Initiated: 12/29/2019 Target Resolution Date: 12/29/2019 Goal Status: Active Interventions: Assess self care needs on admission and as needed Notes: Necrotic Tissue Nursing Diagnoses: Impaired tissue integrity related to necrotic/devitalized  tissue Goals: Necrotic/devitalized tissue will be minimized in the wound bed Date Initiated: 12/29/2019 Target Resolution Date: 01/29/2020 Goal Status: Active Interventions: Assess patient pain level pre-, during and post procedure and prior to discharge Treatment Activities: Apply topical anesthetic as ordered : 12/29/2019 Notes: Orientation to the Wound Care Program Nursing Diagnoses: Knowledge deficit related to the wound healing center program Goals: Patient/caregiver will verbalize understanding of the [place] Program Date Initiated: 12/29/2019 Target Resolution Date: 12/29/2019 Goal Status: Active Interventions: Provide education on orientation to the wound center Notes: Pain, Acute or Chronic Nursing Diagnoses: Pain, acute or chronic: actual or potential Goals: GLENDA, KUNST ([id number]) Patient will verbalize adequate pain control and receive pain control interventions during procedures as needed Date Initiated: 12/29/2019 Target Resolution Date: 12/29/2019 Goal Status: Active Interventions:  Assess comfort goal upon admission Reposition patient for comfort Notes: Venous Leg Ulcer Nursing Diagnoses: Potential for venous Insuffiency (use before diagnosis confirmed) Goals: Non-invasive venous studies are completed as ordered Date Initiated: 12/29/2019 Target Resolution Date: 12/29/2019 Goal Status: Active Interventions: Assess peripheral edema status every visit. Notes: Wound/Skin Impairment Nursing Diagnoses: Impaired tissue integrity Goals: Ulcer/skin breakdown will have a volume reduction of 30% by week 4 Date Initiated: 12/29/2019 Target Resolution Date: 01/29/2020 Goal Status: Active Interventions: Assess patient/caregiver ability to obtain necessary supplies Treatment Activities: Topical wound management initiated : 12/29/2019 Notes: Electronic Signature(s) Signed: 02/02/2020 4:23:15 PM By: Brent Diaz Entered By: Brent Diaz on  02/02/2020 08:37:29 Dino, Sheryle Diaz ([id number]) -------------------------------------------------------------------------------- Pain Assessment Details Patient Name: Brent Diaz Date of Service: 02/02/2020 8:15 AM Medical Record Number: [id number] Patient Account Number: [id number] Date of Birth/Sex: 1955/12/24 (64 y.o. M) Treating RN: Brent Diaz Primary Care Dailee Manalang: Brent Diaz Other Clinician: Referring Ival Basquez: Brent Diaz Treating Robby Bulkley/Extender: Brent Diaz Weeks in Treatment: 5 Active Problems Location of Pain Severity and Description of Pain Patient Has Paino No Site Locations Pain Management and Medication Current Pain Management: Electronic Signature(s) Signed: 02/02/2020 3:44:32 PM By: Brent Diaz Entered By: Brent Diaz on 02/02/2020 08:25:19 Brent Diaz ([id number]) -------------------------------------------------------------------------------- Patient/Caregiver Education Details Patient Name: Brent Diaz Date of Service: 02/02/2020 8:15 AM Medical Record Number: [id number] Patient Account Number: [id number] Date of Birth/Gender: 02-19-1956 (64 y.o. M) Treating RN: Brent Diaz Primary Care Physician: Brent Diaz Other Clinician: Referring Physician: Lavera Diaz Treating Physician/Extender: Brent Diaz in Treatment: 5 Education Assessment Education Provided To: Patient Education Topics Provided Wound/Skin Impairment: Methods: Explain/Verbal Responses: State content correctly Electronic Signature(s) Signed: 02/02/2020 4:23:15 PM By: Brent Diaz Entered By: Brent Diaz on 02/02/2020 08:44:50 Bergeson, Sheryle Diaz ([id number]) -------------------------------------------------------------------------------- Wound Assessment Details Patient Name: Brent Diaz Date of Service: 02/02/2020 8:15 AM Medical Record Number: [id number] Patient Account Number: [id number] Date of Birth/Sex: [birth year] (64 y.o. M) Treating  RN: Brent Diaz Primary Care Shawnta Schlegel: Brent Diaz Other Clinician: Referring Rosielee Corporan: Brent Diaz Treating Arie Gable/Extender: Brent Diaz Weeks in Treatment: 5 Wound Status Wound Number: 2 Primary Etiology: Venous Leg Ulcer Wound Location: Left, Distal, Anterior Lower Leg Wound Status: Open Wounding Event: Gradually Appeared Comorbid History: Neuropathy Date Acquired: 09/27/2019 Weeks Of Treatment: 5 Clustered Wound: No Photos Wound Measurements Length: (cm) 0.7 Width: (cm) 1.5 Depth: (cm) 0.1 Area: (cm) 0.825 Volume: (cm) 0.082 % Reduction in Area: 98.2% % Reduction in Volume: 98.3% Epithelialization: None Tunneling: No Undermining: No Wound Description Classification: Full Thickness With Exposed Support Structur Wound Margin: Distinct, outline attached Exudate Amount: Large Exudate Type: Serosanguineous Exudate Color: red, brown es Foul Odor After Cleansing: Yes Due to Product Use: No Slough/Fibrino Yes Wound Bed Granulation Amount: Small (1-33%) Exposed Structure Granulation Quality: Pink Fascia Exposed: No Necrotic Amount: Large (67-100%) Fat Layer (Subcutaneous Tissue) Exposed: Yes Necrotic Quality: Adherent Slough Tendon Exposed: No Muscle Exposed: No Joint Exposed: No Bone Exposed: No Treatment Notes Wound #2 (Left, Distal, Anterior Lower Leg) Notes scell, xsorb right, dry gauze left, conform, Tubi F bilateral Electronic Signature(s) Signed: 02/02/2020 3:44:32 PM By: Pleas Patricia ([id number]) Entered By: Brent Diaz on 02/02/2020 08:24:05 Brent Diaz ([id number]) -------------------------------------------------------------------------------- Wound Assessment Details Patient Name: Brent Diaz Date of Service: 02/02/2020 8:15 AM Medical Record Number: [id number] Patient Account Number: [id number] Date of Birth/Sex: [birth year] (64 y.o. M) Treating RN: Brent Diaz Primary Care Hassie Mandt: Brent Diaz Other  Clinician: Referring Makailee Nudelman: Brent Diaz Treating Muriel Wilber/Extender:  STONE III, Diaz Weeks in Treatment: 5 Wound Status Wound Number: 3 Primary Etiology: Venous Leg Ulcer Wound Location: Right, Distal, Anterior Lower Leg Wound Status: Open Wounding Event: Gradually Appeared Comorbid History: Neuropathy Date Acquired: 12/14/2019 Weeks Of Treatment: 5 Clustered Wound: No Photos Wound Measurements Length: (cm) 1.2 Width: (cm) 1 Depth: (cm) 0.1 Area: (cm) 0.942 Volume: (cm) 0.094 % Reduction in Area: 95.2% % Reduction in Volume: 95.2% Epithelialization: Small (1-33%) Tunneling: No Undermining: No Wound Description Classification: Full Thickness Without Exposed Support Struct Wound Margin: Flat and Intact Exudate Amount: Large Exudate Type: Serosanguineous Exudate Color: red, brown ures Foul Odor After Cleansing: Yes Due to Product Use: No Slough/Fibrino Yes Wound Bed Granulation Amount: Small (1-33%) Exposed Structure Granulation Quality: Pink Fascia Exposed: No Necrotic Amount: Large (67-100%) Fat Layer (Subcutaneous Tissue) Exposed: Yes Necrotic Quality: Adherent Slough Tendon Exposed: No Muscle Exposed: No Joint Exposed: No Bone Exposed: No Treatment Notes Wound #3 (Right, Distal, Anterior Lower Leg) Notes scell, xsorb right, dry gauze left, conform, Tubi F bilateral Electronic Signature(s) Signed: 02/02/2020 3:44:32 PM By: Pleas Patricia ([id number]) Entered By: Brent Diaz on 02/02/2020 08:24:23 Brent Diaz ([id number]) -------------------------------------------------------------------------------- Wound Assessment Details Patient Name: Brent Diaz Date of Service: 02/02/2020 8:15 AM Medical Record Number: [id number] Patient Account Number: [id number] Date of Birth/Sex: 09/14/1955 (64 y.o. M) Treating RN: Brent Diaz Primary Care Jerusalem Wert: Brent Diaz Other Clinician: Referring [place]: Brent Diaz Treating  Antwane Grose/Extender: STONE III, Diaz Weeks in Treatment: 5 Wound Status Wound Number: 4 Primary Etiology: 2nd degree Burn Wound Location: Right, Lateral Lower Leg Wound Status: Open Wounding Event: Thermal Burn Comorbid History: Neuropathy Date Acquired: 12/04/2019 Weeks Of Treatment: 5 Clustered Wound: No Photos Wound Measurements Length: (cm) 6.5 Width: (cm) 5 Depth: (cm) 0.1 Area: (cm) 25.525 Volume: (cm) 2.553 % Reduction in Area: 28.6% % Reduction in Volume: 28.6% Epithelialization: Small (1-33%) Wound Description Classification: Full Thickness Without Exposed Support Struct Wound Margin: Flat and Intact Exudate Amount: Large Exudate Type: Serosanguineous Exudate Color: red, brown ures Foul Odor After Cleansing: Yes Due to Product Use: No Slough/Fibrino Yes Wound Bed Granulation Amount: Small (1-33%) Exposed Structure Granulation Quality: Pink Fascia Exposed: No Necrotic Amount: Large (67-100%) Fat Layer (Subcutaneous Tissue) Exposed: Yes Necrotic Quality: Adherent Slough Tendon Exposed: No Muscle Exposed: No Joint Exposed: No Bone Exposed: No Treatment Notes Wound #4 (Right, Lateral Lower Leg) Notes scell, xsorb right, dry gauze left, conform, Tubi F bilateral Electronic Signature(s) Signed: 02/02/2020 3:44:32 PM By: Pleas Patricia ([id number]) Entered By: Brent Diaz on 02/02/2020 08:24:43 Minjares, Sheryle Diaz ([id number]) -------------------------------------------------------------------------------- Wound Assessment Details Patient Name: Brent Diaz Date of Service: 02/02/2020 8:15 AM Medical Record Number: [id number] Patient Account Number: [id number] Date of Birth/Sex: [birth year] (64 y.o. M) Treating RN: Brent Diaz Primary Care Deja Pisarski: Brent Diaz Other Clinician: Referring Dezhane Staten: Brent Diaz Treating Akayla Brass/Extender: Brent Diaz Weeks in Treatment: 5 Wound Status Wound Number: 5 Primary Etiology: 2nd degree  Burn Wound Location: Right, Posterior Lower Leg Wound Status: Open Wounding Event: Trauma Comorbid History: Neuropathy Date Acquired: 12/04/2019 Weeks Of Treatment: 5 Clustered Wound: No Photos Wound Measurements Length: (cm) 1.5 Width: (cm) 1 Depth: (cm) 0.1 Area: (cm) 1.178 Volume: (cm) 0.118 % Reduction in Area: 62.5% % Reduction in Volume: 62.4% Epithelialization: Small (1-33%) Wound Description Classification: Full Thickness Without Exposed Support Struct Wound Margin: Distinct, outline attached Exudate Amount: Medium Exudate Type: Serosanguineous Exudate Color: red, brown ures Foul Odor After Cleansing: No Slough/Fibrino No Wound Bed Granulation Amount: Small (  1-33%) Exposed Structure Granulation Quality: Pink Fascia Exposed: No Necrotic Amount: Large (67-100%) Fat Layer (Subcutaneous Tissue) Exposed: Yes Necrotic Quality: Adherent Slough Tendon Exposed: No Muscle Exposed: No Joint Exposed: No Bone Exposed: No Treatment Notes Wound #5 (Right, Posterior Lower Leg) Notes scell, xsorb right, dry gauze left, conform, Tubi F bilateral Electronic Signature(s) Signed: 02/02/2020 3:44:32 PM By: Pleas Patricia ([id number]) Entered By: Brent Diaz on 02/02/2020 08:25:03 Brent Diaz ([id number]) -------------------------------------------------------------------------------- Vitals Details Patient Name: Brent Diaz Date of Service: 02/02/2020 8:15 AM Medical Record Number: [id number] Patient Account Number: [id number] Date of Birth/Sex: [birth year] (64 y.o. M) Treating RN: Brent Diaz Primary Care Omeed Osuna: Brent Diaz Other Clinician: Referring Lillion Elbert: Brent Diaz Treating Terrianna Holsclaw/Extender: Brent Diaz Weeks in Treatment: 5 Vital Signs Time Taken: 08:20 Temperature (F): 98.5 Height (in): 70 Pulse (bpm): 71 Weight (lbs): 180 Respiratory Rate (breaths/min): 16 Body Mass Index (BMI): 25.8 Blood Pressure (mmHg): 155/83 Reference  Range: 80 - 120 mg / dl Electronic Signature(s) Signed: 02/02/2020 3:44:32 PM By: Brent Diaz Entered By: Brent Diaz on 02/02/2020 08:29:04
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
## 2020-02-09 ENCOUNTER — Other Ambulatory Visit: Payer: Self-pay

## 2020-02-09 ENCOUNTER — Encounter: Payer: Medicare PPO | Attending: Physician Assistant | Admitting: Physician Assistant

## 2020-02-09 DIAGNOSIS — M6281 Muscle weakness (generalized): Secondary | ICD-10-CM | POA: Diagnosis not present

## 2020-02-09 DIAGNOSIS — T24231A Burn of second degree of right lower leg, initial encounter: Secondary | ICD-10-CM | POA: Diagnosis not present

## 2020-02-09 DIAGNOSIS — Z7901 Long term (current) use of anticoagulants: Secondary | ICD-10-CM | POA: Diagnosis not present

## 2020-02-09 DIAGNOSIS — X58XXXA Exposure to other specified factors, initial encounter: Secondary | ICD-10-CM | POA: Insufficient documentation

## 2020-02-09 DIAGNOSIS — I872 Venous insufficiency (chronic) (peripheral): Secondary | ICD-10-CM | POA: Diagnosis not present

## 2020-02-09 DIAGNOSIS — S81802A Unspecified open wound, left lower leg, initial encounter: Secondary | ICD-10-CM | POA: Insufficient documentation

## 2020-02-09 DIAGNOSIS — S80212A Abrasion, left knee, initial encounter: Secondary | ICD-10-CM | POA: Diagnosis present

## 2020-02-09 DIAGNOSIS — S81801A Unspecified open wound, right lower leg, initial encounter: Secondary | ICD-10-CM | POA: Insufficient documentation

## 2020-02-09 DIAGNOSIS — L97822 Non-pressure chronic ulcer of other part of left lower leg with fat layer exposed: Secondary | ICD-10-CM | POA: Diagnosis not present

## 2020-02-09 DIAGNOSIS — L97812 Non-pressure chronic ulcer of other part of right lower leg with fat layer exposed: Secondary | ICD-10-CM | POA: Diagnosis not present
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
## 2020-02-23 ENCOUNTER — Encounter: Payer: Medicare PPO | Admitting: Physician Assistant

## 2020-02-23 ENCOUNTER — Other Ambulatory Visit: Payer: Self-pay

## 2020-02-23 DIAGNOSIS — S80212A Abrasion, left knee, initial encounter: Secondary | ICD-10-CM | POA: Diagnosis not present
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
## 2020-02-24 NOTE — Progress Notes (Signed)
Brent, Diaz ([id number]) Visit Report for 01/19/2020 Arrival Information Details Patient Name: Brent Diaz, Brent Diaz Date of Service: 01/19/2020 9:00 AM Medical Record Number: [id number] Patient Account Number: [id number] Date of Birth/Sex: August 14, 1955 (64 y.o. M) Treating RN: Grover Canavan Primary Care Bobbie Valletta: Brent Diaz Other Clinician: Referring Mauriah Mcmillen: Brent Diaz Treating Eldar Robitaille/Extender: Melburn Hake, HOYT Weeks in Treatment: 3 Visit Information History Since Last Visit All ordered tests and consults were completed: No Patient Arrived: Ambulatory Added or deleted any medications: No Arrival Time: 10:22 Any new allergies or adverse reactions: No Accompanied By: self Had a fall or experienced change in No Transfer Assistance: None activities of daily living that may affect Patient Identification Verified: Yes risk of falls: Secondary Verification Process Completed: Yes Signs or symptoms of abuse/neglect since last visito No Patient Requires Transmission-Based Precautions: No Hospitalized since last visit: No Patient Has Alerts: No Pain Present Now: No Electronic Signature(s) Signed: 01/19/2020 11:43:46 AM By: Darci Needle Entered By: Darci Needle on 01/19/2020 11:43:45 Miers, Sheryle Hail ([id number]) -------------------------------------------------------------------------------- Clinic Level of Care Assessment Details Patient Name: Brent Diaz Date of Service: 01/19/2020 9:00 AM Medical Record Number: [id number] Patient Account Number: [id number] Date of Birth/Sex: May 26, 1955 (64 y.o. M) Treating RN: Grover Canavan Primary Care Allahna Husband: Brent Diaz Other Clinician: Referring Favor Hackler: Brent Diaz Treating Ambrea Hegler/Extender: Melburn Hake, HOYT Weeks in Treatment: 3 Clinic Level of Care Assessment Items TOOL 4 Quantity Score []  - Use when only an EandM is performed on FOLLOW-UP visit 0 ASSESSMENTS - Nursing Assessment / Reassessment X - Reassessment of  Co-morbidities (includes updates in patient status) 1 10 X- 1 5 Reassessment of Adherence to Treatment Plan ASSESSMENTS - Wound and Skin Assessment / Reassessment []  - Simple Wound Assessment / Reassessment - one wound 0 X- 4 5 Complex Wound Assessment / Reassessment - multiple wounds []  - 0 Dermatologic / Skin Assessment (not related to wound area) ASSESSMENTS - Focused Assessment X - Circumferential Edema Measurements - multi extremities 2 5 []  - 0 Nutritional Assessment / Counseling / Intervention X- 1 5 Lower Extremity Assessment (monofilament, tuning fork, pulses) []  - 0 Peripheral Arterial Disease Assessment (using hand held doppler) ASSESSMENTS - Ostomy and/or Continence Assessment and Care []  - Incontinence Assessment and Management 0 []  - 0 Ostomy Care Assessment and Management (repouching, etc.) PROCESS - Coordination of Care X - Simple Patient / Family Education for ongoing care 1 15 []  - 0 Complex (extensive) Patient / Family Education for ongoing care []  - 0 Staff obtains Programmer, systems, Records, Test Results / Process Orders []  - 0 Staff telephones HHA, Nursing Homes / Clarify orders / etc []  - 0 Routine Transfer to another Facility (non-emergent condition) []  - 0 Routine Hospital Admission (non-emergent condition) []  - 0 New Admissions / Biomedical engineer / Ordering NPWT, Apligraf, etc. []  - 0 Emergency Hospital Admission (emergent condition) X- 1 10 Simple Discharge Coordination []  - 0 Complex (extensive) Discharge Coordination PROCESS - Special Needs []  - Pediatric / Minor Patient Management 0 []  - 0 Isolation Patient Management []  - 0 Hearing / Language / Visual special needs []  - 0 Assessment of Community assistance (transportation, D/C planning, etc.) []  - 0 Additional assistance / Altered mentation []  - 0 Support Surface(s) Assessment (bed, cushion, seat, etc.) INTERVENTIONS - Wound Cleansing / Measurement Pruden, Hilary E. ([id number]) []  -  0 Simple Wound Cleansing - one wound X- 4 5 Complex Wound Cleansing - multiple wounds []  - 0 Wound Imaging (photographs - any number of wounds) []  - 0 Wound  Tracing (instead of photographs) []  - 0 Simple Wound Measurement - one wound X- 4 5 Complex Wound Measurement - multiple wounds INTERVENTIONS - Wound Dressings []  - Small Wound Dressing one or multiple wounds 0 X- 4 15 Medium Wound Dressing one or multiple wounds []  - 0 Large Wound Dressing one or multiple wounds []  - 0 Application of Medications - topical []  - 0 Application of Medications - injection INTERVENTIONS - Miscellaneous []  - External ear exam 0 []  - 0 Specimen Collection (cultures, biopsies, blood, body fluids, etc.) []  - 0 Specimen(s) / Culture(s) sent or taken to Lab for analysis []  - 0 Patient Transfer (multiple staff / Civil Service fast streamer / Similar devices) []  - 0 Simple Staple / Suture removal (25 or less) []  - 0 Complex Staple / Suture removal (26 or more) []  - 0 Hypo / Hyperglycemic Management (close monitor of Blood Glucose) []  - 0 Ankle / Brachial Index (ABI) - do not check if billed separately X- 1 5 Vital Signs Has the patient been seen at the hospital within the last three years: Yes Total Score: 180 Level Of Care: New/Established - Level 5 Electronic Signature(s) Signed: 01/19/2020 4:34:46 PM By: Grover Canavan Entered By: Grover Canavan on 01/19/2020 10:20:45 Brent Diaz ([id number]) -------------------------------------------------------------------------------- Encounter Discharge Information Details Patient Name: Brent Diaz Date of Service: 01/19/2020 9:00 AM Medical Record Number: [id number] Patient Account Number: [id number] Date of Birth/Sex: 08-23-1955 (64 y.o. M) Treating RN: Grover Canavan Primary Care Omauri Boeve: Brent Diaz Other Clinician: Referring Maaliyah Adolph: Brent Diaz Treating Pinki Rottman/Extender: Melburn Hake, HOYT Weeks in Treatment: 3 Encounter Discharge  Information Items Discharge Condition: Stable Ambulatory Status: Ambulatory Discharge Destination: Home Transportation: Private Auto Accompanied By: self Schedule Follow-up Appointment: Yes Clinical Summary of Care: Electronic Signature(s) Signed: 01/19/2020 4:34:46 PM By: Grover Canavan Entered By: Grover Canavan on 01/19/2020 10:22:40 Jesus, Sheryle Hail ([id number]) -------------------------------------------------------------------------------- Lower Extremity Assessment Details Patient Name: Brent Diaz Date of Service: 01/19/2020 9:00 AM Medical Record Number: [id number] Patient Account Number: [id number] Date of Birth/Sex: July 27, 1955 (64 y.o. M) Treating RN: Cornell Barman Primary Care Merrie Epler: Brent Diaz Other Clinician: Referring Lary Eckardt: Brent Diaz Treating Ivon Oelkers/Extender: Melburn Hake, HOYT Weeks in Treatment: 3 Edema Assessment Assessed: Shirlyn Goltz: Yes] [Right: Yes] Edema: [Left: Yes] [Right: Yes] Calf Left: Right: Point of Measurement: 33 cm From Medial Instep 26.5 cm 32 cm Ankle Left: Right: Point of Measurement: 14 cm From Medial Instep 25.5 cm 32 cm Vascular Assessment Pulses: Dorsalis Pedis Palpable: [Left:No] [Right:No] Doppler Audible: [Left:Yes] [Right:Yes] Posterior Tibial Palpable: [Left:No Yes] [Right:No Yes] Electronic Signature(s) Signed: 01/19/2020 11:45:17 AM By: Darci Needle Signed: 02/23/2020 6:09:58 PM By: Gretta Cool, BSN, RN, CWS, Kim RN, BSN Entered By: Darci Needle on 01/19/2020 10:05:30 Brent Diaz ([id number]) -------------------------------------------------------------------------------- Multi Wound Chart Details Patient Name: Brent Diaz Date of Service: 01/19/2020 9:00 AM Medical Record Number: [id number] Patient Account Number: [id number] Date of Birth/Sex: [birth year] (64 y.o. M) Treating RN: Grover Canavan Primary Care Janessa Mickle: Brent Diaz Other Clinician: Referring Haliey Romberg: Brent Diaz Treating  Tevin Shillingford/Extender: Melburn Hake, HOYT Weeks in Treatment: 3 Photos: Wound Location: Left, Anterior Knee Left, Distal, Anterior Lower Leg Right, Distal, Anterior Lower Leg Wounding Event: Trauma Gradually Appeared Gradually Appeared Primary Etiology: Trauma, Other Venous Leg Ulcer Venous Leg Ulcer Comorbid History: Neuropathy Neuropathy Neuropathy Date Acquired: 12/14/2019 09/27/2019 12/14/2019 Weeks of Treatment: 3 3 3  Wound Status: Open Open Open Measurements L x W x D (cm) 0.1x0.1x0 7x6.8x0.1 3.8x5.5x0.1 Area (cm) : 0.008 37.385 16.415 Volume (cm) : 0.001 3.738 1.641 %  Reduction in Area: 100.00% 20.70% 15.60% % Reduction in Volume: 100.00% 20.70% 15.60% Classification: Full Thickness Without Exposed Full Thickness With Exposed Full Thickness Without Exposed Support Structures Support Structures Support Structures Exudate Amount: N/A Large Large Exudate Type: N/A Serosanguineous Serosanguineous Exudate Color: N/A red, brown red, brown Foul Odor After Cleansing: N/A Yes Yes Odor Anticipated Due to Product N/A No No Use: Wound Margin: N/A Distinct, outline attached Flat and Intact Granulation Amount: N/A Small (1-33%) Small (1-33%) Granulation Quality: N/A Pink Pink Necrotic Amount: N/A Large (67-100%) Large (67-100%) Epithelialization: N/A N/A Small (1-33%) Wound Number: 4 5 N/A Photos: N/A Wound Location: Right, Lateral Lower Leg Right, Posterior Lower Leg N/A Wounding Event: Thermal Burn Trauma N/A Primary Etiology: 2nd degree Burn 2nd degree Burn N/A Comorbid History: Neuropathy Neuropathy N/A Date Acquired: 12/04/2019 12/04/2019 N/A Weeks of Treatment: 3 3 N/A Wound Status: Open Open N/A Measurements L x W x D (cm) 7x6x0.1 2x1.9x0.1 N/A Area (cm) : 32.987 2.985 N/A Volume (cm) : 3.299 0.298 N/A % Reduction in Area: 7.70% 5.00% N/A % Reduction in Volume: 7.70% 5.10% N/A ALHAJI, MCNEAL. ([id number]) Classification: Full Thickness Without Exposed Full Thickness Without Exposed  N/A Support Structures Support Structures Exudate Amount: Large Medium N/A Exudate Type: Serosanguineous Serosanguineous N/A Exudate Color: red, brown red, brown N/A Foul Odor After Cleansing: Yes No N/A Odor Anticipated Due to Product No N/A N/A Use: Wound Margin: Flat and Intact Distinct, outline attached N/A Granulation Amount: Small (1-33%) Small (1-33%) N/A Granulation Quality: Pink Pink N/A Necrotic Amount: Large (67-100%) Large (67-100%) N/A Exposed Structures: Fat Layer (Subcutaneous Tissue): Fat Layer (Subcutaneous Tissue): N/A Yes Yes Fascia: No Fascia: No Tendon: No Tendon: No Muscle: No Muscle: No Joint: No Joint: No Bone: No Bone: No Epithelialization: Small (1-33%) Small (1-33%) N/A Treatment Notes Electronic Signature(s) Signed: 01/19/2020 4:34:46 PM By: Grover Canavan Entered By: Grover Canavan on 01/19/2020 10:10:43 Pricer, Sheryle Hail ([id number]) -------------------------------------------------------------------------------- Multi-Disciplinary Care Plan Details Patient Name: Brent Diaz Date of Service: 01/19/2020 9:00 AM Medical Record Number: [id number] Patient Account Number: [id number] Date of Birth/Sex: 12-04-1955 (64 y.o. M) Treating RN: Grover Canavan Primary Care Sonam Huelsmann: Brent Diaz Other Clinician: Referring Colin Ellers: Brent Diaz Treating Manya Balash/Extender: Melburn Hake, HOYT Weeks in Treatment: 3 Active Inactive Abuse / Safety / Falls / Self Care Management Nursing Diagnoses: Potential for falls Potential for injury related to falls Goals: Patient/caregiver will verbalize understanding of skin care regimen Date Initiated: 12/29/2019 Target Resolution Date: 12/29/2019 Goal Status: Active Interventions: Assess self care needs on admission and as needed Notes: Necrotic Tissue Nursing Diagnoses: Impaired tissue integrity related to necrotic/devitalized tissue Goals: Necrotic/devitalized tissue will be minimized in the wound  bed Date Initiated: 12/29/2019 Target Resolution Date: 01/29/2020 Goal Status: Active Interventions: Assess patient pain level pre-, during and post procedure and prior to discharge Treatment Activities: Apply topical anesthetic as ordered : 12/29/2019 Notes: Orientation to the Wound Care Program Nursing Diagnoses: Knowledge deficit related to the wound healing center program Goals: Patient/caregiver will verbalize understanding of the [facility] Date Initiated: 12/29/2019 Target Resolution Date: 12/29/2019 Goal Status: Active Interventions: Provide education on orientation to the wound center Notes: Pain, Acute or Chronic Nursing Diagnoses: Pain, acute or chronic: actual or potential Goals: JOSHVA, LABRECK ([id number]) Patient will verbalize adequate pain control and receive pain control interventions during procedures as needed Date Initiated: 12/29/2019 Target Resolution Date: 12/29/2019 Goal Status: Active Interventions: Assess comfort goal upon admission Reposition patient for comfort Notes: Venous Leg Ulcer Nursing Diagnoses: Potential  for venous Insuffiency (use before diagnosis confirmed) Goals: Non-invasive venous studies are completed as ordered Date Initiated: 12/29/2019 Target Resolution Date: 12/29/2019 Goal Status: Active Interventions: Assess peripheral edema status every visit. Notes: Wound/Skin Impairment Nursing Diagnoses: Impaired tissue integrity Goals: Ulcer/skin breakdown will have a volume reduction of 30% by week 4 Date Initiated: 12/29/2019 Target Resolution Date: 01/29/2020 Goal Status: Active Interventions: Assess patient/caregiver ability to obtain necessary supplies Treatment Activities: Topical wound management initiated : 12/29/2019 Notes: Electronic Signature(s) Signed: 01/19/2020 4:34:46 PM By: Grover Canavan Entered By: Grover Canavan on 01/19/2020 10:10:35 Santillano, Sheryle Hail  ([id number]) -------------------------------------------------------------------------------- Pain Assessment Details Patient Name: Brent Diaz Date of Service: 01/19/2020 9:00 AM Medical Record Number: [id number] Patient Account Number: [id number] Date of Birth/Sex: 10-May-1955 (64 y.o. M) Treating RN: Grover Canavan Primary Care Isayah Ignasiak: Brent Diaz Other Clinician: Referring Aztlan Coll: Brent Diaz Treating Will Schier/Extender: Melburn Hake, HOYT Weeks in Treatment: 3 Active Problems Location of Pain Severity and Description of Pain Patient Has Paino No Site Locations With Dressing Change: No Pain Management and Medication Current Pain Management: Electronic Signature(s) Signed: 01/19/2020 11:44:07 AM By: Darci Needle Signed: 01/19/2020 4:34:46 PM By: Grover Canavan Entered By: Darci Needle on 01/19/2020 11:44:06 Mccutcheon, Sheryle Hail ([id number]) -------------------------------------------------------------------------------- Patient/Caregiver Education Details Patient Name: Brent Diaz Date of Service: 01/19/2020 9:00 AM Medical Record Number: [id number] Patient Account Number: [id number] Date of Birth/Gender: [birth year] (64 y.o. M) Treating RN: Grover Canavan Primary Care Physician: Brent Diaz Other Clinician: Referring Physician: Lavera Diaz Treating Physician/Extender: Melburn Hake, HOYT Weeks in Treatment: 3 Education Assessment Education Provided To: Patient Education Topics Provided Wound/Skin Impairment: Handouts: Caring for Your Ulcer Methods: Explain/Verbal Responses: State content correctly Electronic Signature(s) Signed: 01/19/2020 4:34:46 PM By: Grover Canavan Entered By: Grover Canavan on 01/19/2020 10:12:31 Theissen, Sheryle Hail ([id number]) -------------------------------------------------------------------------------- Wound Assessment Details Patient Name: Brent Diaz Date of Service: 01/19/2020 9:00 AM Medical Record Number:  [id number] Patient Account Number: [id number] Date of Birth/Sex: Jan 06, 1956 (64 y.o. M) Treating RN: Grover Canavan Primary Care Aliscia Clayton: Brent Diaz Other Clinician: Referring Sparkle Aube: Brent Diaz Treating Ece Cumberland/Extender: Melburn Hake, HOYT Weeks in Treatment: 3 Wound Status Wound Number: 1 Primary Etiology: Trauma, Other Wound Location: Left, Anterior Knee Wound Status: Healed - Epithelialized Wounding Event: Trauma Comorbid History: Neuropathy Date Acquired: 12/14/2019 Weeks Of Treatment: 3 Clustered Wound: No Photos Wound Measurements Length: (cm) Width: (cm) Depth: (cm) Area: (cm) Volume: (cm) 0 % Reduction in Area: 100% 0 % Reduction in Volume: 100% 0 0 0 Wound Description Classification: Full Thickness Without Exposed Support Structu res Electronic Signature(s) Signed: 01/19/2020 4:34:46 PM By: Grover Canavan Entered By: Grover Canavan on 01/19/2020 10:11:16 Lemus, Sheryle Hail ([id number]) -------------------------------------------------------------------------------- Wound Assessment Details Patient Name: Brent Diaz Date of Service: 01/19/2020 9:00 AM Medical Record Number: [id number] Patient Account Number: [id number] Date of Birth/Sex: 03-16-1956 (64 y.o. M) Treating RN: Cornell Barman Primary Care Arman Loy: Brent Diaz Other Clinician: Referring Ginevra Tacker: Brent Diaz Treating Rahm Minix/Extender: Melburn Hake, HOYT Weeks in Treatment: 3 Wound Status Wound Number: 2 Primary Etiology: Venous Leg Ulcer Wound Location: Left, Distal, Anterior Lower Leg Wound Status: Open Wounding Event: Gradually Appeared Comorbid History: Neuropathy Date Acquired: 09/27/2019 Weeks Of Treatment: 3 Clustered Wound: No Photos Wound Measurements Length: (cm) 7 Width: (cm) 6.8 Depth: (cm) 0.1 Area: (cm) 37.385 Volume: (cm) 3.738 % Reduction in Area: 20.7% % Reduction in Volume: 20.7% Wound Description Classification: Full Thickness With Exposed Support  Structures Wound Margin: Distinct, outline attached Exudate Amount: Large Exudate Type: Serosanguineous Exudate Color: red, brown Foul Odor After  Cleansing: Yes Due to Product Use: No Slough/Fibrino Yes Wound Bed Granulation Amount: Small (1-33%) Exposed Structure Granulation Quality: Pink Fascia Exposed: No Necrotic Amount: Large (67-100%) Fat Layer (Subcutaneous Tissue) Exposed: Yes Necrotic Quality: Adherent Slough Tendon Exposed: No Muscle Exposed: No Joint Exposed: No Bone Exposed: No Electronic Signature(s) Signed: 01/19/2020 11:45:17 AM By: Darci Needle Signed: 02/23/2020 6:09:58 PM By: Gretta Cool, BSN, RN, CWS, Kim RN, BSN Entered By: Darci Needle on 01/19/2020 09:59:24 Canizales, Sheryle Hail ([id number]) -------------------------------------------------------------------------------- Wound Assessment Details Patient Name: Brent Diaz Date of Service: 01/19/2020 9:00 AM Medical Record Number: [id number] Patient Account Number: [id number] Date of Birth/Sex: 1955-10-18 (64 y.o. M) Treating RN: Cornell Barman Primary Care Cordero Surette: Brent Diaz Other Clinician: Referring Chante Mayson: Brent Diaz Treating Massiah Longanecker/Extender: Melburn Hake, HOYT Weeks in Treatment: 3 Wound Status Wound Number: 3 Primary Etiology: Venous Leg Ulcer Wound Location: Right, Distal, Anterior Lower Leg Wound Status: Open Wounding Event: Gradually Appeared Comorbid History: Neuropathy Date Acquired: 12/14/2019 Weeks Of Treatment: 3 Clustered Wound: No Photos Wound Measurements Length: (cm) 3.8 Width: (cm) 5.5 Depth: (cm) 0.1 Area: (cm) 16.415 Volume: (cm) 1.641 % Reduction in Area: 15.6% % Reduction in Volume: 15.6% Epithelialization: Small (1-33%) Wound Description Classification: Full Thickness Without Exposed Support Structu Wound Margin: Flat and Intact Exudate Amount: Large Exudate Type: Serosanguineous Exudate Color: red, brown res Foul Odor After Cleansing: Yes Due to Product Use:  No Slough/Fibrino Yes Wound Bed Granulation Amount: Small (1-33%) Exposed Structure Granulation Quality: Pink Fascia Exposed: No Necrotic Amount: Large (67-100%) Fat Layer (Subcutaneous Tissue) Exposed: Yes Necrotic Quality: Adherent Slough Tendon Exposed: No Muscle Exposed: No Joint Exposed: No Bone Exposed: No Electronic Signature(s) Signed: 01/19/2020 11:45:17 AM By: Darci Needle Signed: 02/23/2020 6:09:58 PM By: Gretta Cool, BSN, RN, CWS, Kim RN, BSN Entered By: Darci Needle on 01/19/2020 09:59:41 Satter, Sheryle Hail ([id number]) -------------------------------------------------------------------------------- Wound Assessment Details Patient Name: Brent Diaz Date of Service: 01/19/2020 9:00 AM Medical Record Number: [id number] Patient Account Number: [id number] Date of Birth/Sex: Aug 08, 1955 (64 y.o. M) Treating RN: Cornell Barman Primary Care Jaleya Pebley: Brent Diaz Other Clinician: Referring Diane Mochizuki: Brent Diaz Treating Baxter Gonzalez/Extender: Melburn Hake, HOYT Weeks in Treatment: 3 Wound Status Wound Number: 4 Primary Etiology: 2nd degree Burn Wound Location: Right, Lateral Lower Leg Wound Status: Open Wounding Event: Thermal Burn Comorbid History: Neuropathy Date Acquired: 12/04/2019 Weeks Of Treatment: 3 Clustered Wound: No Photos Wound Measurements Length: (cm) 7 Width: (cm) 6 Depth: (cm) 0.1 Area: (cm) 32.987 Volume: (cm) 3.299 % Reduction in Area: 7.7% % Reduction in Volume: 7.7% Epithelialization: Small (1-33%) Wound Description Classification: Full Thickness Without Exposed Support Structu Wound Margin: Flat and Intact Exudate Amount: Large Exudate Type: Serosanguineous Exudate Color: red, brown res Foul Odor After Cleansing: Yes Due to Product Use: No Slough/Fibrino Yes Wound Bed Granulation Amount: Small (1-33%) Exposed Structure Granulation Quality: Pink Fascia Exposed: No Necrotic Amount: Large (67-100%) Fat Layer (Subcutaneous Tissue) Exposed:  Yes Necrotic Quality: Adherent Slough Tendon Exposed: No Muscle Exposed: No Joint Exposed: No Bone Exposed: No Electronic Signature(s) Signed: 01/19/2020 11:45:17 AM By: Darci Needle Signed: 02/23/2020 6:09:58 PM By: Gretta Cool, BSN, RN, CWS, Kim RN, BSN Entered By: Darci Needle on 01/19/2020 10:00:12 Brent Diaz ([id number]) -------------------------------------------------------------------------------- Wound Assessment Details Patient Name: Brent Diaz Date of Service: 01/19/2020 9:00 AM Medical Record Number: [id number] Patient Account Number: [id number] Date of Birth/Sex: 04/02/1956 (64 y.o. M) Treating RN: Cornell Barman Primary Care Lanaysia Fritchman: Brent Diaz Other Clinician: Referring Toney Lizaola: Brent Diaz Treating Pryor Guettler/Extender: STONE III, HOYT Weeks in Treatment: 3 Wound Status  Wound Number: 5 Primary Etiology: 2nd degree Burn Wound Location: Right, Posterior Lower Leg Wound Status: Open Wounding Event: Trauma Comorbid History: Neuropathy Date Acquired: 12/04/2019 Weeks Of Treatment: 3 Clustered Wound: No Photos Wound Measurements Length: (cm) 2 Width: (cm) 1.9 Depth: (cm) 0.1 Area: (cm) 2.985 Volume: (cm) 0.298 % Reduction in Area: 5% % Reduction in Volume: 5.1% Epithelialization: Small (1-33%) Wound Description Classification: Full Thickness Without Exposed Support Structu Wound Margin: Distinct, outline attached Exudate Amount: Medium Exudate Type: Serosanguineous Exudate Color: red, brown res Foul Odor After Cleansing: No Slough/Fibrino No Wound Bed Granulation Amount: Small (1-33%) Exposed Structure Granulation Quality: Pink Fascia Exposed: No Necrotic Amount: Large (67-100%) Fat Layer (Subcutaneous Tissue) Exposed: Yes Necrotic Quality: Adherent Slough Tendon Exposed: No Muscle Exposed: No Joint Exposed: No Bone Exposed: No Electronic Signature(s) Signed: 01/19/2020 11:45:17 AM By: Darci Needle Signed: 02/23/2020 6:09:58 PM By:  Gretta Cool, BSN, RN, CWS, Kim RN, BSN Entered By: Darci Needle on 01/19/2020 10:00:28 Brent Diaz ([id number]) -------------------------------------------------------------------------------- [place] Details Patient Name: Brent Diaz Date of Service: 01/19/2020 9:00 AM Medical Record Number: [id number] Patient Account Number: [id number] Date of Birth/Sex: [birth year] (64 y.o. M) Treating RN: Grover Canavan Primary Care Wetona Viramontes: Brent Diaz Other Clinician: Referring Everlynn Sagun: Brent Diaz Treating Kadon Andrus/Extender: Melburn Hake, HOYT Weeks in Treatment: 3 Vital Signs Time Taken: 10:23 Temperature (F): 98.1 Height (in): 70 Pulse (bpm): 88 Weight (lbs): 180 Respiratory Rate (breaths/min): 18 Body Mass Index (BMI): 25.8 Blood Pressure (mmHg): 151/88 Reference Range: 80 - 120 mg / dl Electronic Signature(s) Signed: 01/19/2020 11:43:54 AM By: Darci Needle Entered By: Darci Needle on 01/19/2020 11:43:54

## 2020-03-08 ENCOUNTER — Encounter: Payer: Medicare PPO | Attending: Physician Assistant | Admitting: Physician Assistant

## 2020-03-08 ENCOUNTER — Other Ambulatory Visit: Payer: Self-pay

## 2020-03-08 DIAGNOSIS — Z7901 Long term (current) use of anticoagulants: Secondary | ICD-10-CM | POA: Diagnosis not present

## 2020-03-08 DIAGNOSIS — S81802A Unspecified open wound, left lower leg, initial encounter: Secondary | ICD-10-CM | POA: Insufficient documentation

## 2020-03-08 DIAGNOSIS — S81801A Unspecified open wound, right lower leg, initial encounter: Secondary | ICD-10-CM | POA: Diagnosis not present

## 2020-03-08 DIAGNOSIS — L97822 Non-pressure chronic ulcer of other part of left lower leg with fat layer exposed: Secondary | ICD-10-CM | POA: Diagnosis not present

## 2020-03-08 DIAGNOSIS — M6281 Muscle weakness (generalized): Secondary | ICD-10-CM | POA: Insufficient documentation

## 2020-03-08 DIAGNOSIS — T24231A Burn of second degree of right lower leg, initial encounter: Secondary | ICD-10-CM | POA: Diagnosis not present

## 2020-03-08 DIAGNOSIS — I872 Venous insufficiency (chronic) (peripheral): Secondary | ICD-10-CM | POA: Insufficient documentation

## 2020-03-08 DIAGNOSIS — L97812 Non-pressure chronic ulcer of other part of right lower leg with fat layer exposed: Secondary | ICD-10-CM | POA: Diagnosis not present

## 2020-03-08 DIAGNOSIS — S80212A Abrasion, left knee, initial encounter: Secondary | ICD-10-CM | POA: Diagnosis present

## 2020-03-09 NOTE — Progress Notes (Signed)
LUCION, DILGER ([id number]) Visit Report for 03/08/2020 Arrival Information Details Patient Name: Brent Diaz, Brent Diaz Date of Service: 03/08/2020 8:00 AM Medical Record Number: [id number] Patient Account Number: [id number] Date of Birth/Sex: 10-06-1955 (64 y.o. M) Treating RN: Dolan Amen Primary Care Jissell Trafton: Lavera Guise Other Clinician: Referring Shady Bradish: Lavera Guise Treating Alonte Wulff/Extender: Skipper Cliche in Treatment: 10 Visit Information History Since Last Visit Pain Present Now: No Patient Arrived: Ambulatory Arrival Time: 08:20 Accompanied By: wife Transfer Assistance: None Patient Identification Verified: Yes Secondary Verification Process Completed: Yes Patient Requires Transmission-Based Precautions: No Patient Has Alerts: No Electronic Signature(s) Signed: 03/08/2020 9:38:58 AM By: Georges Mouse, Minus Breeding Entered By: Georges Mouse, Minus Breeding on 03/08/2020 08:21:07 Brent Diaz ([id number]) -------------------------------------------------------------------------------- Clinic Level of Care Assessment Details Patient Name: Brent Diaz Date of Service: 03/08/2020 8:00 AM Medical Record Number: [id number] Patient Account Number: [id number] Date of Birth/Sex: 1955/08/05 (64 y.o. M) Treating RN: Dolan Amen Primary Care Gerrard Crystal: Lavera Guise Other Clinician: Referring Elkin Belfield: Lavera Guise Treating Marcele Kosta/Extender: Skipper Cliche in Treatment: 10 Clinic Level of Care Assessment Items TOOL 4 Quantity Score []  - Use when only an EandM is performed on FOLLOW-UP visit 0 ASSESSMENTS - Nursing Assessment / Reassessment X - Reassessment of Co-morbidities (includes updates in patient status) 1 10 X- 1 5 Reassessment of Adherence to Treatment Plan ASSESSMENTS - Wound and Skin Assessment / Reassessment []  - Simple Wound Assessment / Reassessment - one wound 0 X- 2 5 Complex Wound Assessment / Reassessment - multiple wounds []  - 0 Dermatologic / Skin  Assessment (not related to wound area) ASSESSMENTS - Focused Assessment X - Circumferential Edema Measurements - multi extremities 1 5 []  - 0 Nutritional Assessment / Counseling / Intervention X- 1 5 Lower Extremity Assessment (monofilament, tuning fork, pulses) []  - 0 Peripheral Arterial Disease Assessment (using hand held doppler) ASSESSMENTS - Ostomy and/or Continence Assessment and Care []  - Incontinence Assessment and Management 0 []  - 0 Ostomy Care Assessment and Management (repouching, etc.) PROCESS - Coordination of Care X - Simple Patient / Family Education for ongoing care 1 15 []  - 0 Complex (extensive) Patient / Family Education for ongoing care []  - 0 Staff obtains Programmer, systems, Records, Test Results / Process Orders []  - 0 Staff telephones HHA, Nursing Homes / Clarify orders / etc []  - 0 Routine Transfer to another Facility (non-emergent condition) []  - 0 Routine Hospital Admission (non-emergent condition) []  - 0 New Admissions / Biomedical engineer / Ordering NPWT, Apligraf, etc. []  - 0 Emergency Hospital Admission (emergent condition) X- 1 10 Simple Discharge Coordination []  - 0 Complex (extensive) Discharge Coordination PROCESS - Special Needs []  - Pediatric / Minor Patient Management 0 []  - 0 Isolation Patient Management []  - 0 Hearing / Language / Visual special needs []  - 0 Assessment of Community assistance (transportation, D/C planning, etc.) []  - 0 Additional assistance / Altered mentation []  - 0 Support Surface(s) Assessment (bed, cushion, seat, etc.) INTERVENTIONS - Wound Cleansing / Measurement Hover, Soren E. ([id number]) []  - 0 Simple Wound Cleansing - one wound X- 2 5 Complex Wound Cleansing - multiple wounds X- 1 5 Wound Imaging (photographs - any number of wounds) []  - 0 Wound Tracing (instead of photographs) []  - 0 Simple Wound Measurement - one wound X- 2 5 Complex Wound Measurement - multiple wounds INTERVENTIONS - Wound  Dressings []  - Small Wound Dressing one or multiple wounds 0 []  - 0 Medium Wound Dressing one or multiple wounds []  - 0 Large Wound Dressing one  or multiple wounds []  - 0 Application of Medications - topical []  - 0 Application of Medications - injection INTERVENTIONS - Miscellaneous []  - External ear exam 0 []  - 0 Specimen Collection (cultures, biopsies, blood, body fluids, etc.) []  - 0 Specimen(s) / Culture(s) sent or taken to Lab for analysis []  - 0 Patient Transfer (multiple staff / Civil Service fast streamer / Similar devices) []  - 0 Simple Staple / Suture removal (25 or less) []  - 0 Complex Staple / Suture removal (26 or more) []  - 0 Hypo / Hyperglycemic Management (close monitor of Blood Glucose) []  - 0 Ankle / Brachial Index (ABI) - do not check if billed separately X- 1 5 Vital Signs Has the patient been seen at the hospital within the last three years: Yes Total Score: 90 Level Of Care: New/Established - Level 3 Electronic Signature(s) Signed: 03/08/2020 9:38:58 AM By: Georges Mouse, Minus Breeding Entered By: Georges Mouse, Minus Breeding on 03/08/2020 08:37:20 Vanostrand, Sheryle Hail ([id number]) -------------------------------------------------------------------------------- Encounter Discharge Information Details Patient Name: Brent Diaz Date of Service: 03/08/2020 8:00 AM Medical Record Number: [id number] Patient Account Number: [id number] Date of Birth/Sex: 1955/09/27 (64 y.o. M) Treating RN: Dolan Amen Primary Care Dajohn Ellender: Lavera Guise Other Clinician: Referring Kamron Portee: Lavera Guise Treating Sharayah Renfrow/Extender: Skipper Cliche in Treatment: 10 Encounter Discharge Information Items Discharge Condition: Stable Ambulatory Status: Ambulatory Discharge Destination: Home Transportation: Private Auto Accompanied By: family Schedule Follow-up Appointment: No Clinical Summary of Care: Electronic Signature(s) Signed: 03/08/2020 9:38:58 AM By: Georges Mouse, Minus Breeding Entered By:  Georges Mouse, Minus Breeding on 03/08/2020 08:37:52 Dupras, Sheryle Hail ([id number]) -------------------------------------------------------------------------------- Lower Extremity Assessment Details Patient Name: Brent Diaz Date of Service: 03/08/2020 8:00 AM Medical Record Number: [id number] Patient Account Number: [id number] Date of Birth/Sex: 02/25/1956 (64 y.o. M) Treating RN: Dolan Amen Primary Care Anees Vanecek: Lavera Guise Other Clinician: Referring Galilee Pierron: Lavera Guise Treating Zayra Devito/Extender: Jeri Cos Weeks in Treatment: 10 Edema Assessment Assessed: [Left: No] Patrice Paradise: Yes] Edema: [Left: N] [Right: o] Calf Left: Right: Point of Measurement: From Medial Instep 31.5 cm Ankle Left: Right: Point of Measurement: From Medial Instep 21 cm Electronic Signature(s) Signed: 03/08/2020 9:38:58 AM By: Georges Mouse, Minus Breeding Entered By: Georges Mouse, Minus Breeding on 03/08/2020 08:31:00 Hunsberger, Sheryle Hail ([id number]) -------------------------------------------------------------------------------- Multi Wound Chart Details Patient Name: Brent Diaz Date of Service: 03/08/2020 8:00 AM Medical Record Number: [id number] Patient Account Number: [id number] Date of Birth/Sex: November 16, 1955 (64 y.o. M) Treating RN: Dolan Amen Primary Care Luisa Louk: Lavera Guise Other Clinician: Referring Pheonix Wisby: Lavera Guise Treating Ryhanna Dunsmore/Extender: Jeri Cos Weeks in Treatment: 10 Vital Signs Height(in): 70 Pulse(bpm): 80 Weight(lbs): 180 Blood Pressure(mmHg): 169/99 Body Mass Index(BMI): 26 Temperature(F): 98.3 Respiratory Rate(breaths/min): 18 Photos: [N/A:N/A] Wound Location: Right, Distal, Anterior Lower Leg Right, Lateral Lower Leg N/A Wounding Event: Gradually Appeared Thermal Burn N/A Primary Etiology: Venous Leg Ulcer 2nd degree Burn N/A Comorbid History: Neuropathy Neuropathy N/A Date Acquired: 12/14/2019 12/04/2019 N/A Weeks of Treatment: 10 10 N/A Wound Status: Healed -  Epithelialized Healed - Epithelialized N/A Measurements L x W x D (cm) 0x0x0 0x0x0 N/A Area (cm) : 0 0 N/A Volume (cm) : 0 0 N/A % Reduction in Area: 100.00% 100.00% N/A % Reduction in Volume: 100.00% 100.00% N/A Classification: Full Thickness Without Exposed Partial Thickness N/A Support Structures Exudate Amount: None Present None Present N/A Wound Margin: Flat and Intact Flat and Intact N/A Granulation Amount: None Present (0%) None Present (0%) N/A Necrotic Amount: None Present (0%) None Present (0%) N/A Exposed Structures: Fascia: No Fascia: No N/A Fat Layer (Subcutaneous Tissue):  Fat Layer (Subcutaneous Tissue): No No Tendon: No Tendon: No Muscle: No Muscle: No Joint: No Joint: No Bone: No Bone: No Epithelialization: Large (67-100%) Large (67-100%) N/A Treatment Notes Electronic Signature(s) Signed: 03/08/2020 9:38:58 AM By: Georges Mouse, Minus Breeding Entered By: Georges Mouse, Minus Breeding on 03/08/2020 08:35:51 Ogata, Sheryle Hail ([id number]) -------------------------------------------------------------------------------- Multi-Disciplinary Care Plan Details Patient Name: Brent Diaz Date of Service: 03/08/2020 8:00 AM Medical Record Number: [id number] Patient Account Number: [id number] Date of Birth/Sex: 06/27/1955 (64 y.o. M) Treating RN: Dolan Amen Primary Care Mercades Bajaj: Lavera Guise Other Clinician: Referring Jodell Weitman: Lavera Guise Treating Pippa Hanif/Extender: Jeri Cos Weeks in Treatment: 10 Active Inactive Electronic Signature(s) Signed: 03/08/2020 9:38:58 AM By: Georges Mouse, Minus Breeding Entered By: Georges Mouse, Minus Breeding on 03/08/2020 08:35:44 Heathcock, Sheryle Hail ([id number]) -------------------------------------------------------------------------------- Pain Assessment Details Patient Name: Brent Diaz Date of Service: 03/08/2020 8:00 AM Medical Record Number: [id number] Patient Account Number: [id number] Date of Birth/Sex: 09/02/1955 (64 y.o. M) Treating  RN: Dolan Amen Primary Care Deania Siguenza: Lavera Guise Other Clinician: Referring Bret Stamour: Lavera Guise Treating Amrom Ore/Extender: Skipper Cliche in Treatment: 10 Active Problems Location of Pain Severity and Description of Pain Patient Has Paino No Site Locations Rate the pain. Current Pain Level: 0 Pain Management and Medication Current Pain Management: Electronic Signature(s) Signed: 03/08/2020 9:38:58 AM By: Georges Mouse, Minus Breeding Entered By: Georges Mouse, Minus Breeding on 03/08/2020 08:22:39 Brent Diaz ([id number]) -------------------------------------------------------------------------------- Patient/Caregiver Education Details Patient Name: Brent Diaz Date of Service: 03/08/2020 8:00 AM Medical Record Number: [id number] Patient Account Number: [id number] Date of Birth/Gender: Feb 15, 1956 (64 y.o. M) Treating RN: Dolan Amen Primary Care Physician: Lavera Guise Other Clinician: Referring Physician: Lavera Guise Treating Physician/Extender: Skipper Cliche in Treatment: 10 Education Assessment Education Provided To: Patient Education Topics Provided Wound/Skin Impairment: Handouts: Caring for Your Ulcer Methods: Demonstration, Explain/Verbal Responses: State content correctly Electronic Signature(s) Signed: 03/08/2020 9:38:58 AM By: Georges Mouse, Minus Breeding Entered By: Georges Mouse, Minus Breeding on 03/08/2020 08:37:34 Semmes, Sheryle Hail ([id number]) -------------------------------------------------------------------------------- Wound Assessment Details Patient Name: Brent Diaz Date of Service: 03/08/2020 8:00 AM Medical Record Number: [id number] Patient Account Number: [id number] Date of Birth/Sex: Jun 19, 1955 (64 y.o. M) Treating RN: Dolan Amen Primary Care Debbe Crumble: Lavera Guise Other Clinician: Referring Glyndon Tursi: Lavera Guise Treating Zael Shuman/Extender: Jeri Cos Weeks in Treatment: 10 Wound Status Wound Number: 3 Primary Etiology: Venous Leg  Ulcer Wound Location: Right, Distal, Anterior Lower Leg Wound Status: Healed - Epithelialized Wounding Event: Gradually Appeared Comorbid History: Neuropathy Date Acquired: 12/14/2019 Weeks Of Treatment: 10 Clustered Wound: No Photos Wound Measurements Length: (cm) 0 Width: (cm) 0 Depth: (cm) 0 Area: (cm) 0 Volume: (cm) 0 % Reduction in Area: 100% % Reduction in Volume: 100% Epithelialization: Large (67-100%) Tunneling: No Undermining: No Wound Description Classification: Full Thickness Without Exposed Support Structure Wound Margin: Flat and Intact Exudate Amount: None Present s Foul Odor After Cleansing: No Slough/Fibrino Yes Wound Bed Granulation Amount: None Present (0%) Exposed Structure Necrotic Amount: None Present (0%) Fascia Exposed: No Fat Layer (Subcutaneous Tissue) Exposed: No Tendon Exposed: No Muscle Exposed: No Joint Exposed: No Bone Exposed: No Electronic Signature(s) Signed: 03/08/2020 9:38:58 AM By: Georges Mouse, Minus Breeding Entered By: Georges Mouse, Minus Breeding on 03/08/2020 08:35:28 Brent Diaz ([id number]) -------------------------------------------------------------------------------- Wound Assessment Details Patient Name: Brent Diaz Date of Service: 03/08/2020 8:00 AM Medical Record Number: [id number] Patient Account Number: [id number] Date of Birth/Sex: Mar 05, 1956 (64 y.o. M) Treating RN: Dolan Amen Primary Care Athens Lebeau: Lavera Guise Other Clinician: Referring Kenady Doxtater: Lavera Guise Treating Emilly Lavey/Extender: Jeri Cos Weeks in Treatment: 10 Wound Status  Wound Number: 4 Primary Etiology: 2nd degree Burn Wound Location: Right, Lateral Lower Leg Wound Status: Healed - Epithelialized Wounding Event: Thermal Burn Comorbid History: Neuropathy Date Acquired: 12/04/2019 Weeks Of Treatment: 10 Clustered Wound: No Photos Wound Measurements Length: (cm) 0 Width: (cm) 0 Depth: (cm) 0 Area: (cm) 0 Volume: (cm) 0 % Reduction in  Area: 100% % Reduction in Volume: 100% Epithelialization: Large (67-100%) Tunneling: No Undermining: No Wound Description Classification: Partial Thickness Wound Margin: Flat and Intact Exudate Amount: None Present Foul Odor After Cleansing: No Slough/Fibrino No Wound Bed Granulation Amount: None Present (0%) Exposed Structure Necrotic Amount: None Present (0%) Fascia Exposed: No Fat Layer (Subcutaneous Tissue) Exposed: No Tendon Exposed: No Muscle Exposed: No Joint Exposed: No Bone Exposed: No Electronic Signature(s) Signed: 03/08/2020 9:38:58 AM By: Georges Mouse, Minus Breeding Entered By: Georges Mouse, Minus Breeding on 03/08/2020 08:35:29 Brent Diaz ([id number]) -------------------------------------------------------------------------------- [place] Details Patient Name: Brent Diaz Date of Service: 03/08/2020 8:00 AM Medical Record Number: [id number] Patient Account Number: [id number] Date of Birth/Sex: [birth year] (64 y.o. M) Treating RN: Dolan Amen Primary Care Massimiliano Rohleder: Lavera Guise Other Clinician: Referring Ulas Zuercher: Lavera Guise Treating Lisset Ketchem/Extender: Jeri Cos Weeks in Treatment: 10 Vital Signs Time Taken: 08:21 Temperature (F): 98.3 Height (in): 70 Pulse (bpm): 80 Weight (lbs): 180 Respiratory Rate (breaths/min): 18 Body Mass Index (BMI): 25.8 Blood Pressure (mmHg): 169/99 Reference Range: 80 - 120 mg / dl Electronic Signature(s) Signed: 03/08/2020 9:38:58 AM By: Georges Mouse, Minus Breeding Entered By: Georges Mouse, Minus Breeding on 03/08/2020 08:22:02
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
## 2020-03-14 NOTE — Progress Notes (Signed)
COBURN, KNAUS ([id number]) Visit Report for 02/23/2020 Arrival Information Details Patient Name: Brent Diaz, Brent Diaz Date of Service: 02/23/2020 8:00 AM Medical Record Number: [id number] Patient Account Number: [id number] Date of Birth/Sex: Apr 08, 1956 (64 y.o. M) Treating RN: Carlene Coria Primary Care Gustavia Carie: Lavera Guise Other Clinician: Referring Inna Tisdell: Lavera Guise Treating Iona Stay/Extender: Melburn Hake, HOYT Weeks in Treatment: 8 Visit Information History Since Last Visit All ordered tests and consults were completed: No Patient Arrived: Ambulatory Added or deleted any medications: No Arrival Time: 08:11 Any new allergies or adverse reactions: No Accompanied By: wife Had a fall or experienced change in No Transfer Assistance: None activities of daily living that may affect Patient Identification Verified: Yes risk of falls: Secondary Verification Process Completed: Yes Signs or symptoms of abuse/neglect since last visito No Patient Requires Transmission-Based Precautions: No Hospitalized since last visit: No Patient Has Alerts: No Implantable device outside of the clinic excluding No cellular tissue based products placed in the center since last visit: Has Dressing in Place as Prescribed: Yes Pain Present Now: No Electronic Signature(s) Signed: 03/14/2020 12:05:28 PM By: Carlene Coria RN Entered By: Carlene Coria on 02/23/2020 08:12:01 Brent Diaz ([id number]) -------------------------------------------------------------------------------- Clinic Level of Care Assessment Details Patient Name: Brent Diaz Date of Service: 02/23/2020 8:00 AM Medical Record Number: [id number] Patient Account Number: [id number] Date of Birth/Sex: [birth year] (64 y.o. M) Treating RN: Cornell Barman Primary Care Khilee Hendricksen: Lavera Guise Other Clinician: Referring Allegra Cerniglia: Lavera Guise Treating Azrael Maddix/Extender: Melburn Hake, HOYT Weeks in Treatment: 8 Clinic Level of Care Assessment  Items TOOL 4 Quantity Score []  - Use when only an EandM is performed on FOLLOW-UP visit 0 ASSESSMENTS - Nursing Assessment / Reassessment []  - Reassessment of Co-morbidities (includes updates in patient status) 0 []  - 0 Reassessment of Adherence to Treatment Plan ASSESSMENTS - Wound and Skin Assessment / Reassessment []  - Simple Wound Assessment / Reassessment - one wound 0 X- 2 5 Complex Wound Assessment / Reassessment - multiple wounds []  - 0 Dermatologic / Skin Assessment (not related to wound area) ASSESSMENTS - Focused Assessment []  - Circumferential Edema Measurements - multi extremities 0 []  - 0 Nutritional Assessment / Counseling / Intervention []  - 0 Lower Extremity Assessment (monofilament, tuning fork, pulses) []  - 0 Peripheral Arterial Disease Assessment (using hand held doppler) ASSESSMENTS - Ostomy and/or Continence Assessment and Care []  - Incontinence Assessment and Management 0 []  - 0 Ostomy Care Assessment and Management (repouching, etc.) PROCESS - Coordination of Care X - Simple Patient / Family Education for ongoing care 1 15 []  - 0 Complex (extensive) Patient / Family Education for ongoing care []  - 0 Staff obtains Programmer, systems, Records, Test Results / Process Orders []  - 0 Staff telephones HHA, Nursing Homes / Clarify orders / etc []  - 0 Routine Transfer to another Facility (non-emergent condition) []  - 0 Routine Hospital Admission (non-emergent condition) []  - 0 New Admissions / Biomedical engineer / Ordering NPWT, Apligraf, etc. []  - 0 Emergency Hospital Admission (emergent condition) X- 1 10 Simple Discharge Coordination []  - 0 Complex (extensive) Discharge Coordination PROCESS - Special Needs []  - Pediatric / Minor Patient Management 0 []  - 0 Isolation Patient Management []  - 0 Hearing / Language / Visual special needs []  - 0 Assessment of Community assistance (transportation, D/C planning, etc.) []  - 0 Additional assistance / Altered  mentation []  - 0 Support Surface(s) Assessment (bed, cushion, seat, etc.) INTERVENTIONS - Wound Cleansing / Measurement Brent Diaz, Brent E. ([id number]) []  - 0 Simple Wound Cleansing - one  wound X- 2 5 Complex Wound Cleansing - multiple wounds X- 1 5 Wound Imaging (photographs - any number of wounds) []  - 0 Wound Tracing (instead of photographs) []  - 0 Simple Wound Measurement - one wound X- 2 5 Complex Wound Measurement - multiple wounds INTERVENTIONS - Wound Dressings []  - Small Wound Dressing one or multiple wounds 0 X- 1 15 Medium Wound Dressing one or multiple wounds []  - 0 Large Wound Dressing one or multiple wounds []  - 0 Application of Medications - topical []  - 0 Application of Medications - injection INTERVENTIONS - Miscellaneous []  - External ear exam 0 []  - 0 Specimen Collection (cultures, biopsies, blood, body fluids, etc.) []  - 0 Specimen(s) / Culture(s) sent or taken to Lab for analysis []  - 0 Patient Transfer (multiple staff / Civil Service fast streamer / Similar devices) []  - 0 Simple Staple / Suture removal (25 or less) []  - 0 Complex Staple / Suture removal (26 or more) []  - 0 Hypo / Hyperglycemic Management (close monitor of Blood Glucose) []  - 0 Ankle / Brachial Index (ABI) - do not check if billed separately X- 1 5 Vital Signs Has the patient been seen at the hospital within the last three years: Yes Total Score: 80 Level Of Care: New/Established - Level 3 Electronic Signature(s) Signed: 02/23/2020 5:56:15 PM By: Gretta Cool, BSN, RN, CWS, Kim RN, BSN Entered By: Gretta Cool, BSN, RN, CWS, Kim on 02/23/2020 08:44:54 Brent Diaz ([id number]) -------------------------------------------------------------------------------- Encounter Discharge Information Details Patient Name: Brent Diaz Date of Service: 02/23/2020 8:00 AM Medical Record Number: [id number] Patient Account Number: [id number] Date of Birth/Sex: 1955/10/12 (64 y.o. M) Treating RN: Cornell Barman Primary  Care Lilias Lorensen: Lavera Guise Other Clinician: Referring Anistyn Graddy: Lavera Guise Treating Unika Nazareno/Extender: Melburn Hake, HOYT Weeks in Treatment: 8 Encounter Discharge Information Items Discharge Condition: Stable Ambulatory Status: Ambulatory Discharge Destination: Home Transportation: Private Auto Accompanied By: wife Schedule Follow-up Appointment: Yes Clinical Summary of Care: Electronic Signature(s) Signed: 02/23/2020 5:56:15 PM By: Gretta Cool, BSN, RN, CWS, Kim RN, BSN Entered By: Gretta Cool, BSN, RN, CWS, Kim on 02/23/2020 08:45:52 Brent Diaz ([id number]) -------------------------------------------------------------------------------- Lower Extremity Assessment Details Patient Name: Brent Diaz Date of Service: 02/23/2020 8:00 AM Medical Record Number: [id number] Patient Account Number: [id number] Date of Birth/Sex: December 09, 1955 (64 y.o. M) Treating RN: Carlene Coria Primary Care Lashonne Shull: Lavera Guise Other Clinician: Referring Malayshia All: Lavera Guise Treating Meghanne Pletz/Extender: Melburn Hake, HOYT Weeks in Treatment: 8 Edema Assessment Assessed: [Left: No] [Right: No] [Left: Edema] [Right: :] Calf Left: Right: Point of Measurement: From Medial Instep 31 cm 30 cm Ankle Left: Right: Point of Measurement: From Medial Instep 24 cm 21 cm Electronic Signature(s) Signed: 03/14/2020 12:05:28 PM By: Carlene Coria RN Entered By: Carlene Coria on 02/23/2020 08:26:25 Brent Diaz, Brent Diaz ([id number]) -------------------------------------------------------------------------------- Multi Wound Chart Details Patient Name: Brent Diaz Date of Service: 02/23/2020 8:00 AM Medical Record Number: [id number] Patient Account Number: [id number] Date of Birth/Sex: [birth year] (64 y.o. M) Treating RN: Cornell Barman Primary Care Royelle Hinchman: Lavera Guise Other Clinician: Referring Weylyn Ricciuti: Lavera Guise Treating Quayshaun Hubbert/Extender: Melburn Hake, HOYT Weeks in Treatment: 8 Vital Signs Height(in):  70 Pulse(bpm): 80 Weight(lbs): 180 Blood Pressure(mmHg): 169/93 Body Mass Index(BMI): 26 Temperature(F): 98.1 Respiratory Rate(breaths/min): 18 Photos: Wound Location: Left, Distal, Anterior Lower Leg Right, Distal, Anterior Lower Leg Right, Lateral Lower Leg Wounding Event: Gradually Appeared Gradually Appeared Thermal Burn Primary Etiology: Venous Leg Ulcer Venous Leg Ulcer 2nd degree Burn Comorbid History: Neuropathy Neuropathy Neuropathy Date Acquired: 09/27/2019 12/14/2019 12/04/2019 Weeks of Treatment: 8 8  8 Wound Status: Open Open Open Measurements L x W x D (cm) 0x0x0 0.6x0.4x0.1 0.8x0.2x0.1 Area (cm) : 0 0.188 0.126 Volume (cm) : 0 0.019 0.013 % Reduction in Area: 100.00% 99.00% 99.60% % Reduction in Volume: 100.00% 99.00% 99.60% Classification: Full Thickness With Exposed Full Thickness Without Exposed Full Thickness Without Exposed Support Structures Support Structures Support Structures Exudate Amount: None Present Medium Medium Exudate Type: N/A Serosanguineous Serosanguineous Exudate Color: N/A red, brown red, brown Wound Margin: Distinct, outline attached Flat and Intact Flat and Intact Granulation Amount: None Present (0%) None Present (0%) None Present (0%) Necrotic Amount: None Present (0%) Large (67-100%) Large (67-100%) Necrotic Tissue: N/A Eschar, Adherent [place] Exposed Structures: Fascia: No Fascia: No Fat Layer (Subcutaneous Tissue): Fat Layer (Subcutaneous Tissue): Fat Layer (Subcutaneous Tissue): Yes No No Fascia: No Tendon: No Tendon: No Tendon: No Muscle: No Muscle: No Muscle: No Joint: No Joint: No Joint: No Bone: No Bone: No Bone: No Epithelialization: Large (67-100%) Small (1-33%) Small (1-33%) Wound Number: 5 N/A N/A Photos: N/A N/A Wound Location: Right, Posterior Lower Leg N/A N/A Brent Diaz, Brent Diaz ([id number]) Wounding Event: Trauma N/A N/A Primary Etiology: 2nd degree Burn N/A N/A Comorbid History:  Neuropathy N/A N/A Date Acquired: 12/04/2019 N/A N/A Weeks of Treatment: 8 N/A N/A Wound Status: Open N/A N/A Measurements L x W x D (cm) 0x0x0 N/A N/A Area (cm) : 0 N/A N/A Volume (cm) : 0 N/A N/A % Reduction in Area: 100.00% N/A N/A % Reduction in Volume: 100.00% N/A N/A Classification: Full Thickness Without Exposed N/A N/A Support Structures Exudate Amount: None Present N/A N/A Exudate Type: N/A N/A N/A Exudate Color: N/A N/A N/A Wound Margin: Distinct, outline attached N/A N/A Granulation Amount: None Present (0%) N/A N/A Necrotic Amount: None Present (0%) N/A N/A Necrotic Tissue: N/A N/A N/A Exposed Structures: Fascia: No N/A N/A Fat Layer (Subcutaneous Tissue): No Tendon: No Muscle: No Joint: No Bone: No Epithelialization: Large (67-100%) N/A N/A Treatment Notes Electronic Signature(s) Signed: 02/23/2020 5:56:15 PM By: Gretta Cool, BSN, RN, CWS, Kim RN, BSN Entered By: Gretta Cool, BSN, RN, CWS, Kim on 02/23/2020 08:41:20 Brent Diaz, Brent Diaz ([id number]) -------------------------------------------------------------------------------- Multi-Disciplinary Care Plan Details Patient Name: Brent Diaz Date of Service: 02/23/2020 8:00 AM Medical Record Number: [id number] Patient Account Number: [id number] Date of Birth/Sex: 1955-10-11 (64 y.o. M) Treating RN: Cornell Barman Primary Care Eretria Manternach: Lavera Guise Other Clinician: Referring Shyheim Tanney: Lavera Guise Treating Maylie Ashton/Extender: Melburn Hake, HOYT Weeks in Treatment: 8 Active Inactive Abuse / Safety / Falls / Self Care Management Nursing Diagnoses: Potential for falls Potential for injury related to falls Goals: Patient/caregiver will verbalize understanding of skin care regimen Date Initiated: 12/29/2019 Target Resolution Date: 12/29/2019 Goal Status: Active Interventions: Assess self care needs on admission and as needed Notes: Necrotic Tissue Nursing Diagnoses: Impaired tissue integrity related to necrotic/devitalized  tissue Goals: Necrotic/devitalized tissue will be minimized in the wound bed Date Initiated: 12/29/2019 Target Resolution Date: 01/29/2020 Goal Status: Active Interventions: Assess patient pain level pre-, during and post procedure and prior to discharge Treatment Activities: Apply topical anesthetic as ordered : 12/29/2019 Notes: Wound/Skin Impairment Nursing Diagnoses: Impaired tissue integrity Goals: Ulcer/skin breakdown will have a volume reduction of 30% by week 4 Date Initiated: 12/29/2019 Target Resolution Date: 01/29/2020 Goal Status: Active Interventions: Assess patient/caregiver ability to obtain necessary supplies Treatment Activities: Topical wound management initiated : 12/29/2019 Notes: Electronic Signature(s) Signed: 02/23/2020 5:56:15 PM By: Gretta Cool, BSN, RN, CWS, Kim RN, BSN Brent Diaz, Brent Diaz ([id number]) Entered By: Gretta Cool, BSN,  RN, CWS, Kim on 02/23/2020 08:33:54 Brent Diaz, Brent Diaz ([id number]) -------------------------------------------------------------------------------- Pain Assessment Details Patient Name: Brent Diaz, Brent Diaz Date of Service: 02/23/2020 8:00 AM Medical Record Number: [id number] Patient Account Number: [id number] Date of Birth/Sex: [birth year] (64 y.o. M) Treating RN: Carlene Coria Primary Care Marcus Groll: Lavera Guise Other Clinician: Referring Halley Shepheard: Lavera Guise Treating Ariday Brinker/Extender: Melburn Hake, HOYT Weeks in Treatment: 8 Active Problems Location of Pain Severity and Description of Pain Patient Has Paino No Site Locations Pain Management and Medication Current Pain Management: Electronic Signature(s) Signed: 03/14/2020 12:05:28 PM By: Carlene Coria RN Entered By: Carlene Coria on 02/23/2020 08:12:38 Brent Diaz ([id number]) -------------------------------------------------------------------------------- Patient/Caregiver Education Details Patient Name: Brent Diaz Date of Service: 02/23/2020 8:00 AM Medical Record Number:  [id number] Patient Account Number: [id number] Date of Birth/Gender: 1956-03-23 (64 y.o. M) Treating RN: Cornell Barman Primary Care Physician: Lavera Guise Other Clinician: Referring Physician: Lavera Guise Treating Physician/Extender: Melburn Hake, HOYT Weeks in Treatment: 8 Education Assessment Education Provided To: Patient Education Topics Provided Wound/Skin Impairment: Handouts: Caring for Your Ulcer Methods: Demonstration, Explain/Verbal Responses: State content correctly Electronic Signature(s) Signed: 02/23/2020 5:56:15 PM By: Gretta Cool, BSN, RN, CWS, Kim RN, BSN Entered By: Gretta Cool, BSN, RN, CWS, Kim on 02/23/2020 08:45:16 Brent Diaz ([id number]) -------------------------------------------------------------------------------- Wound Assessment Details Patient Name: Brent Diaz Date of Service: 02/23/2020 8:00 AM Medical Record Number: [id number] Patient Account Number: [id number] Date of Birth/Sex: [birth year] (64 y.o. M) Treating RN: Carlene Coria Primary Care Delrick Dehart: Lavera Guise Other Clinician: Referring Jamason Peckham: Lavera Guise Treating Noel Rodier/Extender: Melburn Hake, HOYT Weeks in Treatment: 8 Wound Status Wound Number: 2 Primary Etiology: Venous Leg Ulcer Wound Location: Left, Distal, Anterior Lower Leg Wound Status: Open Wounding Event: Gradually Appeared Comorbid History: Neuropathy Date Acquired: 09/27/2019 Weeks Of Treatment: 8 Clustered Wound: No Photos Wound Measurements Length: (cm) Width: (cm) Depth: (cm) Area: (cm) Volume: (cm) 0 % Reduction in Area: 100% 0 % Reduction in Volume: 100% 0 Epithelialization: Large (67-100%) 0 Tunneling: No 0 Undermining: No Wound Description Classification: Full Thickness With Exposed Support Structures Wound Margin: Distinct, outline attached Exudate Amount: None Present Foul Odor After Cleansing: No Slough/Fibrino No Wound Bed Granulation Amount: None Present (0%) Exposed Structure Necrotic Amount: None  Present (0%) Fascia Exposed: No Fat Layer (Subcutaneous Tissue) Exposed: No Tendon Exposed: No Muscle Exposed: No Joint Exposed: No Bone Exposed: No Electronic Signature(s) Signed: 03/14/2020 12:05:28 PM By: Carlene Coria RN Entered By: Carlene Coria on 02/23/2020 08:23:45 Brent Diaz, Brent Diaz ([id number]) -------------------------------------------------------------------------------- Wound Assessment Details Patient Name: Brent Diaz Date of Service: 02/23/2020 8:00 AM Medical Record Number: [id number] Patient Account Number: [id number] Date of Birth/Sex: 1955/08/22 (64 y.o. M) Treating RN: Carlene Coria Primary Care Oceana Walthall: Lavera Guise Other Clinician: Referring Tomoko Sandra: Lavera Guise Treating Niyah Mamaril/Extender: Melburn Hake, HOYT Weeks in Treatment: 8 Wound Status Wound Number: 3 Primary Etiology: Venous Leg Ulcer Wound Location: Right, Distal, Anterior Lower Leg Wound Status: Open Wounding Event: Gradually Appeared Comorbid History: Neuropathy Date Acquired: 12/14/2019 Weeks Of Treatment: 8 Clustered Wound: No Photos Wound Measurements Length: (cm) 0.6 Width: (cm) 0.4 Depth: (cm) 0.1 Area: (cm) 0.188 Volume: (cm) 0.019 % Reduction in Area: 99% % Reduction in Volume: 99% Epithelialization: Small (1-33%) Tunneling: No Undermining: No Wound Description Classification: Full Thickness Without Exposed Support Structu Wound Margin: Flat and Intact Exudate Amount: Medium Exudate Type: Serosanguineous Exudate Color: red, brown res Foul Odor After Cleansing: No Slough/Fibrino Yes Wound Bed Granulation Amount: None Present (0%) Exposed Structure Necrotic Amount: Large (67-100%) Fascia Exposed: No Necrotic Quality: Eschar, Adherent  [place] Fat Layer (Subcutaneous Tissue) Exposed: No Tendon Exposed: No Muscle Exposed: No Joint Exposed: No Bone Exposed: No Electronic Signature(s) Signed: 03/14/2020 12:05:28 PM By: Carlene Coria RN Entered By: Carlene Coria on  02/23/2020 08:24:42 Brent Diaz, Brent Diaz ([id number]) -------------------------------------------------------------------------------- Wound Assessment Details Patient Name: Brent Diaz Date of Service: 02/23/2020 8:00 AM Medical Record Number: [id number] Patient Account Number: [id number] Date of Birth/Sex: 11-23-1955 (64 y.o. M) Treating RN: Carlene Coria Primary Care Yair Dusza: Lavera Guise Other Clinician: Referring Ryle Buscemi: Lavera Guise Treating Kimimila Tauzin/Extender: Melburn Hake, HOYT Weeks in Treatment: 8 Wound Status Wound Number: 4 Primary Etiology: 2nd degree Burn Wound Location: Right, Lateral Lower Leg Wound Status: Open Wounding Event: Thermal Burn Comorbid History: Neuropathy Date Acquired: 12/04/2019 Weeks Of Treatment: 8 Clustered Wound: No Photos Wound Measurements Length: (cm) 0.8 Width: (cm) 0.2 Depth: (cm) 0.1 Area: (cm) 0.126 Volume: (cm) 0.013 % Reduction in Area: 99.6% % Reduction in Volume: 99.6% Epithelialization: Small (1-33%) Tunneling: No Undermining: No Wound Description Classification: Full Thickness Without Exposed Support Structu Wound Margin: Flat and Intact Exudate Amount: Medium Exudate Type: Serosanguineous Exudate Color: red, brown res Foul Odor After Cleansing: No Slough/Fibrino Yes Wound Bed Granulation Amount: None Present (0%) Exposed Structure Necrotic Amount: Large (67-100%) Fascia Exposed: No Necrotic Quality: Eschar, Adherent [place] Fat Layer (Subcutaneous Tissue) Exposed: Yes Tendon Exposed: No Muscle Exposed: No Joint Exposed: No Bone Exposed: No Electronic Signature(s) Signed: 03/14/2020 12:05:28 PM By: Carlene Coria RN Entered By: Carlene Coria on 02/23/2020 08:25:04 Brent Diaz, Brent Diaz ([id number]) -------------------------------------------------------------------------------- Wound Assessment Details Patient Name: Brent Diaz Date of Service: 02/23/2020 8:00 AM Medical Record Number: [id number] Patient Account  Number: [id number] Date of Birth/Sex: [birth year] (64 y.o. M) Treating RN: Carlene Coria Primary Care Kristof Nadeem: Lavera Guise Other Clinician: Referring Zaire Levesque: Lavera Guise Treating Florita Nitsch/Extender: Melburn Hake, HOYT Weeks in Treatment: 8 Wound Status Wound Number: 5 Primary Etiology: 2nd degree Burn Wound Location: Right, Posterior Lower Leg Wound Status: Open Wounding Event: Trauma Comorbid History: Neuropathy Date Acquired: 12/04/2019 Weeks Of Treatment: 8 Clustered Wound: No Photos Wound Measurements Length: (cm) 0 Width: (cm) 0 Depth: (cm) 0 Area: (cm) 0 Volume: (cm) 0 % Reduction in Area: 100% % Reduction in Volume: 100% Epithelialization: Large (67-100%) Tunneling: No Undermining: No Wound Description Classification: Full Thickness Without Exposed Support Structure Wound Margin: Distinct, outline attached Exudate Amount: None Present s Foul Odor After Cleansing: No Slough/Fibrino No Wound Bed Granulation Amount: None Present (0%) Exposed Structure Necrotic Amount: None Present (0%) Fascia Exposed: No Fat Layer (Subcutaneous Tissue) Exposed: No Tendon Exposed: No Muscle Exposed: No Joint Exposed: No Bone Exposed: No Electronic Signature(s) Signed: 03/14/2020 12:05:28 PM By: Carlene Coria RN Entered By: Carlene Coria on 02/23/2020 08:25:37 Bolen, Brent Diaz ([id number]) -------------------------------------------------------------------------------- [place] Details Patient Name: Brent Diaz Date of Service: 02/23/2020 8:00 AM Medical Record Number: [id number] Patient Account Number: [id number] Date of Birth/Sex: 01-20-1956 (64 y.o. M) Treating RN: Carlene Coria Primary Care Dnasia Gauna: Lavera Guise Other Clinician: Referring Yassen Kinnett: Lavera Guise Treating Olivea Sonnen/Extender: Melburn Hake, HOYT Weeks in Treatment: 8 Vital Signs Time Taken: 08:12 Temperature (F): 98.1 Height (in): 70 Pulse (bpm): 80 Weight (lbs): 180 Respiratory Rate (breaths/min): 18 Body  Mass Index (BMI): 25.8 Blood Pressure (mmHg): 169/93 Reference Range: 80 - 120 mg / dl Electronic Signature(s) Signed: 03/14/2020 12:05:28 PM By: Carlene Coria RN Entered By: Carlene Coria on 02/23/2020 08:12:31

## 2020-03-21 ENCOUNTER — Other Ambulatory Visit: Payer: Self-pay

## 2020-03-21 DIAGNOSIS — D72829 Elevated white blood cell count, unspecified: Secondary | ICD-10-CM

## 2020-03-21 DIAGNOSIS — D72821 Monocytosis (symptomatic): Secondary | ICD-10-CM

## 2020-03-21 DIAGNOSIS — D729 Disorder of white blood cells, unspecified: Secondary | ICD-10-CM

## 2020-03-21 DIAGNOSIS — E876 Hypokalemia: Secondary | ICD-10-CM

## 2020-03-26 ENCOUNTER — Other Ambulatory Visit: Payer: Self-pay

## 2020-03-26 ENCOUNTER — Inpatient Hospital Stay: Payer: Medicare PPO | Attending: Hematology and Oncology

## 2020-03-26 DIAGNOSIS — D729 Disorder of white blood cells, unspecified: Secondary | ICD-10-CM

## 2020-03-26 DIAGNOSIS — Z7901 Long term (current) use of anticoagulants: Secondary | ICD-10-CM | POA: Diagnosis not present

## 2020-03-26 DIAGNOSIS — D72828 Other elevated white blood cell count: Secondary | ICD-10-CM | POA: Diagnosis present

## 2020-03-26 DIAGNOSIS — I10 Essential (primary) hypertension: Secondary | ICD-10-CM | POA: Diagnosis not present

## 2020-03-26 DIAGNOSIS — K5090 Crohn's disease, unspecified, without complications: Secondary | ICD-10-CM | POA: Diagnosis not present

## 2020-03-26 DIAGNOSIS — K219 Gastro-esophageal reflux disease without esophagitis: Secondary | ICD-10-CM | POA: Diagnosis not present

## 2020-03-26 DIAGNOSIS — Z79899 Other long term (current) drug therapy: Secondary | ICD-10-CM | POA: Diagnosis not present

## 2020-03-26 DIAGNOSIS — D72829 Elevated white blood cell count, unspecified: Secondary | ICD-10-CM

## 2020-03-26 DIAGNOSIS — F17210 Nicotine dependence, cigarettes, uncomplicated: Secondary | ICD-10-CM | POA: Diagnosis not present

## 2020-03-26 DIAGNOSIS — Z7952 Long term (current) use of systemic steroids: Secondary | ICD-10-CM | POA: Diagnosis not present

## 2020-03-26 DIAGNOSIS — D72821 Monocytosis (symptomatic): Secondary | ICD-10-CM

## 2020-03-26 DIAGNOSIS — E876 Hypokalemia: Secondary | ICD-10-CM

## 2020-03-26 DIAGNOSIS — Z86711 Personal history of pulmonary embolism: Secondary | ICD-10-CM | POA: Diagnosis not present

## 2020-03-26 LAB — CBC WITH DIFFERENTIAL/PLATELET
Abs Immature Granulocytes: 1.92 10*3/uL — ABNORMAL HIGH (ref 0.00–0.07)
Basophils Absolute: 0.1 10*3/uL (ref 0.0–0.1)
Basophils Relative: 1 %
Eosinophils Absolute: 0.1 10*3/uL (ref 0.0–0.5)
Eosinophils Relative: 1 %
HCT: 44 % (ref 39.0–52.0)
Hemoglobin: 14.5 g/dL (ref 13.0–17.0)
Immature Granulocytes: 8 %
Lymphocytes Relative: 13 %
Lymphs Abs: 3 10*3/uL (ref 0.7–4.0)
MCH: 31 pg (ref 26.0–34.0)
MCHC: 33 g/dL (ref 30.0–36.0)
MCV: 94 fL (ref 80.0–100.0)
Monocytes Absolute: 1.9 10*3/uL — ABNORMAL HIGH (ref 0.1–1.0)
Monocytes Relative: 8 %
Neutro Abs: 16.6 10*3/uL — ABNORMAL HIGH (ref 1.7–7.7)
Neutrophils Relative %: 69 %
Platelets: 297 10*3/uL (ref 150–400)
RBC: 4.68 MIL/uL (ref 4.22–5.81)
RDW: 14.9 % (ref 11.5–15.5)
WBC: 23.7 10*3/uL — ABNORMAL HIGH (ref 4.0–10.5)
nRBC: 0 % (ref 0.0–0.2)

## 2020-03-26 LAB — COMPREHENSIVE METABOLIC PANEL
ALT: 17 U/L (ref 0–44)
AST: 18 U/L (ref 15–41)
Albumin: 3 g/dL — ABNORMAL LOW (ref 3.5–5.0)
Alkaline Phosphatase: 64 U/L (ref 38–126)
Anion gap: 12 (ref 5–15)
BUN: 9 mg/dL (ref 8–23)
CO2: 26 mmol/L (ref 22–32)
Calcium: 8.9 mg/dL (ref 8.9–10.3)
Chloride: 103 mmol/L (ref 98–111)
Creatinine, Ser: 0.76 mg/dL (ref 0.61–1.24)
GFR, Estimated: >60 mL/min (ref >60)
Glucose, Bld: 107 mg/dL — ABNORMAL HIGH (ref 70–99)
Potassium: 4.1 mmol/L (ref 3.5–5.1)
Sodium: 141 mmol/L (ref 135–145)
Total Bilirubin: 0.3 mg/dL (ref 0.3–1.2)
Total Protein: 6.2 g/dL — ABNORMAL LOW (ref 6.5–8.1)

## 2020-03-26 LAB — URIC ACID: Uric Acid, Serum: 6.9 mg/dL (ref 3.7–8.6)

## 2020-03-26 LAB — LACTATE DEHYDROGENASE: LDH: 158 U/L (ref 98–192)

## 2020-03-26 NOTE — Progress Notes (Signed)
[facility]  [address], [address] [place], [place] [address] Phone: [phone]  Fax: [phone]   Clinic Day:  03/27/2020  Referring physician: Lynnell Jude, MD  Chief Complaint: Brent Diaz is a 64 y.o. male with leukocytosis who is seen for 10 month assessment.  HPI: The patient was last seen in the hematology clinic on 05/26/2019 via telemedicine.  At that time, he felt "the same". He denied any B symptoms. He bruised on Eliquis. We discussed plans for repeating flow cytometry at next visit due to prior phenotypic abnormality. He was to follow up in 3 months.  The patient underwent Moh's surgery by Dr. 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  During the interim, he has been fine. He is still on Eliquis and bruises easily on his elbows and hands. He wears braces on his elbows at night because the skin would peel back if it rubbed on the sheets.   The patient had recent weeping wounds on his legs which were treated by wound care. He had MRSA and another infection; he was on Cipro for 5 weeks. He used to see a Paediatric nurse at [facility]. He was supposed to follow up with her in August but with everything going on with his legs, he did not get a chance to see her.  His Crohns's disease has been doing well. He switched from Humira to budesonide in 2018.   Past Medical History:  Diagnosis Date  . Cancer ([place])    skin  . Crohn's disease ([place])   . GERD (gastroesophageal reflux disease)   . Hypertension   . MRSA (methicillin resistant staph aureus) culture positive    cultured during sinus surgery 05/2017  . Neutrophilia   . Pulmonary embolism ([place]) 10/2017  . Shingles    resolved  . Wears contact lenses    Left eye    Past Surgical History:  Procedure Laterality Date  . ABDOMINAL SURGERY    . BOWEL RESECTION     x 3  . CATARACT EXTRACTION W/PHACO Left 10/18/2018   Procedure: CATARACT EXTRACTION PHACO AND  INTRAOCULAR LENS PLACEMENT ([place]) LEFT;  Surgeon: Eulogio Bear, MD;  Location: [place];  Service: Ophthalmology;  Laterality: Left;  . CATARACT EXTRACTION W/PHACO Right 12/06/2018   Procedure: CATARACT EXTRACTION PHACO AND INTRAOCULAR LENS PLACEMENT ([facility]) RIGHT;  Surgeon: Eulogio Bear, MD;  Location: [place];  Service: Ophthalmology;  Laterality: Right;  . HYDROCELE EXCISION / REPAIR    . IMAGE GUIDED SINUS SURGERY N/A 05/21/2017   Procedure: IMAGE GUIDED SINUS SURGERY;  Surgeon: Margaretha Sheffield, MD;  Location: [place];  Service: ENT;  Laterality: N/A;  need stryker disk  . MAXILLARY ANTROSTOMY Right 05/21/2017   Procedure: MAXILLARY ANTROSTOMY;  Surgeon: Margaretha Sheffield, MD;  Location: [place];  Service: ENT;  Laterality: Right;  . SEPTOPLASTY N/A 05/21/2017   Procedure: SEPTOPLASTY;  Surgeon: Margaretha Sheffield, MD;  Location: [place];  Service: ENT;  Laterality: N/A;    Family History  Problem Relation Age of Onset  . Aneurysm Father 57  . Dementia Mother 13    Social History:  reports that he has been smoking cigarettes. He has a 23.50 pack-year smoking history. He has never used smokeless tobacco. He reports that he does not drink alcohol and does not use drugs. He smokes 10-15 cigarettes daily. He has smoked since he was 64yo and is not motivated to quit. He denies  any known exposure to radiation or toxins. 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 He lives in [place].  The patient is accompanied by his wife, Brent Diaz, today.  Allergies:  Allergies  Allergen Reactions  . Amlodipine Swelling    feet  . Aspirin Other (See Comments)    Peptic ulcers  . Flagyl [Metronidazole]     Gives neuropathy if taken too long     Current Medications: Current Outpatient Medications  Medication Sig Dispense Refill  . apixaban (ELIQUIS) 5 MG TABS tablet Take 1 tablet (5 mg total) by mouth 2 (two) times daily. Start  6/19 60 tablet 0  . B Complex-Folic Acid (B COMPLEX-VITAMIN B12 PO) Take 1,000 mcg by mouth daily.     . budesonide (ENTOCORT EC) 3 MG 24 hr capsule Take 6 mg by mouth daily.     . butalbital-acetaminophen-caffeine (FIORICET) 50-325-40 MG tablet Take 1 tablet by mouth every 4 (four) hours as needed for headache.    . fluorouracil (EFUDEX) 5 % cream Apply topically.    . folic acid (FOLVITE) 1 MG tablet Take 1 mg by mouth daily.  6  . hyoscyamine (LEVSIN SL) 0.125 MG SL tablet Place 0.125 mg under the tongue every 4 (four) hours as needed.    Marland Kitchen LISINOPRIL-HYDROCHLOROTHIAZIDE PO Take 1 tablet by mouth daily. 5 mg daily    . loperamide (IMODIUM) 2 MG capsule Take 2 mg by mouth as needed for diarrhea or loose stools.    . Melatonin 5 MG TABS Take 10 mg by mouth at bedtime as needed.    . montelukast (SINGULAIR) 10 MG tablet Take 10 mg by mouth every morning.     . nystatin (MYCOSTATIN) 100000 UNIT/ML suspension Swish and swallow 5 mLs 4 (four) times daily    . pantoprazole (PROTONIX) 40 MG tablet Take 40 mg by mouth 2 (two) times daily.  6  . potassium chloride SA (K-DUR,KLOR-CON) 20 MEQ tablet Take 1 tablet by mouth daily.     . vitamin B-12 (CYANOCOBALAMIN) 1000 MCG tablet Take 1,000 mcg by mouth daily.    . Vitamin D, Ergocalciferol, (DRISDOL) 50000 units CAPS capsule Take 50,000 Units by mouth every 7 (seven) days.    . ondansetron (ZOFRAN ODT) 4 MG disintegrating tablet Take 1 tablet (4 mg total) by mouth every 8 (eight) hours as needed for nausea or vomiting. (Patient not taking: Reported on 10/11/2018) 20 tablet 0  . zolpidem (AMBIEN) 10 MG tablet Take 10 mg by mouth at bedtime as needed for sleep. (Patient not taking: Reported on 03/27/2020)     No current facility-administered medications for this visit.    Review of Systems  Constitutional: Positive for weight loss (3 lbs over the past year). Negative for chills, diaphoresis, fever and malaise/fatigue.  HENT: Negative.  Negative for  congestion, ear discharge, ear pain, hearing loss, nosebleeds, sinus pain, sore throat and tinnitus.   Eyes: Negative for blurred vision, double vision and pain.       S/p bilateral cataract surgery.  Respiratory: Negative.  Negative for cough, hemoptysis, sputum production and shortness of breath.   Cardiovascular: Negative.  Negative for chest pain, palpitations, orthopnea and leg swelling.  Gastrointestinal: Negative for abdominal pain, blood in stool, constipation, diarrhea, heartburn, melena, nausea and vomiting.       No recent Crohn's flares.  Genitourinary: Negative.  Negative for dysuria, frequency, hematuria and urgency.  Musculoskeletal: Negative.  Negative for back pain, joint pain, myalgias and neck pain.  Skin: Negative.  Negative for itching and rash.  Neurological: Negative.  Negative for dizziness, tingling, sensory change, weakness and headaches.  Endo/Heme/Allergies: Bruises/bleeds easily (bruises on his elbows on Eliquis).  Psychiatric/Behavioral: Negative.  Negative for depression and memory loss. The patient is not nervous/anxious and does not have insomnia.   All other systems reviewed and are negative.  Performance status (ECOG): 1  Physical Exam Vitals and nursing note reviewed.  Constitutional:      General: He is not in acute distress.    Appearance: He is well-developed. He is not diaphoretic.  HENT:     Head: Normocephalic and atraumatic.     Comments: Gray hair.    Mouth/Throat:     Mouth: Mucous membranes are moist.     Pharynx: Oropharynx is clear.  Eyes:     General: No scleral icterus.    Extraocular Movements: Extraocular movements intact.     Conjunctiva/sclera: Conjunctivae normal.     Pupils: Pupils are equal, round, and reactive to light.     Comments: Glasses.  Blue eyes.  Cardiovascular:     Rate and Rhythm: Normal rate and regular rhythm.     Heart sounds: Normal heart sounds. No murmur heard.   Pulmonary:     Effort: Pulmonary effort  is normal. No respiratory distress.     Breath sounds: Normal breath sounds. No wheezing or rales.  Chest:     Chest wall: No tenderness.  Breasts:     Right: No axillary adenopathy or supraclavicular adenopathy.     Left: No axillary adenopathy or supraclavicular adenopathy.    Abdominal:     General: Bowel sounds are normal. There is no distension.     Palpations: Abdomen is soft. There is no hepatomegaly, splenomegaly or mass.     Tenderness: There is no abdominal tenderness. There is no guarding or rebound.  Musculoskeletal:        General: No swelling or tenderness. Normal range of motion.     Cervical back: Normal range of motion and neck supple.     Right lower leg: Edema (trace ankle) present.     Left lower leg: Edema (trace ankle) present.  Lymphadenopathy:     Head:     Right side of head: No preauricular, posterior auricular or occipital adenopathy.     Left side of head: No preauricular, posterior auricular or occipital adenopathy.     Cervical: No cervical adenopathy.     Upper Body:     Right upper body: No supraclavicular or axillary adenopathy.     Left upper body: No supraclavicular or axillary adenopathy.     Lower Body: No right inguinal adenopathy. No left inguinal adenopathy.  Skin:    General: Skin is warm and dry.  Neurological:     Mental Status: He is alert and oriented to person, place, and time.  Psychiatric:        Behavior: Behavior normal.        Thought Content: Thought content normal.        Judgment: Judgment normal.     Appointment on 03/26/2020  Component Date Value Ref Range Status  . LDH 03/26/2020 158  98 - 192 U/L Final   Performed at [facility], [address], [place], [place] [address]  . Uric Acid, Serum 03/26/2020 6.9  3.7 - 8.6 mg/dL Final   Performed at [facility], [address], [place], [place] [address]  . Sodium 03/26/2020 141  135 -  145 mmol/L Final  . Potassium 03/26/2020 4.1  3.5 - 5.1  mmol/L Final  . Chloride 03/26/2020 103  98 - 111 mmol/L Final  . CO2 03/26/2020 26  22 - 32 mmol/L Final  . Glucose, Bld 03/26/2020 107* 70 - 99 mg/dL Final   Glucose reference range applies only to samples taken after fasting for at least 8 hours.  . BUN 03/26/2020 9  8 - 23 mg/dL Final  . Creatinine, Ser 03/26/2020 0.76  0.61 - 1.24 mg/dL Final  . Calcium 03/26/2020 8.9  8.9 - 10.3 mg/dL Final  . Total Protein 03/26/2020 6.2* 6.5 - 8.1 g/dL Final  . Albumin 03/26/2020 3.0* 3.5 - 5.0 g/dL Final  . AST 03/26/2020 18  15 - 41 U/L Final  . ALT 03/26/2020 17  0 - 44 U/L Final  . Alkaline Phosphatase 03/26/2020 64  38 - 126 U/L Final  . Total Bilirubin 03/26/2020 0.3  0.3 - 1.2 mg/dL Final  . GFR, Estimated 03/26/2020 >60  >60 mL/min Final   Comment: (NOTE) Calculated using the CKD-EPI Creatinine Equation (2021)   . Anion gap 03/26/2020 12  5 - 15 Final   Performed at [facility], [address], [place], [place] [address]  . WBC 03/26/2020 23.7* 4.0 - 10.5 K/uL Final  . RBC 03/26/2020 4.68  4.22 - 5.81 MIL/uL Final  . Hemoglobin 03/26/2020 14.5  13.0 - 17.0 g/dL Final  . HCT 03/26/2020 44.0  39 - 52 % Final  . MCV 03/26/2020 94.0  80.0 - 100.0 fL Final  . MCH 03/26/2020 31.0  26.0 - 34.0 pg Final  . MCHC 03/26/2020 33.0  30.0 - 36.0 g/dL Final  . RDW 03/26/2020 14.9  11.5 - 15.5 % Final  . Platelets 03/26/2020 297  150 - 400 K/uL Final  . nRBC 03/26/2020 0.0  0.0 - 0.2 % Final  . Neutrophils Relative % 03/26/2020 69  % Final  . Neutro Abs 03/26/2020 16.6* 1.7 - 7.7 K/uL Final  . Lymphocytes Relative 03/26/2020 13  % Final  . Lymphs Abs 03/26/2020 3.0  0.7 - 4.0 K/uL Final  . Monocytes Relative 03/26/2020 8  % Final  . Monocytes Absolute 03/26/2020 1.9* 0.1 - 1.0 K/uL Final  . Eosinophils Relative 03/26/2020 1  % Final  . Eosinophils Absolute 03/26/2020 0.1  0.0 - 0.5 K/uL Final  . Basophils Relative 03/26/2020 1  % Final  . Basophils Absolute 03/26/2020 0.1  0.0 -  0.1 K/uL Final  . Immature Granulocytes 03/26/2020 8  % Final  . Abs Immature Granulocytes 03/26/2020 1.92* 0.00 - 0.07 K/uL Final   Performed at [facility], [address] [place], [place] [address]    Assessment:  Brent Diaz is a 64 y.o. 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  WBC has ranged between 13,400 - 25,700 without trend. He has Crohn's disease and a history of sinusitis.  He smokes.  Work-up on 11/09/2017 revealed a WBC 20,100, ANC 17,100, hemoglobin 15.7, hematocrit 47.1, and platelets 251,000.  Normal studies included:  BCR-ABL and JAK2 V617F.  Flow cytometry revealed neutrophilia with slightly left-shifted maturation and monocytosis with rare phenotypic aberrancy.  Peripheral smear was unremarkable. CRP was 4.77 (elevated).   Bone marrow biopsy on 04/05/2018 revealed a normocellular bone marrow for age with trilineage hematopoiesis. Peripheral blood showed leukocytosis with left shifted neutrophilia and monocytosis. Cytogenetics was normal (46, XY).   Monocyte count has ranged between 1400 - 3200 since 06/30/2016.  Monocytes have been 7-16% of WBCs.  He has a history of pulmonary embolism in 10/2017.  He is on Eliquis.  He has Crohn's disease.  He switched from Humira to budesonide in 2018.  Symptomatically, he has been "fine".  He had recent weeping wounds on his legs which were treated by wound care. He had MRSA and another infection; he was on Cipro for 5 weeks. His Crohns's disease has been doing well.   Plan: 1.   Labs today: CBC with diff, CMP, LDH, uric acid, flow cytometry. 2.   Leukocytosis             WBC 23,700 with an ANC of 16,600.  Monocyte count 1900.           Etiology is felt multifactorial and related to infections, Crohn's disease with smoking.  Clinically he is doing well.  He has had a recent MRSA wound infection  Discuss plans to repeat flow cytometry secondary to prior rare phenotypic aberrancy.  Continue to  monitor. 3.   Monocytosis             Patient has had a monocytosis ranging between 1400 - 3200 (7-16% of WBCs).             Previous concern for possible CMML, but not consistently > 10% of WBC.             Etiology possibly secondary to inflammatory bowel disease.             Differential includes infections, CML/AML (r/o by marrow and negative BCR-ABL), Hodgkin's, and steroids.   Repeat flow cytometry as noted above given prior phenotypic abnormality.        4.   Pulmonary embolism  Hypercoagulable work-up today: factor V Leiden, prothrombin gene mutation, lupus anticoagulant panel, beta-2 glycoprotein, anti-cardiolipin antibodies, and ATIII.  Continue Eliquis. 5.   RN call patient with flow cytometry results. 6.   RN to call patient with lupus anticoagulant results (and possibly schedule repeating off Eliquis). 7.   RTC in 3 months for MD assess and labs (CBC with diff, CMP, LDH, uric acid), and review of hypercoagulable work-up.  I discussed the assessment and treatment plan with the patient.  The patient was provided an opportunity to ask questions and all were answered.  The patient agreed with the plan and demonstrated an understanding of the instructions.  The patient was advised to call back if the symptoms worsen or if the condition fails to improve as anticipated.  I provided 18 minutes of face-to-face time during this this encounter and > 50% was spent counseling as documented under my assessment and plan.  An additional 5 minutes were spent reviewing his chart (Epic and Care Everywhere) including notes, labs, and imaging studies.    Lequita Asal, MD, PhD    03/27/2020, 11:04 AM  I, Mirian Mo Tufford, am acting as Education administrator for [employer]. Mike Gip, MD, PhD.  I, Renesmay Nesbitt C. Mike Gip, MD, have reviewed the above documentation for accuracy and completeness, and I agree with the above.

## 2020-03-27 ENCOUNTER — Encounter: Payer: Self-pay | Admitting: Hematology and Oncology

## 2020-03-27 ENCOUNTER — Inpatient Hospital Stay: Payer: Medicare PPO | Admitting: Hematology and Oncology

## 2020-03-27 ENCOUNTER — Inpatient Hospital Stay: Payer: Medicare PPO

## 2020-03-27 VITALS — BP 140/89 | HR 78 | Temp 97.4°F | Resp 18 | Wt 171.6 lb

## 2020-03-27 DIAGNOSIS — I2782 Chronic pulmonary embolism: Secondary | ICD-10-CM | POA: Diagnosis not present

## 2020-03-27 DIAGNOSIS — D72828 Other elevated white blood cell count: Secondary | ICD-10-CM | POA: Diagnosis not present

## 2020-03-27 DIAGNOSIS — D72821 Monocytosis (symptomatic): Secondary | ICD-10-CM | POA: Diagnosis not present

## 2020-03-27 DIAGNOSIS — D72829 Elevated white blood cell count, unspecified: Secondary | ICD-10-CM | POA: Diagnosis not present

## 2020-03-27 NOTE — Progress Notes (Signed)
Pt here for follow up. No new concerns voiced.   

## 2020-03-28 LAB — COMP PANEL: LEUKEMIA/LYMPHOMA

## 2020-03-29 LAB — ANTITHROMBIN PANEL
AT III AG PPP IMM-ACNC: 78 % (ref 72–124)
Antithrombin Activity: 116 % (ref 75–135)

## 2020-03-29 LAB — BETA-2-GLYCOPROTEIN I ABS, IGG/M/A
Beta-2 Glyco I IgG: <9 GPI IgG units (ref 0–20)
Beta-2-Glycoprotein I IgA: <9 GPI IgA units (ref 0–25)
Beta-2-Glycoprotein I IgM: <9 GPI IgM units (ref 0–32)

## 2020-03-30 LAB — LUPUS ANTICOAGULANT PANEL
DRVVT: 74.8 s — ABNORMAL HIGH (ref 0.0–47.0)
PTT Lupus Anticoagulant: 31.2 s (ref 0.0–51.9)

## 2020-03-30 LAB — FACTOR 5 LEIDEN

## 2020-03-30 LAB — CARDIOLIPIN ANTIBODIES, IGG, IGM, IGA
Anticardiolipin IgA: <9 APL U/mL (ref 0–11)
Anticardiolipin IgG: <9 GPL U/mL (ref 0–14)
Anticardiolipin IgM: <9 MPL U/mL (ref 0–12)

## 2020-03-30 LAB — DRVVT MIX: dRVVT Mix: 51.4 s — ABNORMAL HIGH (ref 0.0–40.4)

## 2020-03-30 LAB — DRVVT CONFIRM: dRVVT Confirm: 1.2 ratio (ref 0.8–1.2)

## 2020-04-02 LAB — PROTHROMBIN GENE MUTATION

## 2020-04-10 NOTE — Progress Notes (Signed)
[facility]  [address] [place], [place] [address] Phone: [phone]  Fax: [phone]   Clinic Day:  04/11/2020  Referring physician: Lynnell Jude, MD  Chief Complaint: Brent Diaz is a 64 y.o. male with Crohn's disease and leukocytosis who is seen for 2 week assessment.  HPI: The patient was last seen in the hematology clinic on 03/27/2020. At that time, he felt "fine".  He bruisied easily on Eliquis.  He had recent issues with weeping wounds on his legs.  Hematocrit was 44.0, hemoglobin 14.5, platelets 297,000, WBC 23,700 (ANC 16,600). Total protein was 6.2. Albumin was 3.0. LDH was 158. Uric acid was 6.9.   Hypercoagulable work-up included the following normal labs:  Factor V Leiden, prothrombin gene mutation, lupus anticoagulant panel, anti-cardiolipin antibodies, beta-2-glycoprotein antibodies, and antithrombin panel.  Flow cytometry revealed neutrophilia with left-shifted maturation. There was absolute monocytosis with phenotypic aberrancy. No other significant diagnostic immunophenotypic abnormality was detected. Monocytes showed aberrant expression of CD56, a finding that can be seen in association with both reactive/activated processes as well as neoplastic processes.  During the interim, he has been "ok". He bruises easily on Eliquis. The skin on his legs is thin but healing well. His legs are weak. He has had thick, clear drainage in his throat for the past 8 days. He started taking Mucinex every 4 hours yesterday, which has helped. He sometimes has to strain to have bowel movements. He takes Gas-X prn. He denies fevers.  He agrees to a bone marrow in the New Year. He prefers the middle of January.   Past Medical History:  Diagnosis Date  . Cancer ([place])    skin  . Crohn's disease ([place])   . GERD (gastroesophageal reflux disease)   . Hypertension   . MRSA (methicillin resistant staph aureus) culture positive    cultured during  sinus surgery 05/2017  . Neutrophilia   . Pulmonary embolism ([place]) 10/2017  . Shingles    resolved  . Wears contact lenses    Left eye    Past Surgical History:  Procedure Laterality Date  . ABDOMINAL SURGERY    . BOWEL RESECTION     x 3  . CATARACT EXTRACTION W/PHACO Left 10/18/2018   Procedure: CATARACT EXTRACTION PHACO AND INTRAOCULAR LENS PLACEMENT ([place]) LEFT;  Surgeon: Eulogio Bear, MD;  Location: [place];  Service: Ophthalmology;  Laterality: Left;  . CATARACT EXTRACTION W/PHACO Right 12/06/2018   Procedure: CATARACT EXTRACTION PHACO AND INTRAOCULAR LENS PLACEMENT ([facility]) RIGHT;  Surgeon: Eulogio Bear, MD;  Location: [place];  Service: Ophthalmology;  Laterality: Right;  . HYDROCELE EXCISION / REPAIR    . IMAGE GUIDED SINUS SURGERY N/A 05/21/2017   Procedure: IMAGE GUIDED SINUS SURGERY;  Surgeon: Margaretha Sheffield, MD;  Location: [place];  Service: ENT;  Laterality: N/A;  need stryker disk  . MAXILLARY ANTROSTOMY Right 05/21/2017   Procedure: MAXILLARY ANTROSTOMY;  Surgeon: Margaretha Sheffield, MD;  Location: [place];  Service: ENT;  Laterality: Right;  . SEPTOPLASTY N/A 05/21/2017   Procedure: SEPTOPLASTY;  Surgeon: Margaretha Sheffield, MD;  Location: [place];  Service: ENT;  Laterality: N/A;    Family History  Problem Relation Age of Onset  . Aneurysm Father 31  . Dementia Mother 44    Social History:  reports that he has been smoking cigarettes. He has a 23.50 pack-year smoking history. He has never used smokeless tobacco. He reports that he does not drink alcohol  and does not use drugs. He smokes 10-15 cigarettes daily. He has smoked since he was 64yo and is not motivated to quit. He denies any known exposure to radiation or toxins. 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 He lives in [place].  The patient is accompanied by his wife, Hassan Rowan, today.  Allergies:  Allergies  Allergen Reactions  .  Amlodipine Swelling    feet  . Aspirin Other (See Comments)    Peptic ulcers  . Flagyl [Metronidazole]     Gives neuropathy if taken too long     Current Medications: Current Outpatient Medications  Medication Sig Dispense Refill  . apixaban (ELIQUIS) 5 MG TABS tablet Take 1 tablet (5 mg total) by mouth 2 (two) times daily. Start 6/19 60 tablet 0  . B Complex-Folic Acid (B COMPLEX-VITAMIN B12 PO) Take 1,000 mcg by mouth daily.     . budesonide (ENTOCORT EC) 3 MG 24 hr capsule Take 6 mg by mouth daily.     . butalbital-acetaminophen-caffeine (FIORICET) 50-325-40 MG tablet Take 1 tablet by mouth every 4 (four) hours as needed for headache.    . fluorouracil (EFUDEX) 5 % cream Apply topically.    . folic acid (FOLVITE) 1 MG tablet Take 1 mg by mouth daily.  6  . hyoscyamine (LEVSIN SL) 0.125 MG SL tablet Place 0.125 mg under the tongue every 4 (four) hours as needed.    Marland Kitchen LISINOPRIL-HYDROCHLOROTHIAZIDE PO Take 1 tablet by mouth daily. 5 mg daily    . loperamide (IMODIUM) 2 MG capsule Take 2 mg by mouth as needed for diarrhea or loose stools.    . Melatonin 5 MG TABS Take 10 mg by mouth at bedtime as needed.    . montelukast (SINGULAIR) 10 MG tablet Take 10 mg by mouth every morning.     . nystatin (MYCOSTATIN) 100000 UNIT/ML suspension Swish and swallow 5 mLs 4 (four) times daily    . pantoprazole (PROTONIX) 40 MG tablet Take 40 mg by mouth 2 (two) times daily.  6  . potassium chloride SA (K-DUR,KLOR-CON) 20 MEQ tablet Take 1 tablet by mouth daily.     . vitamin B-12 (CYANOCOBALAMIN) 1000 MCG tablet Take 1,000 mcg by mouth daily.    . Vitamin D, Ergocalciferol, (DRISDOL) 50000 units CAPS capsule Take 50,000 Units by mouth every 7 (seven) days.    . ondansetron (ZOFRAN ODT) 4 MG disintegrating tablet Take 1 tablet (4 mg total) by mouth every 8 (eight) hours as needed for nausea or vomiting. (Patient not taking: Reported on 10/11/2018) 20 tablet 0  . zolpidem (AMBIEN) 10 MG tablet Take 10 mg by  mouth at bedtime as needed for sleep. (Patient not taking: Reported on 03/27/2020)     No current facility-administered medications for this visit.    Review of Systems  Constitutional: Positive for weight loss (3 lbs). Negative for chills, diaphoresis, fever and malaise/fatigue.       Feels "fine".  HENT: Negative.  Negative for congestion, ear discharge, ear pain, hearing loss, nosebleeds, sinus pain, sore throat and tinnitus.   Eyes: Negative for blurred vision, double vision and pain.       S/p bilateral cataract surgery.  Respiratory: Negative.  Negative for cough, hemoptysis, sputum production and shortness of breath.   Cardiovascular: Negative.  Negative for chest pain, palpitations, orthopnea and leg swelling.  Gastrointestinal: Negative for abdominal pain, blood in stool, constipation, diarrhea, heartburn, melena, nausea and vomiting.       No  recent Crohn's flares. Sometimes has to strain to have a bowel movement. Uses Gas-X prn.  Genitourinary: Negative.  Negative for dysuria, frequency, hematuria and urgency.  Musculoskeletal: Negative.  Negative for back pain, joint pain, myalgias and neck pain.  Skin: Negative.  Negative for itching and rash.  Neurological: Positive for weakness (leg weakness, improving). Negative for dizziness, tingling, sensory change and headaches.  Endo/Heme/Allergies: Bruises/bleeds easily (bruises on his elbows on Eliquis).  Psychiatric/Behavioral: Negative.  Negative for depression and memory loss. The patient is not nervous/anxious and does not have insomnia.   All other systems reviewed and are negative.  Performance status (ECOG): 1  Blood pressure (!) 159/93, pulse 91, temperature 97.8 F (36.6 C), resp. rate 20, weight 168 lb 10.4 oz (76.5 kg), SpO2 99 %.   Physical Exam Vitals and nursing note reviewed.  Constitutional:      General: He is not in acute distress.    Appearance: He is well-developed. He is not diaphoretic.  Eyes:      General: No scleral icterus.    Conjunctiva/sclera: Conjunctivae normal.     Comments: Glasses.  Blue eyes.  Neurological:     Mental Status: He is alert and oriented to person, place, and time.  Psychiatric:        Behavior: Behavior normal.        Thought Content: Thought content normal.        Judgment: Judgment normal.    No visits with results within 3 Day(s) from this visit.  Latest known visit with results is:  Office Visit on 03/27/2020  Component Date Value Ref Range Status  . Anticardiolipin IgG 03/27/2020 <9  0 - 14 GPL U/mL Final   Comment: (NOTE)                          Negative:              <15                          Indeterminate:     15 - 20                          Low-Med Positive: >20 - 80                          High Positive:         >80   . Anticardiolipin IgM 03/27/2020 <9  0 - 12 MPL U/mL Final   Comment: (NOTE)                          Negative:              <13                          Indeterminate:     13 - 20                          Low-Med Positive: >20 - 80                          High Positive:         >80   . Anticardiolipin IgA 03/27/2020 <9  0 -  11 APL U/mL Final   Comment: (NOTE)                          Negative:              <12                          Indeterminate:     12 - 20                          Low-Med Positive: >20 - 80                          High Positive:         >80 Performed At: [facility] [place], [place] [id number] Rush Farmer MD [id number]   . Antithrombin Activity 03/27/2020 116  75 - 135 % Final   Comment: (NOTE) Direct Xa inhibitor anticoagulants such as rivaroxaban, apixaban and edoxaban will lead to spuriously elevated antithrombin activity levels possibly masking a deficiency.   . AT III AG PPP IMM-ACNC 03/27/2020 78  72 - 124 % Final   Comment: (NOTE) This test was developed and its performance characteristics determined by [facility]. It has not been cleared or  approved by the Food and Drug Administration. Performed At: [facility] [place], [place] [id number] Rush Farmer MD GQ:[id number]   . Beta-2 Glyco I IgG 03/27/2020 <9  0 - 20 GPI IgG units Final   Comment: (NOTE) The reference interval reflects a 3SD or 99th percentile interval, which is thought to represent a potentially clinically significant result in accordance with the International Consensus Statement on the classification criteria for definitive antiphospholipid syndrome (APS). J Thromb Haem 2006;4:295-306.   . Beta-2-Glycoprotein I IgM 03/27/2020 <9  0 - 32 GPI IgM units Final   Comment: (NOTE) The reference interval reflects a 3SD or 99th percentile interval, which is thought to represent a potentially clinically significant result in accordance with the International Consensus Statement on the classification criteria for definitive antiphospholipid syndrome (APS). J Thromb Haem 2006;4:295-306. Performed At: [facility] [place], [place] [id number] Rush Farmer MD [id number]   . Beta-2-Glycoprotein I IgA 03/27/2020 <9  0 - 25 GPI IgA units Final   Comment: (NOTE) The reference interval reflects a 3SD or 99th percentile interval, which is thought to represent a potentially clinically significant result in accordance with the International Consensus Statement on the classification criteria for definitive antiphospholipid syndrome (APS). J Thromb Haem 2006;4:295-306.   Marland Kitchen PTT Lupus Anticoagulant 03/27/2020 31.2  0.0 - 51.9 sec Final  . DRVVT 03/27/2020 74.8* 0.0 - 47.0 sec Final  . Lupus Anticoag Interp 03/27/2020 Comment:   Corrected   Comment: (NOTE) No lupus anticoagulant was detected. These results are consistent with specific inhibitors to one or more common pathway factors (X, V, II or fibrinogen). Performed At: [facility] [place], [place] [id number] Rush Farmer MD KW:[id number]    . Recommendations-PTGENE: 03/27/2020 Comment   Final   Comment: (NOTE) NEGATIVE No mutation identified. Comment: A point mutation (G20210A) in the factor II (prothrombin) gene is the second most common cause of inherited thrombophilia. The incidence of this mutation in the [place] Caucasian population is about 2% and in the [place] American population it is approximately 0.5%. This mutation is rare in  the [place] and Native American population. Being heterozygous for a prothrombin mutation increases the risk for developing venous thrombosis about 2 to 3 times above the general population risk. Being homozygous for the prothrombin gene mutation increases the relative risk for venous thrombosis further, although it is not yet known how much further the risk is increased. In women heterozygous for the prothrombin gene mutation, the use of estrogen containing oral contraceptives increases the relative risk of venous thrombosis about 16 times and the risk of developing cerebral thrombosis is also significantly increased. In pregnancy the pr                          othrombin gene mutation increases risk for venous thrombosis and may increase risk for stillbirth, placental abruption, pre-eclampsia and fetal growth restriction. If the patient possesses two or more congenital or acquired thrombophilic risk factors, the risk for thrombosis may rise to more than the sum of the risk ratios for the individual mutations. This assay detects only the prothrombin G20210A mutation and does not measure genetic abnormalities elsewhere in the genome. Other thrombotic risk factors may be pursued through systematic clinical laboratory analysis. These factors include the R506Q (Leiden) mutation in the Factor V gene, plasma homocysteine levels, as well as testing for deficiencies of antithrombin III, protein C and protein S. Genetic Counselors are available for health care providers to discuss results at  1-800-345-GENE [phone]). Methodology: DNA analysis of the Factor II gene was performed by PCR amplification followed by restriction analysis. The di                          agnostic sensitivity is >99% for both. All the tests must be combined with clinical information for the most accurate interpretation. Molecular-based testing is highly accurate, but as in any laboratory test, diagnostic errors may occur. This test was developed and its performance characteristics determined by [facility]. It has not been cleared or approved by the Food and Drug Administration. Poort SR, et al. Blood. 1996; 85:9292-4462. Varga EA. Circulation. 2004; 863:O17-R11. Mervin Hack, et [place]; 19:700-703. Allison Quarry, PhD, [facility] Ruben Reason, PhD, [facility] Earlean Polka, PhD, [facility] Threasa Alpha, PhD, [facility] Ileene Hutchinson, PhD, [facility] Alfredo Bach, PhD, [facility] Performed At: [facility] [address] [place], [place] [id number] Katina Degree MDPhD XO:[id number]   . Recommendations-F5LEID: 03/27/2020 Comment   Final   Comment: (NOTE) Result:  Negative (no mutation found) Factor V Leiden is a specific mutation (R506Q) in the factor V gene that is associated with an increased risk of venous thrombosis. Factor V Leiden is more resistant to inactivation by activated protein C.  As a result, factor V persists in the circulation leading to a mild hyper- coagulable state.  The Leiden mutation accounts for 90% - 95% of APC resistance.  Factor V Leiden has been reported in patients with deep vein thrombosis, pulmonary embolus, central retinal vein occlusion, cerebral sinus thrombosis and hepatic vein thrombosis. Other risk factors to be considered in the workup for venous thrombosis include the G20210A mutation in the factor II (prothrombin) gene, protein S and C deficiency, and antithrombin deficiencies. Anticardiolipin antibody and lupus anticoagulant  analysis may be appropriate for certain patients, as well as homocysteine levels. Contact your local [facility] for information on how to order additi  onal testing if desired. **Genetic counselors are available for health care providers to**  discuss results at 1-800-345-GENE [phone]). Methodology: DNA analysis of the Factor V gene was performed by allele-specific PCR. The diagnostic sensitivity and specificity is >99% for both. Molecular-based testing is highly accurate, but as in any laboratory test, diagnostic errors may occur. All test results must be combined with clinical information for the most accurate interpretation. This test was developed and its performance characteristics determined by [facility]. It has not been cleared or approved by the Food and Drug Administration. References: Voelkerding K (1996).  Clin Lab Med [phone]. Allison Quarry, PhD, [facility] Ruben Reason, PhD, [facility] Earlean Polka, PhD, [facility] Threasa Alpha, PhD, [facility] Ileene Hutchinson, PhD, [facility] Alfredo Bach, PhD, [facility] Performed At: [facility] [address] [place], [place] [id number] Katina Degree MDPhD VV:[id number]   . dRVVT Mix 03/27/2020 51.4* 0.0 - 40.4 sec Final   Comment: (NOTE) Performed At: [facility] [place], [place] [id number] Rush Farmer MD FH:[id number]   . dRVVT Confirm 03/27/2020 1.2  0.8 - 1.2 ratio Final   Comment: (NOTE) Performed At: [facility] [place], [place] [id number] Rush Farmer MD XE:[id number]     Assessment:  KELON EASOM is a 64 y.o. male with Crohn's disease and reactive leukocytosis since at least 02/2014.  WBC has ranged between 13,400 - 25,700 without trend.  Monocyte count has ranged between 1400 - 3200 since 06/30/2016.  Monocytes have been 7-16% of WBCs.  He has a history of sinusitis.  He smokes.  Work-up on 11/09/2017 revealed a WBC 20,100, ANC 17,100, hemoglobin  15.7, hematocrit 47.1, and platelets 251,000.  Normal studies included:  BCR-ABL and JAK2 V617F.  Flow cytometry revealed neutrophilia with slightly left-shifted maturation and monocytosis with rare phenotypic aberrancy.  Peripheral smear was unremarkable. CRP was 4.77 (elevated).   Bone marrow biopsy on 04/05/2018 revealed a normocellular bone marrow for age with trilineage hematopoiesis. Peripheral blood showed leukocytosis with left shifted neutrophilia and monocytosis. Cytogenetics was normal (46, XY).   JAK2 V617F, exon 12-15, CALR, and MPL were negative on 10/14/2018.  Flow cytometry on 03/27/2020 revealed neutrophilia with left-shifted maturation. There was absolute monocytosis with phenotypic aberrancy. No other significant diagnostic immunophenotypic abnormality was detected. Monocytes showed aberrant expression of CD56, a finding that can be seen in association with both reactive/activated processes as well as neoplastic processes.  He has a history of pulmonary embolism in 10/2017.  Hypercoagulable work-up included the following normal labs:  Factor V Leiden, prothrombin gene mutation, lupus anticoagulant panel, anti-cardiolipin antibodies, beta-2-glycoprotein antibodies, and antithrombin III panel.  He is on Eliquis.    He has Crohn's disease.  He switched from Humira to budesonide in 2018.  Symptomatically, he feels "fine".  He denies any fevers.  Crohn's disease is quiescent.  Exam reveals no adenopathy or hepatosplenomegaly.  Plan: 1.   Review labs from 03/27/2020. 2.   Leukocytosis             Etiology felt initially reactive.  Flow cytometry notes monocytosis with phenotypic aberrancy.           Discuss plan for bone marrow aspirate and biopsy.   Patient wishes to wait until after the New Year.  Continue to monitor. 3.   Monocytosis             Patient has had a monocytosis ranging between 1400 - 3200 (7-16% of WBCs).  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             Etiology possibly secondary to inflammatory bowl disease.             Differential diagnosis includes infections, CML/AML (r/o by marrow and negative BCR-ABL), Hodgkin's, and steroids.   Flow cytometry on 03/27/2020 revealed monocytosis with a phenotypic aberrancy.   Etiology secondary to neoplastic and reactive processes.  Bone marrow planned.     4.   Pulmonary embolism  Patient has a history of PE in 10/2017.  Hypercoagulable work-up reviewed.  Continue Eliquis. 5.   Bone marrow aspirate and biopsy on 05/21/2020. 6.   RTC 10 days after bone marrow for MD assessment and discussion regarding direction of therapy.  I discussed the assessment and treatment plan with the patient.  The patient was provided an opportunity to ask questions and all were answered.  The patient agreed with the plan and demonstrated an understanding of the instructions.  The patient was advised to call back if the symptoms worsen or if the condition fails to improve as anticipated.  I provided 18 minutes of face-to-face time during this this encounter and > 50% was spent counseling as documented under my assessment and plan.  An additional 5 minutes were spent reviewing her chart (Epic and Care Everywhere) including notes, labs, and imaging studies.    Lequita Asal, MD, PhD    04/11/2020, 3:33 PM  I, Mirian Mo Tufford, am acting as Education administrator for [employer]. Mike Gip, MD, PhD.  I, Francess Mullen C. Mike Gip, MD, have reviewed the above documentation for accuracy and completeness, and I agree with the above.

## 2020-04-11 ENCOUNTER — Other Ambulatory Visit: Payer: Self-pay

## 2020-04-11 ENCOUNTER — Encounter: Payer: Self-pay | Admitting: Hematology and Oncology

## 2020-04-11 ENCOUNTER — Inpatient Hospital Stay: Payer: Medicare PPO | Attending: Hematology and Oncology | Admitting: Hematology and Oncology

## 2020-04-11 VITALS — BP 159/93 | HR 91 | Temp 97.8°F | Resp 20 | Wt 168.7 lb

## 2020-04-11 DIAGNOSIS — Z86711 Personal history of pulmonary embolism: Secondary | ICD-10-CM | POA: Insufficient documentation

## 2020-04-11 DIAGNOSIS — I2782 Chronic pulmonary embolism: Secondary | ICD-10-CM

## 2020-04-11 DIAGNOSIS — I10 Essential (primary) hypertension: Secondary | ICD-10-CM | POA: Diagnosis not present

## 2020-04-11 DIAGNOSIS — K219 Gastro-esophageal reflux disease without esophagitis: Secondary | ICD-10-CM | POA: Insufficient documentation

## 2020-04-11 DIAGNOSIS — Z7901 Long term (current) use of anticoagulants: Secondary | ICD-10-CM | POA: Insufficient documentation

## 2020-04-11 DIAGNOSIS — D72829 Elevated white blood cell count, unspecified: Secondary | ICD-10-CM

## 2020-04-11 DIAGNOSIS — K5090 Crohn's disease, unspecified, without complications: Secondary | ICD-10-CM | POA: Diagnosis not present

## 2020-04-11 DIAGNOSIS — D72828 Other elevated white blood cell count: Secondary | ICD-10-CM | POA: Diagnosis present

## 2020-04-11 DIAGNOSIS — D72821 Monocytosis (symptomatic): Secondary | ICD-10-CM | POA: Diagnosis not present

## 2020-04-11 DIAGNOSIS — Z79899 Other long term (current) drug therapy: Secondary | ICD-10-CM | POA: Insufficient documentation

## 2020-04-11 DIAGNOSIS — Z7952 Long term (current) use of systemic steroids: Secondary | ICD-10-CM | POA: Diagnosis not present

## 2020-04-11 DIAGNOSIS — F17210 Nicotine dependence, cigarettes, uncomplicated: Secondary | ICD-10-CM | POA: Insufficient documentation

## 2020-04-17 ENCOUNTER — Other Ambulatory Visit: Payer: Self-pay

## 2020-04-19 ENCOUNTER — Telehealth: Payer: Self-pay | Admitting: Hematology and Oncology

## 2020-04-19 NOTE — Progress Notes (Signed)
Patient on schedule for BMB 04/23/20. Called and spoke with wife on phone with pre procedure instructions given. Made aware to be here @ 0730, NPO after MN, as well as driver for discharge post procedure/discharge. Stated understanding.

## 2020-04-20 ENCOUNTER — Other Ambulatory Visit: Payer: Self-pay | Admitting: Student

## 2020-04-23 ENCOUNTER — Other Ambulatory Visit: Payer: Self-pay

## 2020-04-23 ENCOUNTER — Ambulatory Visit
Admission: RE | Admit: 2020-04-23 | Discharge: 2020-04-23 | Disposition: A | Discharge status: Home / Self Care | Payer: Medicare PPO | Source: Ambulatory Visit | Attending: Hematology and Oncology | Admitting: Hematology and Oncology

## 2020-04-23 DIAGNOSIS — D72829 Elevated white blood cell count, unspecified: Secondary | ICD-10-CM | POA: Diagnosis present

## 2020-04-23 DIAGNOSIS — D72821 Monocytosis (symptomatic): Secondary | ICD-10-CM | POA: Diagnosis present

## 2020-04-23 LAB — CBC WITH DIFFERENTIAL/PLATELET
Abs Immature Granulocytes: 1.51 10*3/uL — ABNORMAL HIGH (ref 0.00–0.07)
Basophils Absolute: 0.1 10*3/uL (ref 0.0–0.1)
Basophils Relative: 0 %
Eosinophils Absolute: 0.1 10*3/uL (ref 0.0–0.5)
Eosinophils Relative: 0 %
HCT: 42.8 % (ref 39.0–52.0)
Hemoglobin: 14.2 g/dL (ref 13.0–17.0)
Immature Granulocytes: 7 %
Lymphocytes Relative: 12 %
Lymphs Abs: 2.7 10*3/uL (ref 0.7–4.0)
MCH: 31.2 pg (ref 26.0–34.0)
MCHC: 33.2 g/dL (ref 30.0–36.0)
MCV: 94.1 fL (ref 80.0–100.0)
Monocytes Absolute: 1.9 10*3/uL — ABNORMAL HIGH (ref 0.1–1.0)
Monocytes Relative: 8 %
Neutro Abs: 16.9 10*3/uL — ABNORMAL HIGH (ref 1.7–7.7)
Neutrophils Relative %: 73 %
Platelets: 400 10*3/uL (ref 150–400)
RBC: 4.55 MIL/uL (ref 4.22–5.81)
RDW: 14.8 % (ref 11.5–15.5)
Smear Review: NORMAL
WBC: 23.1 10*3/uL — ABNORMAL HIGH (ref 4.0–10.5)
nRBC: 0 % (ref 0.0–0.2)

## 2020-04-23 MED ORDER — MIDAZOLAM HCL 2 MG/2ML IJ SOLN
INTRAMUSCULAR | Status: AC
  Filled 2020-04-23: qty 2

## 2020-04-23 MED ORDER — FENTANYL CITRATE (PF) 100 MCG/2ML IJ SOLN
INTRAMUSCULAR | Status: AC | PRN
  Administered 2020-04-23 (×2): 50 ug via INTRAVENOUS

## 2020-04-23 MED ORDER — HEPARIN SOD (PORK) LOCK FLUSH 100 UNIT/ML IV SOLN
INTRAVENOUS | Status: AC
  Filled 2020-04-23: qty 5

## 2020-04-23 MED ORDER — MIDAZOLAM HCL 2 MG/2ML IJ SOLN
INTRAMUSCULAR | Status: AC | PRN
  Administered 2020-04-23 (×2): 1 mg via INTRAVENOUS

## 2020-04-23 MED ORDER — FENTANYL CITRATE (PF) 100 MCG/2ML IJ SOLN
INTRAMUSCULAR | Status: AC
  Filled 2020-04-23: qty 2

## 2020-04-23 MED ORDER — SODIUM CHLORIDE 0.9 % IV SOLN: INTRAVENOUS | Status: DC

## 2020-04-23 NOTE — H&P (Signed)
Chief Complaint: Patient was seen in consultation today for CT-guided bone marrow biopsy at the request of [place] [facility]  Referring Physician(s): [place] [facility]  Patient Status: [facility] - Out-pt  History of Present Illness: Brent Diaz is a 64 y.o. male with history of leukocytosis and monocytosis.  Previous bone marrow biopsy in 2019.  Request for another bone marrow biopsy.  Past medical history is significant for Crohn's disease.  Prior bowel surgeries.  History of lower extremity wounds.  Currently being treated for sinus infection.  Patient has no complaints today.  History of COVID-19 vaccine and booster.  Past Medical History:  Diagnosis Date  . Cancer ([place])    skin  . Crohn's disease ([place])   . GERD (gastroesophageal reflux disease)   . Hypertension   . MRSA (methicillin resistant staph aureus) culture positive    cultured during sinus surgery 05/2017  . Neutrophilia   . Pulmonary embolism ([place]) 10/2017  . Shingles    resolved  . Wears contact lenses    Left eye    Past Surgical History:  Procedure Laterality Date  . ABDOMINAL SURGERY    . BOWEL RESECTION     x 3  . CATARACT EXTRACTION W/PHACO Left 10/18/2018   Procedure: CATARACT EXTRACTION PHACO AND INTRAOCULAR LENS PLACEMENT ([place]) LEFT;  Surgeon: Eulogio Bear, MD;  Location: [place];  Service: Ophthalmology;  Laterality: Left;  . CATARACT EXTRACTION W/PHACO Right 12/06/2018   Procedure: CATARACT EXTRACTION PHACO AND INTRAOCULAR LENS PLACEMENT ([facility]) RIGHT;  Surgeon: Eulogio Bear, MD;  Location: [place];  Service: Ophthalmology;  Laterality: Right;  . HYDROCELE EXCISION / REPAIR    . IMAGE GUIDED SINUS SURGERY N/A 05/21/2017   Procedure: IMAGE GUIDED SINUS SURGERY;  Surgeon: Margaretha Sheffield, MD;  Location: [place];  Service: ENT;  Laterality: N/A;  need stryker disk  . MAXILLARY ANTROSTOMY Right 05/21/2017   Procedure: MAXILLARY ANTROSTOMY;  Surgeon: Margaretha Sheffield, MD;  Location: [place];  Service: ENT;  Laterality: Right;  . SEPTOPLASTY N/A 05/21/2017   Procedure: SEPTOPLASTY;  Surgeon: Margaretha Sheffield, MD;  Location: [place];  Service: ENT;  Laterality: N/A;    Allergies: Amlodipine, Aspirin, and Flagyl [metronidazole]  Medications: Prior to Admission medications   Medication Sig Start Date End Date Taking? Authorizing Provider  apixaban (ELIQUIS) 5 MG TABS tablet Take 1 tablet (5 mg total) by mouth 2 (two) times daily. Start 6/19 10/21/17  Yes Mody, Ulice Bold, MD  B Complex-Folic Acid (B COMPLEX-VITAMIN B12 PO) Take 1,000 mcg by mouth daily.    Yes [provider]  budesonide (ENTOCORT EC) 3 MG 24 hr capsule Take 6 mg by mouth daily.    Yes [provider]  butalbital-acetaminophen-caffeine (FIORICET) 50-325-40 MG tablet Take 1 tablet by mouth every 4 (four) hours as needed for headache.   Yes [provider]  fluorouracil (EFUDEX) 5 % cream Apply topically. 09/19/19 09/18/20 Yes [provider]  folic acid (FOLVITE) 1 MG tablet Take 1 mg by mouth daily. 06/21/16  Yes [provider]  hyoscyamine (LEVSIN SL) 0.125 MG SL tablet Place 0.125 mg under the tongue every 4 (four) hours as needed.   Yes [provider]  LISINOPRIL-HYDROCHLOROTHIAZIDE PO Take 1 tablet by mouth daily. 5 mg daily   Yes [provider]  loperamide (IMODIUM) 2 MG capsule Take 2 mg by mouth as needed for diarrhea or loose stools.   Yes [provider]  Melatonin 5 MG TABS  Take 10 mg by mouth at bedtime as needed.   Yes [provider]  montelukast (SINGULAIR) 10 MG tablet Take 10 mg by mouth every morning.    Yes [provider]  nystatin (MYCOSTATIN) 100000 UNIT/ML suspension Swish and swallow 5 mLs 4 (four) times daily 12/22/19  Yes [provider]  ondansetron (ZOFRAN ODT) 4 MG disintegrating tablet Take 1 tablet (4 mg total) by mouth every 8 (eight) hours as  needed for nausea or vomiting. 04/27/18  Yes Darel Hong, MD  pantoprazole (PROTONIX) 40 MG tablet Take 40 mg by mouth 2 (two) times daily. 04/14/16  Yes [provider]  vitamin B-12 (CYANOCOBALAMIN) 1000 MCG tablet Take 1,000 mcg by mouth daily.   Yes [provider]  Vitamin D, Ergocalciferol, (DRISDOL) 50000 units CAPS capsule Take 50,000 Units by mouth every 7 (seven) days.   Yes [provider]  potassium chloride SA (K-DUR,KLOR-CON) 20 MEQ tablet Take 1 tablet by mouth daily.  10/08/17 04/11/20  [provider]  zolpidem (AMBIEN) 10 MG tablet Take 10 mg by mouth at bedtime as needed for sleep. Patient not taking: Reported on 03/27/2020    [provider]     Family History  Problem Relation Age of Onset  . Aneurysm Father 69  . Dementia Mother 37    Social History   Socioeconomic History  . Marital status: Married    Spouse name: Not on file  . Number of children: Not on file  . Years of education: Not on file  . Highest education level: Not on file  Occupational History  . Not on file  Tobacco Use  . Smoking status: Current Every Day Smoker    Packs/day: 0.50    Years: 47.00    Pack years: 23.50    Types: Cigarettes  . Smokeless tobacco: Never Used  . Tobacco comment: since age 58  Vaping Use  . Vaping Use: Never used  Substance and Sexual Activity  . Alcohol use: No  . Drug use: Never  . Sexual activity: Not on file  Other Topics Concern  . Not on file  Social History Narrative  . Not on file   Social Determinants of Health   Financial Resource Strain: Not on file  Food Insecurity: Not on file  Transportation Needs: Not on file  Physical Activity: Not on file  Stress: Not on file  Social Connections: Not on file     Review of Systems  Constitutional: Negative.   Respiratory: Negative.   Cardiovascular: Negative.   Gastrointestinal: Negative.   Genitourinary: Negative.     Vital Signs: BP (!) 151/89    Pulse 72   Temp 98.4 F (36.9 C) (Oral)   Resp 20   Ht 5' 10"  (1.778 m)   Wt 74.8 kg   SpO2 96%   BMI 23.68 kg/m   Physical Exam Cardiovascular:     Rate and Rhythm: Normal rate and regular rhythm.     Heart sounds: Normal heart sounds.  Pulmonary:     Effort: Pulmonary effort is normal.     Breath sounds: Normal breath sounds.  Abdominal:     General: Abdomen is flat.     Palpations: Abdomen is soft.  Neurological:     Mental Status: He is alert.     Imaging: No results found.  Labs:  CBC: Recent Labs    05/25/19 0902 03/26/20 0900 04/23/20 0802  WBC 24.4* 23.7* 23.1*  HGB 15.6 14.5 14.2  HCT 47.4  44.0 42.8  PLT 279 297 400    COAGS: No results for input(s): INR, APTT in the last 8760 hours.  BMP: Recent Labs    05/25/19 0902 03/26/20 0900  NA  --  141  K 3.9 4.1  CL  --  103  CO2  --  26  GLUCOSE  --  107*  BUN  --  9  CALCIUM  --  8.9  CREATININE  --  0.76  GFRNONAA  --  >60    LIVER FUNCTION TESTS: Recent Labs    03/26/20 0900  BILITOT 0.3  AST 18  ALT 17  ALKPHOS 64  PROT 6.2*  ALBUMIN 3.0*    TUMOR MARKERS: No results for input(s): AFPTM, CEA, CA199, CHROMGRNA in the last 8760 hours.  Assessment and Plan:  64 year old with leukocytosis and monocytosis.  Request for a CT-guided bone marrow biopsy.  Risks and benefits of CT-guided bone marrow biopsy was discussed with the patient and/or patient's family including, but not limited to bleeding, infection, damage to adjacent structures or low yield requiring additional tests.  All of the questions were answered and there is agreement to proceed.  Consent signed and in chart.    Thank you for this interesting consult.  I greatly enjoyed meeting RANDLE SHATZER and look forward to participating in their care.  A copy of this report was sent to the requesting provider on this date.  Electronically Signed: Burman Riis, MD 04/23/2020, 8:47 AM   I spent a total of  10 minutes   in face to face in clinical consultation, greater than 50% of which was counseling/coordinating care for CT guided bone marrow biopsy.

## 2020-04-23 NOTE — Progress Notes (Signed)
Patient clinically stable post BMB per Dr Anselm Pancoast, tolerated well with bandade dry/intact to posterior sacral  Area. Report given to Genelle Bal Rn post procedure. Received Versed 2 mg along with Fentanyl 100 mcg IV for procedure.

## 2020-04-23 NOTE — Procedures (Signed)
Interventional Radiology Procedure:   Indications: Leukocytosis and monocytosis  Procedure: CT guided bone marrow biopsy  Findings: 2 aspirates and 1 core from right ilium  Complications: None     EBL: Minimal, less than 10 ml  Plan: Discharge to home in one hour.   Raima Geathers R. Anselm Pancoast, MD  Pager: [phone]

## 2020-04-25 LAB — SURGICAL PATHOLOGY

## 2020-05-02 ENCOUNTER — Encounter (HOSPITAL_COMMUNITY): Payer: Self-pay | Admitting: Hematology and Oncology

## 2020-05-02 NOTE — Progress Notes (Incomplete)
[facility]  [address], [address] [place], [place] [address] Phone: [phone]  Fax: [phone]   Clinic Day:  05/02/2020  Referring physician: Lynnell Jude, MD  Chief Complaint: Brent Diaz is a 64 y.o. male with leukocytosis who is seen for 3 week assessment and discussion regarding direction of therapy.  HPI: The patient was last seen in the hematology clinic on 04/11/2020. At that time, he felt "ok".  Hypercoagulable work-up was negative.  Flow cytometry revealed neutrophilia with left-shifted maturation. There was absolute monocytosis with phenotypic aberrancy. We discussed consideration of a bone marrow aspirate and biopsy.  Bone marrow on 04/23/2020 revealed normocellular marrow with mild myeloid hyperplasia and megakaryocytic atypia. The marrow is normocellular for age, but exhibited a mild myeloid hyperplasia and mild megakaryocytic atypia. There was no increase in blasts. Flow cytometry revealed a subset of monocytes with CD56 expression, which can be seen in reactive or neoplastic conditions. While the marrow changes could be seen in a reactive setting, correlation with a next generation sequencing panel was recommended to exclude a myeloid neoplasm (myeloproliferative or myeloproliferative/myelodysplastic disorder). Cytogenetics were normal (46, XY).  During the interim, ***  Past Medical History:  Diagnosis Date  . Cancer ([place])    skin  . Crohn's disease ([place])   . GERD (gastroesophageal reflux disease)   . Hypertension   . MRSA (methicillin resistant staph aureus) culture positive    cultured during sinus surgery 05/2017  . Neutrophilia   . Pulmonary embolism ([place]) 10/2017  . Shingles    resolved  . Wears contact lenses    Left eye    Past Surgical History:  Procedure Laterality Date  . ABDOMINAL SURGERY    . BOWEL RESECTION     x 3  . CATARACT EXTRACTION W/PHACO Left 10/18/2018   Procedure: CATARACT EXTRACTION PHACO AND  INTRAOCULAR LENS PLACEMENT ([place]) LEFT;  Surgeon: Eulogio Bear, MD;  Location: [place];  Service: Ophthalmology;  Laterality: Left;  . CATARACT EXTRACTION W/PHACO Right 12/06/2018   Procedure: CATARACT EXTRACTION PHACO AND INTRAOCULAR LENS PLACEMENT ([facility]) RIGHT;  Surgeon: Eulogio Bear, MD;  Location: [place];  Service: Ophthalmology;  Laterality: Right;  . HYDROCELE EXCISION / REPAIR    . IMAGE GUIDED SINUS SURGERY N/A 05/21/2017   Procedure: IMAGE GUIDED SINUS SURGERY;  Surgeon: Margaretha Sheffield, MD;  Location: [place];  Service: ENT;  Laterality: N/A;  need stryker disk  . MAXILLARY ANTROSTOMY Right 05/21/2017   Procedure: MAXILLARY ANTROSTOMY;  Surgeon: Margaretha Sheffield, MD;  Location: [place];  Service: ENT;  Laterality: Right;  . SEPTOPLASTY N/A 05/21/2017   Procedure: SEPTOPLASTY;  Surgeon: Margaretha Sheffield, MD;  Location: [facility];  Service: ENT;  Laterality: N/A;    Family History  Problem Relation Age of Onset  . Aneurysm Father 27  . Dementia Mother 73    Social History:  reports that he has been smoking cigarettes. He has a 23.50 pack-year smoking history. He has never used smokeless tobacco. He reports that he does not drink alcohol and does not use drugs. He smokes 10-15 cigarettes daily. He has smoked since he was 64yo and is not motivated to quit. He denies any known exposure to radiation or toxins. 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 He lives in [place].  The patient is accompanied by his wife, Hassan Rowan,*** today.  Allergies:  Allergies  Allergen Reactions  . Amlodipine Swelling    feet  .  Aspirin Other (See Comments)    Peptic ulcers  . Flagyl [Metronidazole]     Gives neuropathy if taken too long     Current Medications: Current Outpatient Medications  Medication Sig Dispense Refill  . apixaban (ELIQUIS) 5 MG TABS tablet Take 1 tablet (5 mg total) by mouth 2 (two) times daily.  Start 6/19 60 tablet 0  . B Complex-Folic Acid (B COMPLEX-VITAMIN B12 PO) Take 1,000 mcg by mouth daily.     . budesonide (ENTOCORT EC) 3 MG 24 hr capsule Take 6 mg by mouth daily.     . butalbital-acetaminophen-caffeine (FIORICET) 50-325-40 MG tablet Take 1 tablet by mouth every 4 (four) hours as needed for headache.    . fluorouracil (EFUDEX) 5 % cream Apply topically.    . folic acid (FOLVITE) 1 MG tablet Take 1 mg by mouth daily.  6  . hyoscyamine (LEVSIN SL) 0.125 MG SL tablet Place 0.125 mg under the tongue every 4 (four) hours as needed.    Marland Kitchen LISINOPRIL-HYDROCHLOROTHIAZIDE PO Take 1 tablet by mouth daily. 5 mg daily    . loperamide (IMODIUM) 2 MG capsule Take 2 mg by mouth as needed for diarrhea or loose stools.    . Melatonin 5 MG TABS Take 10 mg by mouth at bedtime as needed.    . montelukast (SINGULAIR) 10 MG tablet Take 10 mg by mouth every morning.     . nystatin (MYCOSTATIN) 100000 UNIT/ML suspension Swish and swallow 5 mLs 4 (four) times daily    . ondansetron (ZOFRAN ODT) 4 MG disintegrating tablet Take 1 tablet (4 mg total) by mouth every 8 (eight) hours as needed for nausea or vomiting. 20 tablet 0  . pantoprazole (PROTONIX) 40 MG tablet Take 40 mg by mouth 2 (two) times daily.  6  . potassium chloride SA (K-DUR,KLOR-CON) 20 MEQ tablet Take 1 tablet by mouth daily.     . vitamin B-12 (CYANOCOBALAMIN) 1000 MCG tablet Take 1,000 mcg by mouth daily.    . Vitamin D, Ergocalciferol, (DRISDOL) 50000 units CAPS capsule Take 50,000 Units by mouth every 7 (seven) days.    Marland Kitchen zolpidem (AMBIEN) 10 MG tablet Take 10 mg by mouth at bedtime as needed for sleep. (Patient not taking: Reported on 03/27/2020)     No current facility-administered medications for this visit.    Review of Systems  Constitutional: Positive for weight loss (3 lbs). Negative for chills, diaphoresis, fever and malaise/fatigue.  HENT: Negative.  Negative for congestion, ear discharge, ear pain, hearing loss,  nosebleeds, sinus pain, sore throat and tinnitus.   Eyes: Negative for blurred vision, double vision and pain.       S/p bilateral cataract surgery.  Respiratory: Negative.  Negative for cough, hemoptysis, sputum production and shortness of breath.   Cardiovascular: Negative.  Negative for chest pain, palpitations, orthopnea and leg swelling.  Gastrointestinal: Negative for abdominal pain, blood in stool, constipation, diarrhea, heartburn, melena, nausea and vomiting.       No recent Crohn's flares. Sometimes has to strain to have a bowel movement. Uses Gas-X prn.  Genitourinary: Negative.  Negative for dysuria, frequency, hematuria and urgency.  Musculoskeletal: Negative.  Negative for back pain, joint pain, myalgias and neck pain.  Skin: Negative.  Negative for itching and rash.  Neurological: Positive for weakness (leg weakness, improving). Negative for dizziness, tingling, sensory change and headaches.  Endo/Heme/Allergies: Bruises/bleeds easily (bruises on his elbows on Eliquis).  Psychiatric/Behavioral: Negative.  Negative for depression and memory loss. The patient is  not nervous/anxious and does not have insomnia.   All other systems reviewed and are negative.  Performance status (ECOG): 1***  Physical Exam Vitals and nursing note reviewed.  Constitutional:      General: He is not in acute distress.    Appearance: He is well-developed. He is not diaphoretic.  Eyes:     General: No scleral icterus.    Conjunctiva/sclera: Conjunctivae normal.     Comments: Glasses.  Blue eyes.  Neurological:     Mental Status: He is alert and oriented to person, place, and time.  Psychiatric:        Behavior: Behavior normal.        Thought Content: Thought content normal.        Judgment: Judgment normal.     No visits with results within 3 Day(s) from this visit.  Latest known visit with results is:  Hospital Outpatient Visit on 04/23/2020  Component Date Value Ref Range Status  . WBC  04/23/2020 23.1* 4.0 - 10.5 K/uL Final  . RBC 04/23/2020 4.55  4.22 - 5.81 MIL/uL Final  . Hemoglobin 04/23/2020 14.2  13.0 - 17.0 g/dL Final  . HCT 04/23/2020 42.8  39.0 - 52.0 % Final  . MCV 04/23/2020 94.1  80.0 - 100.0 fL Final  . MCH 04/23/2020 31.2  26.0 - 34.0 pg Final  . MCHC 04/23/2020 33.2  30.0 - 36.0 g/dL Final  . RDW 04/23/2020 14.8  11.5 - 15.5 % Final  . Platelets 04/23/2020 400  150 - 400 K/uL Final  . nRBC 04/23/2020 0.0  0.0 - 0.2 % Final  . Neutrophils Relative % 04/23/2020 73  % Final  . Neutro Abs 04/23/2020 16.9* 1.7 - 7.7 K/uL Final  . Lymphocytes Relative 04/23/2020 12  % Final  . Lymphs Abs 04/23/2020 2.7  0.7 - 4.0 K/uL Final  . Monocytes Relative 04/23/2020 8  % Final  . Monocytes Absolute 04/23/2020 1.9* 0.1 - 1.0 K/uL Final  . Eosinophils Relative 04/23/2020 0  % Final  . Eosinophils Absolute 04/23/2020 0.1  0.0 - 0.5 K/uL Final  . Basophils Relative 04/23/2020 0  % Final  . Basophils Absolute 04/23/2020 0.1  0.0 - 0.1 K/uL Final  . WBC Morphology 04/23/2020 MILD LEFT SHIFT (1-5% METAS, OCC MYELO, OCC BANDS)   Final  . RBC Morphology 04/23/2020 MORPHOLOGY UNREMARKABLE   Final  . Smear Review 04/23/2020 Normal platelet morphology   Final  . Immature Granulocytes 04/23/2020 7  % Final  . Abs Immature Granulocytes 04/23/2020 1.51* 0.00 - 0.07 K/uL Final   Performed at [facility], [address] [place], [place] [address]  . SURGICAL PATHOLOGY 04/23/2020    Final-Edited                   Value:SURGICAL PATHOLOGY CASE: [id number] PATIENT: Trason Khun Bone Marrow Report  Clinical History: leukocytosis and monocytosis, right ilium, (ADC)  DIAGNOSIS:  BONE MARROW, ASPIRATE, CLOT, CORE: - Normocellular marrow with mild myeloid hyperplasia and megakaryocytic atypia. - See comment.  PERIPHERAL BLOOD: - Leukocytosis with neutrophilia and monocytosis.  COMMENT:  The marrow is normocellular for age, but exhibits a mild myeloid hyperplasia  and mild megakaryocytic atypia. There is no increase in blasts. Flow cytometry reveals a subset of monocytes with CD56 expression, which can be seen in reactive or neoplastic conditions. While the marrow changes could be seen in a reactive setting, correlation with a next generation sequencing panel is recommended to exclude a myeloid neoplasm (myeloproliferative or  myeloproliferative/myelodysplastic disorder).  MICROSCOPIC DESCRIPTION:  PERIPHERAL BLOOD SMEAR: There is mild polychromasia.  Leukocytes are                          increased in numbers with neutrophilia and monocytosis.  There is a mild granulocytic left shift.  Platelets are present in normal numbers.  BONE MARROW ASPIRATE: Spicular and cellular. Erythroid precursors: Present in appropriate proportions.  No significant dysplasia. Granulocytic precursors: Present in appropriate proportions.  No significant dysplasia.  No increase in blasts. Megakaryocytes: Present with a few hypolobated and hyperlobated forms. Lymphocytes/plasma cells: Lymphocytes and plasma cells are not significantly increased.  TOUCH PREPARATIONS: Similar aspirate smears.  CLOT AND BIOPSY: The core biopsy and clot section are normocellular for age (31 to 40%).  There is a mild myeloid hyperplasia.  There is no increase in blasts by CD34 immunohistochemistry.  There are few atypical megakaryocytes (hyperlobated, hypolobated) with a few small clusters. There are no atypical lymphoid aggregates or infiltrates.  CD138 reveals a mild increase (5%) in plasma cel                         ls which are scattered.  By light chain in situ hybridization the plasma cells are polytypic.  IRON STAIN: Iron stains are performed on a bone marrow aspirate or touch imprint smear and section of clot. The controls stained appropriately.       Storage Iron: Present      Ring Sideroblasts: Absent  ADDITIONAL DATA/TESTING: Cytogenetics was ordered and will be  reported separately.  A next generation sequencing panel for MDS/CMML was ordered and will be reported separately. Flow cytometry ([id number]) is negative for increased blasts. There is a subset of monocytes with CD56 expression which can be seen in reactive and neoplastic settings.   CELL COUNT DATA:  Bone Marrow count performed on 500 cells shows: Blasts:   1%   Myeloid:  63% Promyelocytes: 7%   Erythroid:     28% Myelocytes:    7%   Lymphocytes:   4% Metamyelocytes:     3%   Plasma cells:  3% Bands:    7% Neutrophils:   37%  M:E ratio:     2.25 Eosinophils:   1% Basophils:     0% Monocytes:     2%                           Lab Data: CBC performed on 04/23/20 shows: WBC: 23.1 k/uL Neutrophils:   72%  Myelocytes            1% Hgb: 14.2 g/dL Lymphocytes:   24%  Metamyelocytes    2% HCT: 42.8 %    Monocytes:     1% MCV: 94.1 fL   Eosinophils:   0% RDW: 14.8 %    Basophils:     0% PLT: 400 k/uL  GROSS DESCRIPTION:  Specimen A: Aspirate smear.  Specimen B: Received in B-plus is a 1.2 x 1 x 0.1 cm aggregate of dark red soft material, submitted 1 block.  Specimen C: Received in B-plus is a 2.2 x 0.2 cm core of tan-red firm tissue, submitted in 1 block following decalcification with Immunocal.  SW 04/23/2020  Final Diagnosis performed by Vicente Males, MD.   Electronically signed 04/25/2020 Technical and / or Professional components performed at [facility], [address] [place],  [place] [address].  Immunohistochemistry Technical component (if applicable) was performed at [facility]. [address] [place], [place], [place] [address].   IM                         MUNOHISTOCHEMISTRY DISCLAIMER (if applicable): Some of these immunohistochemical stains may have been developed and the performance characteristics determine by [facility]. Some may not have been cleared or approved by the U.S. Food and Drug Administration.  The FDA has determined that such clearance or approval is not necessary. This test is used for clinical purposes. It should not be regarded as investigational or for research. This laboratory is certified under the [place] (CLIA-88) as qualified to perform high complexity clinical laboratory testing.  The controls stained appropriately.   . SURGICAL PATHOLOGY 04/23/2020    Final-Edited                   Value:Surgical Pathology CASE: [id number] PATIENT: Brent Diaz Flow Pathology Report  Clinical history: leukocytosis and monocytosis  DIAGNOSIS:  - No increase in blasts. - Monocyte subset with CD56 expression, see comment.  COMMENT:  CD56 is seen in a subset of monocytes. This can be seen in reactive and neoplastic conditions.  GATING AND PHENOTYPIC ANALYSIS:  Gated population: Flow cytometric immunophenotyping is performed using antibodies to the antigens listed in the table below. Electronic gates are placed around a cell cluster displaying light scatter properties corresponding to: blasts and lymphocytes  Abnormal Cells in gated population: N/A  Phenotype of Abnormal Cells: N/A                     Lymphoid Antigens       Myeloid Antigens Miscellaneous CD2  tested    CD10 tested    CD11b     tested    CD45 tested CD3  tested    CD19 tested    CD11c     ND   HLA-Dr    tested CD4  tested    CD20 tested    CD13 tested    CD34 tested CD                         5  tested    CD22 ND   CD14 tested    CD38 tested CD7  tested    CD79b     ND   CD15 tested    CD138     ND CD8  tested    CD103     ND   CD16 tested    TdT  ND CD25 ND   CD200     tested    CD33 tested    CD123     tested TCRab     ND   sKappa    tested    CD64 tested    CD41 ND TCRgd     tested    sLambda   tested    CD117     tested    CD61 ND CD56 tested    cKappa    ND   MPO  ND   CD71 ND CD57 ND   cLambda   ND             CD235a    ND  GROSS  DESCRIPTION:  Reference Bone Marrow case [id number].  Final Diagnosis performed by Vicente Males, MD.   Electronically  signed 04/25/2020 Technical and / or Professional components performed at [facility], [place] [address], [place], [place] [address].  The above tests were developed and their performance characteristics determined by the [facility] system for the physical and immunophenotypic characterization of cell populations. They have not been cleared by the U.S. Food and Drug administrati                         on. The  FDA has determined that such clearance or approval is not necessary. This test is used for clinical purposes. It should not be  regarded as investigational or for research    Assessment:  Brent Diaz is a 64 y.o. 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  WBC has ranged between 13,400 - 25,700 without trend.  Monocyte count has ranged between 1400 - 3200 since 06/30/2016.  Monocytes have been 7-16% of WBCs.  He has Crohn's disease and a history of sinusitis.  He smokes.  Work-up on 11/09/2017 revealed a WBC 20,100, ANC 17,100, hemoglobin 15.7, hematocrit 47.1, and platelets 251,000.  Normal studies included:  BCR-ABL and JAK2 V617F.  Flow cytometry revealed neutrophilia with slightly left-shifted maturation and monocytosis with rare phenotypic aberrancy.  Peripheral smear was unremarkable. CRP was 4.77 (elevated).   Bone marrow biopsy on 04/05/2018 revealed a normocellular bone marrow for age with trilineage hematopoiesis. Peripheral blood showed leukocytosis with left shifted neutrophilia and monocytosis. Cytogenetics was normal (46, XY).   JAK2 V617F, exon 12-15, CALR, and MPL were negative on 10/14/2018.  Flow cytometry on 03/27/2020 revealed neutrophilia with left-shifted maturation. There was absolute monocytosis with phenotypic aberrancy. No other significant diagnostic immunophenotypic abnormality was detected. Monocytes  showed aberrant expression of CD56, a finding that can be seen in association with both reactive/activated processes as well as neoplastic processes.  Bone marrow on 04/23/2020 revealed normocellular marrow with mild myeloid hyperplasia and megakaryocytic atypia. The marrow is normocellular for age, but exhibited a mild myeloid hyperplasia and mild megakaryocytic atypia. There was no increase in blasts. Flow cytometry revealed a subset of monocytes with CD56 expression, which can be seen in reactive or neoplastic conditions. Cytogenetics were normal (46, XY).  NGS is pending.  He has a history of pulmonary embolism in 10/2017.  Hypercoagulable work-up on 03/27/2020 included the following normal labs:  Factor V Leiden, prothrombin gene mutation, lupus anticoagulant panel, anti-cardiolipin antibodies, beta-2-glycoprotein antibodies, and antithrombin panel.  He is on Eliquis.  Symptomatically, ***  Plan: 1.   Review labs ***   2.   Leukocytosis             WBC 24,400 with an ANC of 16,500.  Monocyte count 2100.           Etiology has been felt reactive secondary to infections, Crohn's disease, and smoking.  Patient remains asymptomatic.  Continue to monitor. 3.   Monocytosis             Patient has had a monocytosis ranging between 1400 - 3200 (7-16% of WBCs).             Review prior concern for CMML concern, but not consistently > 10% of WBC.   Discuss no plan for treatment even if CMML diagnosed as he is asymptomatic.             Etiology possibly secondary to inflammatory bowl disease.             Differential also includes infections,  CML/AML (r/o by marrow and negative BCR-ABL), Hodgkin's, and steroids.   Discuss repeating flow cytometry at next visit given prior phenotypic abnormality.         Bone marrow aspirate and biopsy 05/21/2020. RTC 10 days after bone marrow for MD assess and discussion regarding direction of therapy.  I discussed the assessment and treatment plan with the  patient.  The patient was provided an opportunity to ask questions and all were answered.  The patient agreed with the plan and demonstrated an understanding of the instructions.  The patient was advised to call back if the symptoms worsen or if the condition fails to improve as anticipated.  I provided *** minutes of face-to-face time during this this encounter and > 50% was spent counseling as documented under my assessment and plan.   Lequita Asal, MD, PhD    05/02/2020, 4:29 PM  I, Mirian Mo Tufford, am acting as Education administrator for [employer]. Mike Gip, MD, PhD.  I, Melissa C. Mike Gip, MD, have reviewed the above documentation for accuracy and completeness, and I agree with the above.

## 2020-05-03 ENCOUNTER — Other Ambulatory Visit: Payer: Self-pay

## 2020-05-03 ENCOUNTER — Emergency Department: Payer: Medicare PPO | Admitting: Certified Registered Nurse Anesthetist

## 2020-05-03 ENCOUNTER — Inpatient Hospital Stay
Admission: EM | Admit: 2020-05-03 | Discharge: 2020-06-05 | DRG: 329 | Disposition: E | Payer: Medicare PPO | Attending: Surgery | Admitting: Surgery

## 2020-05-03 ENCOUNTER — Inpatient Hospital Stay: Payer: Medicare PPO | Admitting: Hematology and Oncology

## 2020-05-03 ENCOUNTER — Encounter: Payer: Self-pay | Admitting: Emergency Medicine

## 2020-05-03 ENCOUNTER — Telehealth: Payer: Self-pay | Admitting: *Deleted

## 2020-05-03 ENCOUNTER — Emergency Department: Payer: Medicare PPO

## 2020-05-03 ENCOUNTER — Encounter: Admission: EM | Disposition: E | Payer: Self-pay | Source: Home / Self Care | Attending: Surgery

## 2020-05-03 DIAGNOSIS — Z4659 Encounter for fitting and adjustment of other gastrointestinal appliance and device: Secondary | ICD-10-CM

## 2020-05-03 DIAGNOSIS — I472 Ventricular tachycardia: Secondary | ICD-10-CM | POA: Diagnosis not present

## 2020-05-03 DIAGNOSIS — K56609 Unspecified intestinal obstruction, unspecified as to partial versus complete obstruction: Secondary | ICD-10-CM

## 2020-05-03 DIAGNOSIS — K5090 Crohn's disease, unspecified, without complications: Secondary | ICD-10-CM | POA: Diagnosis present

## 2020-05-03 DIAGNOSIS — J9602 Acute respiratory failure with hypercapnia: Secondary | ICD-10-CM | POA: Diagnosis not present

## 2020-05-03 DIAGNOSIS — N281 Cyst of kidney, acquired: Secondary | ICD-10-CM | POA: Diagnosis present

## 2020-05-03 DIAGNOSIS — E440 Moderate protein-calorie malnutrition: Secondary | ICD-10-CM | POA: Diagnosis present

## 2020-05-03 DIAGNOSIS — D72819 Decreased white blood cell count, unspecified: Secondary | ICD-10-CM | POA: Diagnosis not present

## 2020-05-03 DIAGNOSIS — Z888 Allergy status to other drugs, medicaments and biological substances status: Secondary | ICD-10-CM

## 2020-05-03 DIAGNOSIS — I252 Old myocardial infarction: Secondary | ICD-10-CM

## 2020-05-03 DIAGNOSIS — G928 Other toxic encephalopathy: Secondary | ICD-10-CM | POA: Diagnosis not present

## 2020-05-03 DIAGNOSIS — Z7189 Other specified counseling: Secondary | ICD-10-CM

## 2020-05-03 DIAGNOSIS — I4819 Other persistent atrial fibrillation: Secondary | ICD-10-CM | POA: Diagnosis present

## 2020-05-03 DIAGNOSIS — K659 Peritonitis, unspecified: Secondary | ICD-10-CM | POA: Diagnosis present

## 2020-05-03 DIAGNOSIS — K760 Fatty (change of) liver, not elsewhere classified: Secondary | ICD-10-CM | POA: Diagnosis present

## 2020-05-03 DIAGNOSIS — D72821 Monocytosis (symptomatic): Secondary | ICD-10-CM

## 2020-05-03 DIAGNOSIS — Z20822 Contact with and (suspected) exposure to covid-19: Secondary | ICD-10-CM | POA: Diagnosis present

## 2020-05-03 DIAGNOSIS — Z66 Do not resuscitate: Secondary | ICD-10-CM | POA: Diagnosis not present

## 2020-05-03 DIAGNOSIS — K631 Perforation of intestine (nontraumatic): Secondary | ICD-10-CM | POA: Diagnosis present

## 2020-05-03 DIAGNOSIS — I10 Essential (primary) hypertension: Secondary | ICD-10-CM | POA: Diagnosis present

## 2020-05-03 DIAGNOSIS — K660 Peritoneal adhesions (postprocedural) (postinfection): Secondary | ICD-10-CM | POA: Diagnosis present

## 2020-05-03 DIAGNOSIS — A419 Sepsis, unspecified organism: Secondary | ICD-10-CM | POA: Diagnosis not present

## 2020-05-03 DIAGNOSIS — K219 Gastro-esophageal reflux disease without esophagitis: Secondary | ICD-10-CM | POA: Diagnosis present

## 2020-05-03 DIAGNOSIS — Z515 Encounter for palliative care: Secondary | ICD-10-CM | POA: Diagnosis not present

## 2020-05-03 DIAGNOSIS — R778 Other specified abnormalities of plasma proteins: Secondary | ICD-10-CM | POA: Diagnosis present

## 2020-05-03 DIAGNOSIS — D72829 Elevated white blood cell count, unspecified: Secondary | ICD-10-CM | POA: Diagnosis not present

## 2020-05-03 DIAGNOSIS — R000 Tachycardia, unspecified: Secondary | ICD-10-CM | POA: Diagnosis not present

## 2020-05-03 DIAGNOSIS — I480 Paroxysmal atrial fibrillation: Secondary | ICD-10-CM | POA: Diagnosis present

## 2020-05-03 DIAGNOSIS — Z9049 Acquired absence of other specified parts of digestive tract: Secondary | ICD-10-CM

## 2020-05-03 DIAGNOSIS — N179 Acute kidney failure, unspecified: Secondary | ICD-10-CM | POA: Diagnosis present

## 2020-05-03 DIAGNOSIS — I361 Nonrheumatic tricuspid (valve) insufficiency: Secondary | ICD-10-CM | POA: Diagnosis not present

## 2020-05-03 DIAGNOSIS — Z85820 Personal history of malignant melanoma of skin: Secondary | ICD-10-CM

## 2020-05-03 DIAGNOSIS — Z886 Allergy status to analgesic agent status: Secondary | ICD-10-CM

## 2020-05-03 DIAGNOSIS — K5720 Diverticulitis of large intestine with perforation and abscess without bleeding: Secondary | ICD-10-CM | POA: Diagnosis not present

## 2020-05-03 DIAGNOSIS — Z01818 Encounter for other preprocedural examination: Secondary | ICD-10-CM

## 2020-05-03 DIAGNOSIS — I4891 Unspecified atrial fibrillation: Secondary | ICD-10-CM | POA: Diagnosis not present

## 2020-05-03 DIAGNOSIS — Z7901 Long term (current) use of anticoagulants: Secondary | ICD-10-CM

## 2020-05-03 DIAGNOSIS — J9601 Acute respiratory failure with hypoxia: Secondary | ICD-10-CM | POA: Diagnosis not present

## 2020-05-03 DIAGNOSIS — Z7951 Long term (current) use of inhaled steroids: Secondary | ICD-10-CM

## 2020-05-03 DIAGNOSIS — E875 Hyperkalemia: Secondary | ICD-10-CM | POA: Diagnosis not present

## 2020-05-03 DIAGNOSIS — R0602 Shortness of breath: Secondary | ICD-10-CM | POA: Diagnosis not present

## 2020-05-03 DIAGNOSIS — Z6826 Body mass index (BMI) 26.0-26.9, adult: Secondary | ICD-10-CM

## 2020-05-03 DIAGNOSIS — R0682 Tachypnea, not elsewhere classified: Secondary | ICD-10-CM

## 2020-05-03 DIAGNOSIS — Y838 Other surgical procedures as the cause of abnormal reaction of the patient, or of later complication, without mention of misadventure at the time of the procedure: Secondary | ICD-10-CM | POA: Diagnosis not present

## 2020-05-03 DIAGNOSIS — Z8614 Personal history of Methicillin resistant Staphylococcus aureus infection: Secondary | ICD-10-CM

## 2020-05-03 DIAGNOSIS — K920 Hematemesis: Secondary | ICD-10-CM | POA: Diagnosis present

## 2020-05-03 DIAGNOSIS — R6521 Severe sepsis with septic shock: Secondary | ICD-10-CM | POA: Diagnosis not present

## 2020-05-03 DIAGNOSIS — Z79899 Other long term (current) drug therapy: Secondary | ICD-10-CM

## 2020-05-03 DIAGNOSIS — K9181 Other intraoperative complications of digestive system: Secondary | ICD-10-CM | POA: Diagnosis not present

## 2020-05-03 DIAGNOSIS — C171 Malignant neoplasm of jejunum: Secondary | ICD-10-CM | POA: Diagnosis present

## 2020-05-03 DIAGNOSIS — R5383 Other fatigue: Secondary | ICD-10-CM

## 2020-05-03 DIAGNOSIS — J690 Pneumonitis due to inhalation of food and vomit: Secondary | ICD-10-CM | POA: Diagnosis not present

## 2020-05-03 DIAGNOSIS — K651 Peritoneal abscess: Secondary | ICD-10-CM | POA: Diagnosis not present

## 2020-05-03 DIAGNOSIS — B999 Unspecified infectious disease: Secondary | ICD-10-CM | POA: Diagnosis not present

## 2020-05-03 DIAGNOSIS — Z22322 Carrier or suspected carrier of Methicillin resistant Staphylococcus aureus: Secondary | ICD-10-CM

## 2020-05-03 DIAGNOSIS — F17210 Nicotine dependence, cigarettes, uncomplicated: Secondary | ICD-10-CM | POA: Diagnosis present

## 2020-05-03 DIAGNOSIS — J9600 Acute respiratory failure, unspecified whether with hypoxia or hypercapnia: Secondary | ICD-10-CM

## 2020-05-03 DIAGNOSIS — Z86711 Personal history of pulmonary embolism: Secondary | ICD-10-CM

## 2020-05-03 HISTORY — PX: LAPAROTOMY: SHX154

## 2020-05-03 LAB — TYPE AND SCREEN
ABO/RH(D): O POS
Antibody Screen: NEGATIVE

## 2020-05-03 LAB — COMPREHENSIVE METABOLIC PANEL
ALT: 15 U/L (ref 0–44)
AST: 16 U/L (ref 15–41)
Albumin: 2.7 g/dL — ABNORMAL LOW (ref 3.5–5.0)
Alkaline Phosphatase: 56 U/L (ref 38–126)
Anion gap: 10 (ref 5–15)
BUN: 19 mg/dL (ref 8–23)
CO2: 23 mmol/L (ref 22–32)
Calcium: 8.6 mg/dL — ABNORMAL LOW (ref 8.9–10.3)
Chloride: 106 mmol/L (ref 98–111)
Creatinine, Ser: 0.53 mg/dL — ABNORMAL LOW (ref 0.61–1.24)
GFR, Estimated: >60 mL/min (ref >60)
Glucose, Bld: 149 mg/dL — ABNORMAL HIGH (ref 70–99)
Potassium: 3.8 mmol/L (ref 3.5–5.1)
Sodium: 139 mmol/L (ref 135–145)
Total Bilirubin: 0.5 mg/dL (ref 0.3–1.2)
Total Protein: 5.8 g/dL — ABNORMAL LOW (ref 6.5–8.1)

## 2020-05-03 LAB — BLOOD GAS, ARTERIAL
Acid-base deficit: 10.7 mmol/L — ABNORMAL HIGH (ref 0.0–2.0)
Acid-base deficit: 7.1 mmol/L — ABNORMAL HIGH (ref 0.0–2.0)
Bicarbonate: 14.3 mmol/L — ABNORMAL LOW (ref 20.0–28.0)
Bicarbonate: 16.8 mmol/L — ABNORMAL LOW (ref 20.0–28.0)
FIO2: 0.99
FIO2: 0.92
O2 Saturation: 99.4 %
O2 Saturation: 100 %
Patient temperature: 37
Patient temperature: 37
pCO2 arterial: 29 mmHg — ABNORMAL LOW (ref 32.0–48.0)
pCO2 arterial: 29 mmHg — ABNORMAL LOW (ref 32.0–48.0)
pH, Arterial: 7.3 — ABNORMAL LOW (ref 7.350–7.450)
pH, Arterial: 7.37 (ref 7.350–7.450)
pO2, Arterial: 167 mmHg — ABNORMAL HIGH (ref 83.0–108.0)
pO2, Arterial: 370 mmHg — ABNORMAL HIGH (ref 83.0–108.0)

## 2020-05-03 LAB — CBC
HCT: 39.8 % (ref 39.0–52.0)
HCT: 36.8 % — ABNORMAL LOW (ref 39.0–52.0)
Hemoglobin: 12.6 g/dL — ABNORMAL LOW (ref 13.0–17.0)
Hemoglobin: 11.7 g/dL — ABNORMAL LOW (ref 13.0–17.0)
MCH: 30.7 pg (ref 26.0–34.0)
MCH: 31 pg (ref 26.0–34.0)
MCHC: 31.7 g/dL (ref 30.0–36.0)
MCHC: 31.8 g/dL (ref 30.0–36.0)
MCV: 97.1 fL (ref 80.0–100.0)
MCV: 97.6 fL (ref 80.0–100.0)
Platelets: 405 10*3/uL — ABNORMAL HIGH (ref 150–400)
Platelets: 361 10*3/uL (ref 150–400)
RBC: 4.1 MIL/uL — ABNORMAL LOW (ref 4.22–5.81)
RBC: 3.77 MIL/uL — ABNORMAL LOW (ref 4.22–5.81)
RDW: 15.5 % (ref 11.5–15.5)
RDW: 15.6 % — ABNORMAL HIGH (ref 11.5–15.5)
WBC: 32.8 10*3/uL — ABNORMAL HIGH (ref 4.0–10.5)
WBC: 36.7 10*3/uL — ABNORMAL HIGH (ref 4.0–10.5)
nRBC: 0 % (ref 0.0–0.2)
nRBC: 0.1 % (ref 0.0–0.2)

## 2020-05-03 LAB — LACTIC ACID, PLASMA: Lactic Acid, Venous: 3.8 mmol/L (ref 0.5–1.9)

## 2020-05-03 LAB — RESP PANEL BY RT-PCR (FLU A&B, COVID) ARPGX2
Influenza A by PCR: NEGATIVE
Influenza B by PCR: NEGATIVE
SARS Coronavirus 2 by RT PCR: NEGATIVE

## 2020-05-03 LAB — ABO/RH: ABO/RH(D): O POS

## 2020-05-03 LAB — GLUCOSE, CAPILLARY
Glucose-Capillary: 141 mg/dL — ABNORMAL HIGH (ref 70–99)
Glucose-Capillary: 140 mg/dL — ABNORMAL HIGH (ref 70–99)

## 2020-05-03 LAB — CBG MONITORING, ED: Glucose-Capillary: 124 mg/dL — ABNORMAL HIGH (ref 70–99)

## 2020-05-03 LAB — LIPASE, BLOOD: Lipase: 20 U/L (ref 11–51)

## 2020-05-03 SURGERY — LAPAROTOMY, EXPLORATORY
Anesthesia: General

## 2020-05-03 MED ORDER — SODIUM CHLORIDE 0.9 % IV SOLN
80.0000 mg | Freq: Once | INTRAVENOUS | Status: AC
  Administered 2020-05-03: 21:00:00 80 mg via INTRAVENOUS
  Filled 2020-05-03: qty 80

## 2020-05-03 MED ORDER — DEXAMETHASONE SODIUM PHOSPHATE 10 MG/ML IJ SOLN
INTRAMUSCULAR | Status: AC
  Filled 2020-05-03: qty 1

## 2020-05-03 MED ORDER — PIPERACILLIN-TAZOBACTAM 3.375 G IVPB 30 MIN
3.3750 g | Freq: Once | INTRAVENOUS | Status: AC
  Administered 2020-05-03: 07:00:00 3.375 g via INTRAVENOUS
  Filled 2020-05-03: qty 50

## 2020-05-03 MED ORDER — ALBUMIN HUMAN 5 % IV SOLN
INTRAVENOUS | Status: AC
  Filled 2020-05-03: qty 250

## 2020-05-03 MED ORDER — ACETAMINOPHEN 650 MG RE SUPP
650.0000 mg | Freq: Four times a day (QID) | RECTAL | Status: DC | PRN
  Administered 2020-05-05 – 2020-05-06 (×4): 650 mg via RECTAL
  Filled 2020-05-03 (×4): qty 1

## 2020-05-03 MED ORDER — PROPOFOL 10 MG/ML IV BOLUS
INTRAVENOUS | Status: DC | PRN
  Administered 2020-05-03: 100 mg via INTRAVENOUS

## 2020-05-03 MED ORDER — FENTANYL CITRATE (PF) 100 MCG/2ML IJ SOLN
25.0000 ug | INTRAMUSCULAR | Status: DC | PRN
  Administered 2020-05-03: 50 ug via INTRAVENOUS

## 2020-05-03 MED ORDER — PANTOPRAZOLE SODIUM 40 MG IV SOLR: 40.0000 mg | Freq: Two times a day (BID) | INTRAVENOUS | Status: DC

## 2020-05-03 MED ORDER — SODIUM CHLORIDE 0.9 % IV SOLN: INTRAVENOUS | Status: DC

## 2020-05-03 MED ORDER — NOREPINEPHRINE BITARTRATE 1 MG/ML IV SOLN
INTRAVENOUS | Status: AC
  Filled 2020-05-03: qty 4

## 2020-05-03 MED ORDER — MORPHINE SULFATE (PF) 4 MG/ML IV SOLN
4.0000 mg | Freq: Once | INTRAVENOUS | Status: AC
  Administered 2020-05-03: 11:00:00 4 mg via INTRAVENOUS
  Filled 2020-05-03: qty 1

## 2020-05-03 MED ORDER — IOHEXOL 300 MG/ML  SOLN
100.0000 mL | Freq: Once | INTRAMUSCULAR | Status: AC | PRN
  Administered 2020-05-03: 08:00:00 100 mL via INTRAVENOUS

## 2020-05-03 MED ORDER — SODIUM CHLORIDE 0.9 % IV BOLUS
1000.0000 mL | Freq: Once | INTRAVENOUS | Status: AC
  Administered 2020-05-03: 05:00:00 1000 mL via INTRAVENOUS

## 2020-05-03 MED ORDER — SODIUM CHLORIDE 0.9 % IV SOLN: INTRAVENOUS | Status: DC | PRN

## 2020-05-03 MED ORDER — SUCCINYLCHOLINE CHLORIDE 20 MG/ML IJ SOLN
INTRAMUSCULAR | Status: DC | PRN
  Administered 2020-05-03: 100 mg via INTRAVENOUS

## 2020-05-03 MED ORDER — LACTATED RINGERS IV SOLN: INTRAVENOUS | Status: DC | PRN

## 2020-05-03 MED ORDER — DEXAMETHASONE SODIUM PHOSPHATE 10 MG/ML IJ SOLN
INTRAMUSCULAR | Status: DC | PRN
  Administered 2020-05-03: 5 mg via INTRAVENOUS

## 2020-05-03 MED ORDER — MORPHINE SULFATE (PF) 2 MG/ML IV SOLN
1.0000 mg | INTRAVENOUS | Status: DC | PRN
  Administered 2020-05-03 – 2020-05-04 (×4): 1 mg via INTRAVENOUS
  Filled 2020-05-03 (×4): qty 1

## 2020-05-03 MED ORDER — PHENYLEPHRINE HCL (PRESSORS) 10 MG/ML IV SOLN
INTRAVENOUS | Status: DC | PRN
  Administered 2020-05-03 (×3): 200 ug via INTRAVENOUS
  Administered 2020-05-03: 100 ug via INTRAVENOUS

## 2020-05-03 MED ORDER — ROCURONIUM BROMIDE 100 MG/10ML IV SOLN
INTRAVENOUS | Status: DC | PRN
  Administered 2020-05-03: 20 mg via INTRAVENOUS
  Administered 2020-05-03: 50 mg via INTRAVENOUS

## 2020-05-03 MED ORDER — LIDOCAINE HCL (PF) 2 % IJ SOLN
INTRAMUSCULAR | Status: AC
  Filled 2020-05-03: qty 5

## 2020-05-03 MED ORDER — NOREPINEPHRINE 4 MG/250ML-% IV SOLN
INTRAVENOUS | Status: AC
  Filled 2020-05-03: qty 250

## 2020-05-03 MED ORDER — MORPHINE SULFATE (PF) 4 MG/ML IV SOLN
4.0000 mg | Freq: Once | INTRAVENOUS | Status: AC
  Administered 2020-05-03: 05:00:00 4 mg via INTRAVENOUS
  Filled 2020-05-03: qty 1

## 2020-05-03 MED ORDER — ONDANSETRON 4 MG PO TBDP
4.0000 mg | ORAL_TABLET | Freq: Four times a day (QID) | ORAL | Status: DC | PRN
  Filled 2020-05-03: qty 1

## 2020-05-03 MED ORDER — ONDANSETRON HCL 4 MG/2ML IJ SOLN: 4.0000 mg | Freq: Once | INTRAMUSCULAR | Status: DC | PRN

## 2020-05-03 MED ORDER — LIDOCAINE HCL (CARDIAC) PF 100 MG/5ML IV SOSY
PREFILLED_SYRINGE | INTRAVENOUS | Status: DC | PRN
  Administered 2020-05-03: 100 mg via INTRAVENOUS

## 2020-05-03 MED ORDER — ONDANSETRON HCL 4 MG/2ML IJ SOLN
INTRAMUSCULAR | Status: AC
  Filled 2020-05-03: qty 2

## 2020-05-03 MED ORDER — ONDANSETRON HCL 4 MG/2ML IJ SOLN
4.0000 mg | Freq: Four times a day (QID) | INTRAMUSCULAR | Status: DC | PRN
  Administered 2020-05-04: 4 mg via INTRAVENOUS
  Filled 2020-05-03 (×2): qty 2

## 2020-05-03 MED ORDER — ROCURONIUM BROMIDE 10 MG/ML (PF) SYRINGE
PREFILLED_SYRINGE | INTRAVENOUS | Status: AC
  Filled 2020-05-03: qty 10

## 2020-05-03 MED ORDER — ACETAMINOPHEN 10 MG/ML IV SOLN
INTRAVENOUS | Status: DC | PRN
  Administered 2020-05-03: 1000 mg via INTRAVENOUS

## 2020-05-03 MED ORDER — SODIUM CHLORIDE 0.9 % IV SOLN
INTRAVENOUS | Status: DC | PRN
  Administered 2020-05-03: 70 mL

## 2020-05-03 MED ORDER — NOREPINEPHRINE 4 MG/250ML-% IV SOLN
INTRAVENOUS | Status: DC | PRN
  Administered 2020-05-03: 4 ug/min via INTRAVENOUS

## 2020-05-03 MED ORDER — PROPOFOL 10 MG/ML IV BOLUS
INTRAVENOUS | Status: AC
  Filled 2020-05-03: qty 20

## 2020-05-03 MED ORDER — FENTANYL CITRATE (PF) 100 MCG/2ML IJ SOLN
INTRAMUSCULAR | Status: AC
  Filled 2020-05-03: qty 2

## 2020-05-03 MED ORDER — BUPIVACAINE-EPINEPHRINE (PF) 0.5% -1:200000 IJ SOLN
INTRAMUSCULAR | Status: DC | PRN
  Administered 2020-05-03: 30 mL

## 2020-05-03 MED ORDER — FENTANYL CITRATE (PF) 100 MCG/2ML IJ SOLN
INTRAMUSCULAR | Status: DC | PRN
  Administered 2020-05-03 (×2): 25 ug via INTRAVENOUS
  Administered 2020-05-03: 50 ug via INTRAVENOUS

## 2020-05-03 MED ORDER — MORPHINE SULFATE (PF) 4 MG/ML IV SOLN
4.0000 mg | Freq: Once | INTRAVENOUS | Status: AC
  Administered 2020-05-03: 06:00:00 4 mg via INTRAVENOUS
  Filled 2020-05-03: qty 1

## 2020-05-03 MED ORDER — FENTANYL CITRATE (PF) 100 MCG/2ML IJ SOLN
INTRAMUSCULAR | Status: AC
  Administered 2020-05-03: 18:00:00 50 ug via INTRAVENOUS
  Filled 2020-05-03: qty 2

## 2020-05-03 MED ORDER — ACETAMINOPHEN 325 MG PO TABS: 650.0000 mg | ORAL_TABLET | Freq: Four times a day (QID) | ORAL | Status: DC | PRN

## 2020-05-03 MED ORDER — ALBUMIN HUMAN 5 % IV SOLN: INTRAVENOUS | Status: DC | PRN

## 2020-05-03 MED ORDER — SUGAMMADEX SODIUM 200 MG/2ML IV SOLN
INTRAVENOUS | Status: DC | PRN
  Administered 2020-05-03: 200 mg via INTRAVENOUS

## 2020-05-03 MED ORDER — EMPTY CONTAINERS FLEXIBLE MISC: 400.0000 mg | Freq: Once | Status: DC

## 2020-05-03 MED ORDER — ONDANSETRON HCL 4 MG/2ML IJ SOLN
4.0000 mg | Freq: Four times a day (QID) | INTRAMUSCULAR | Status: DC | PRN
  Administered 2020-05-03 (×2): 4 mg via INTRAVENOUS
  Filled 2020-05-03 (×2): qty 2

## 2020-05-03 MED ORDER — SUCCINYLCHOLINE CHLORIDE 200 MG/10ML IV SOSY
PREFILLED_SYRINGE | INTRAVENOUS | Status: AC
  Filled 2020-05-03: qty 10

## 2020-05-03 MED ORDER — PIPERACILLIN-TAZOBACTAM 3.375 G IVPB
3.3750 g | Freq: Three times a day (TID) | INTRAVENOUS | Status: DC
  Administered 2020-05-03 – 2020-05-09 (×16): 3.375 g via INTRAVENOUS
  Filled 2020-05-03 (×19): qty 50

## 2020-05-03 MED ORDER — NOREPINEPHRINE 4 MG/250ML-% IV SOLN
2.0000 ug/min | INTRAVENOUS | Status: DC
  Filled 2020-05-03: qty 250

## 2020-05-03 MED ORDER — ONDANSETRON HCL 4 MG/2ML IJ SOLN
INTRAMUSCULAR | Status: DC | PRN
  Administered 2020-05-03: 4 mg via INTRAVENOUS

## 2020-05-03 MED ORDER — MIDAZOLAM HCL 2 MG/2ML IJ SOLN
INTRAMUSCULAR | Status: AC
  Filled 2020-05-03: qty 2

## 2020-05-03 MED ORDER — SODIUM CHLORIDE 0.9 % IV BOLUS (SEPSIS)
1000.0000 mL | Freq: Once | INTRAVENOUS | Status: AC
  Administered 2020-05-03: 11:00:00 1000 mL via INTRAVENOUS

## 2020-05-03 MED ORDER — CHLORHEXIDINE GLUCONATE CLOTH 2 % EX PADS
6.0000 | MEDICATED_PAD | Freq: Every day | CUTANEOUS | Status: DC
  Administered 2020-05-03 – 2020-05-08 (×6): 6 via TOPICAL

## 2020-05-03 MED ORDER — SODIUM CHLORIDE 0.9 % IV SOLN
8.0000 mg/h | INTRAVENOUS | Status: DC
  Administered 2020-05-03 – 2020-05-04 (×2): 8 mg/h via INTRAVENOUS
  Filled 2020-05-03 (×4): qty 80

## 2020-05-03 MED ORDER — ACETAMINOPHEN 10 MG/ML IV SOLN
INTRAVENOUS | Status: AC
  Filled 2020-05-03: qty 100

## 2020-05-03 MED ORDER — DEXMEDETOMIDINE HCL 200 MCG/2ML IV SOLN
INTRAVENOUS | Status: DC | PRN
  Administered 2020-05-03: 8 ug via INTRAVENOUS

## 2020-05-03 MED ORDER — EMPTY CONTAINERS FLEXIBLE MISC
900.0000 mg | Freq: Once | Status: AC
  Administered 2020-05-03: 900 mg via INTRAVENOUS
  Filled 2020-05-03: qty 90

## 2020-05-03 SURGICAL SUPPLY — 38 items
BULB RESERV EVAC DRAIN JP 100C (MISCELLANEOUS) ×2 IMPLANT
CANISTER SUCT 1200ML W/VALVE (MISCELLANEOUS) IMPLANT
CHLORAPREP W/TINT 26 (MISCELLANEOUS) ×2 IMPLANT
COVER WAND RF STERILE (DRAPES) ×2 IMPLANT
DRAIN CHANNEL JP 15F RND 16 (MISCELLANEOUS) ×2 IMPLANT
DRAPE LAPAROTOMY 100X77 ABD (DRAPES) ×2 IMPLANT
DRSG OPSITE POSTOP 4X10 (GAUZE/BANDAGES/DRESSINGS) IMPLANT
DRSG OPSITE POSTOP 4X8 (GAUZE/BANDAGES/DRESSINGS) IMPLANT
DRSG TEGADERM 4X10 (GAUZE/BANDAGES/DRESSINGS) IMPLANT
DRSG TELFA 3X8 NADH (GAUZE/BANDAGES/DRESSINGS) IMPLANT
ELECT REM PT RETURN 9FT ADLT (ELECTROSURGICAL) ×2
ELECTRODE REM PT RTRN 9FT ADLT (ELECTROSURGICAL) ×1 IMPLANT
GLOVE BIO SURGEON STRL SZ 6.5 (GLOVE) ×6 IMPLANT
GLOVE BIOGEL PI IND STRL 6.5 (GLOVE) ×1 IMPLANT
GLOVE BIOGEL PI INDICATOR 6.5 (GLOVE) ×1
GOWN STRL REUS W/ TWL LRG LVL3 (GOWN DISPOSABLE) ×3 IMPLANT
GOWN STRL REUS W/TWL LRG LVL3 (GOWN DISPOSABLE) ×3
KIT TURNOVER KIT A (KITS) ×2 IMPLANT
LABEL OR SOLS (LABEL) ×2 IMPLANT
MANIFOLD NEPTUNE II (INSTRUMENTS) ×2 IMPLANT
NS IRRIG 1000ML POUR BTL (IV SOLUTION) ×2 IMPLANT
PACK BASIN MAJOR ARMC (MISCELLANEOUS) ×2 IMPLANT
PACK COLON CLEAN CLOSURE (MISCELLANEOUS) IMPLANT
RELOAD PROXIMATE 75MM BLUE (ENDOMECHANICALS) ×2 IMPLANT
SPONGE LAP 18X18 RF (DISPOSABLE) ×2 IMPLANT
STAPLER PROXIMATE 75MM BLUE (STAPLE) ×2 IMPLANT
STAPLER SKIN PROX 35W (STAPLE) ×2 IMPLANT
SUT PDS AB 1 CT1 27 (SUTURE) ×4 IMPLANT
SUT PDS AB 1 TP1 54 (SUTURE) IMPLANT
SUT SILK 2 0 (SUTURE)
SUT SILK 2-0 18XBRD TIE 12 (SUTURE) IMPLANT
SUT SILK 3 0 (SUTURE)
SUT SILK 3-0 18XBRD TIE 12 (SUTURE) IMPLANT
SUT V-LOC 90 ABS DVC 3-0 CL (SUTURE) ×2 IMPLANT
SUT VIC AB 3-0 SH 27 (SUTURE)
SUT VIC AB 3-0 SH 27X BRD (SUTURE) IMPLANT
SYR 30ML LL (SYRINGE) ×6 IMPLANT
TRAY FOLEY MTR SLVR 16FR STAT (SET/KITS/TRAYS/PACK) ×2 IMPLANT

## 2020-05-03 NOTE — Op Note (Addendum)
Preoperative diagnosis: Closed-loop bowel obstruction Postoperative diagnosis: Same with perforated bowel  Procedure: Exploratory laparotomy, small bowel resection, lysis of adhesion Anesthesia: GETA  Surgeon: Lysle Pearl Assistant: Cintron-Diaz for better exposure  Wound Classification: Clean Contaminated  Specimen: Proximal jejunum  Complications: None  EBL: 190m  Indications:  Patient is a 64y.o. male presenting with an acute abdomen in the ED with CT concerning for closed-loop obstruction.  Patient taken emergently to the OR after reversal of Eliquis.  Please see H&P for further detail  Description of procedure: The patient was taken to the operating room and placed in the supine position. General endotracheal anesthesia was induced without any difficulty. A time-out was completed verifying correct patient, procedure, site, positioning, and implant(s) and/or special equipment prior to beginning this procedure. Both upper extremities were tucked.  A Foley catheter and an nasogastric tube were placed. Preoperative antibiotics were given. T  Midline incision made over previous incision and abdominal cavity entered.  Extensive adhesions were noted throughout the abdomen from prior procedures.  Meticulous lysis of adhesions performed throughout the entire procedure to gain better exposure.  One small serosal tear noted in the mid jejunum during the initial lysis of adhesions.  This was closed with interrupted 3-0 silk. Further inspection of the left upper quadrant where the area of concern was noted on CT showed feculant peritonitis with copious amounts of bile which needed to be suctioned out.  The portion of small bowel where the bile leak was coming from was eventually noted.  Further inspection of the area noted fat creeping with overall thickened and strictured tissue at the distal aspect of the extremely dilated bowel with 2 large perforations.  Copious amounts of bile as well as old blood and  clots were noted to be coming out of the perforations.  Once the area was thoroughly suctioned and irrigated, a portion immediately distal and proximal to the dilated jejunum was chosen for resection.  Diverticulitis was noted on CT scan in sigmoid scan, so this area was examined as well and the feculant peritonitis noted around the jejunum in LUQ was extending along the left pericolic gutter to the LLQ area, with extensive thickened tissue note along the entire descending colon.  No specific area on the sigmoid was noted to have obvious abscess formation, and no additional perforations were noted in this area either.    Area of resection noted to have grossly normal looking bowel.  GIA stapler with a blue load 75 mm was then used to transect the bowel and LigaSure was used to transect the mesentery to remove the damaged small bowel off the operative field pending pathology.  After more lysis of adhesions was performed, small bowel was able to be run from the ileocecal junction to the loop portion of the previously created gastrojejunostomy.  More proximal to this anastomosis was where the bowel was resected.  The proximal staple line was immediately distal to the ligament of Treitz.  In order to facilitate a new anastomosis, the ligament of Treitz was transected and mobilization of the duodenum was done towards the second portion.  Care was noted to not damage the pancreas or biliary duct around this area.  Once adequate mobilization was obtained, the 2 staple ends were brought together in an isoperistaltic fashion, 2 enterotomies were made and a 75 mm GIA stapler was placed through these to create new side-to-side anastomosis.  The enterotomy was then closed with running 3-0 VLock in a 2 layer fashion.  Milking of  contents towards the anastomosis did not note any obvious leaks afterwards.  Abdominal cavity was then extensively irrigated, Blake drain placed over the new anastomosis, secured to skin using 3-0  nylon and clean closure protocol was initiated.  Exparel was infused to the incision prior to closing with 1 PDS x2.  Overlying skin then approximated with staples.  Incision dressed with honeycomb dressing.    The patient tolerated the procedure well with some pressor support and was extubated and taken to the postoperative care unit in guarded condition

## 2020-05-03 NOTE — ED Notes (Signed)
Pt in restroom, attempted to give urine sample, unable. Pt is diaphoretic, clammy, says he is feeling more and more weak. Assisted to w/c, taken to subwait to await treatment room.

## 2020-05-03 NOTE — Progress Notes (Signed)
PT has new skin tear to LFT AC area after taking paper tape off during pre-op. Tear dressed with Vaseline guaze and curlex

## 2020-05-03 NOTE — Consult Note (Signed)
Pharmacy Antibiotic Note  Brent Diaz is a 64 y.o. male admitted on 04/07/2020 with Peritonitis. Pt presented via EMS with severe abdominal pain associated with hematemesis and hematochezia. PMH include Crohn's, bowel resection, GERD, HTN, and PE in 2019. Pt underwent emergent surgery for closed-loop bowel obstruction following reversal of apixaban on 12/30. Pharmacy has been consulted for Zosyn dosing.  12/30 CT abd: Findings suspicious for a closed loop proximal jejunal obstruction due to an internal hernia.  WBC 32.8>36.7, afebrile since admission  Plan: Zosyn 3.375g IV q8h (4 hour infusion). Monitor renal function  Temp (24hrs), Avg:97.8 F (36.6 C), Min:97.2 F (36.2 C), Max:98.3 F (36.8 C)  Recent Labs  Lab 04/09/2020 0426 04/17/2020 0846 04/27/2020 1311  WBC 32.8*  --  36.7*  CREATININE 0.53*  --   --   LATICACIDVEN  --  3.8*  --     Estimated Creatinine Clearance: 96.3 mL/min (A) (by C-G formula based on SCr of 0.53 mg/dL (L)).    Allergies  Allergen Reactions  . Amlodipine Swelling    feet  . Aspirin Other (See Comments)    Peptic ulcers  . Flagyl [Metronidazole]     Gives neuropathy if taken too long     Antimicrobials this admission: 12/30 Zosyn >>  Microbiology results: 12/30 resp. Panel: negative  Thank you for allowing pharmacy to be a part of this patient's care.  Benn Moulder, PharmD Pharmacy Resident  05/02/2020 7:34 PM

## 2020-05-03 NOTE — Telephone Encounter (Signed)
Wife called reporting that patient will not be able to make his visit today as he is in the hospital. Per chart notes, he has a bowel obstruction

## 2020-05-03 NOTE — ED Triage Notes (Signed)
Pt reports around 0130 this morning he was awoke with lower abdominal pain, pain is "constant pressure". No vomiting. Black stools last night. Pt is on Eliquis. Hx of Chron's and bowel resection.

## 2020-05-03 NOTE — Anesthesia Procedure Notes (Signed)
Central Venous Catheter Insertion Performed by: Tera Mater, MD, anesthesiologist Start/End12/29/2021 1:20 PM, 04/19/2020 1:40 PM Patient location: Pre-op. Preanesthetic checklist: patient identified, IV checked, site marked, risks and benefits discussed, surgical consent, monitors and equipment checked, pre-op evaluation, timeout performed and anesthesia consent Patient sedated Hand hygiene performed , maximum sterile barriers used  and Seldinger technique used Catheter size: 8 Fr Total catheter length 16. Central line was placed.Double lumen Procedure performed using ultrasound guided technique. Ultrasound Notes:anatomy identified, needle tip was noted to be adjacent to the nerve/plexus identified and no ultrasound evidence of intravascular and/or intraneural injection Attempts: 1 Following insertion, dressing applied, line sutured and Biopatch. Post procedure assessment: blood return through all ports  Patient tolerated the procedure well with no immediate complications.

## 2020-05-03 NOTE — Transfer of Care (Signed)
Immediate Anesthesia Transfer of Care Note  Patient: Brent Diaz  Procedure(s) Performed: EXPLORATORY LAPAROTOMY (N/A )  Patient Location: PACU  Anesthesia Type:General  Level of Consciousness: awake and drowsy  Airway & Oxygen Therapy: Patient Spontanous Breathing and Patient connected to face mask oxygen  Post-op Assessment: Report given to RN and Post -op Vital signs reviewed and stable  Post vital signs: Reviewed and stable  Last Vitals:  Vitals Value Taken Time  BP 144/114 04/12/2020 1529  Temp    Pulse 84 05/04/2020 1529  Resp    SpO2 97 % 04/22/2020 1529    Last Pain:  Vitals:   04/16/2020 1205  TempSrc: Oral  PainSc:          Complications: No complications documented.

## 2020-05-03 NOTE — Plan of Care (Signed)
  Problem: Clinical Measurements: Goal: Will remain free from infection Outcome: Progressing Goal: Respiratory complications will improve Outcome: Progressing   Problem: Nutrition: Goal: Adequate nutrition will be maintained Outcome: Progressing   Problem: Pain Managment: Goal: General experience of comfort will improve Outcome: Progressing   Problem: Safety: Goal: Ability to remain free from injury will improve Outcome: Progressing

## 2020-05-03 NOTE — Anesthesia Preprocedure Evaluation (Addendum)
Anesthesia Evaluation  Patient identified by MRN, date of birth, ID band Patient awake    Reviewed: Allergy & Precautions, H&P , NPO status , Patient's Chart, lab work & pertinent test results  History of Anesthesia Complications Negative for: history of anesthetic complications  Airway Mallampati: III  TM Distance: >3 FB     Dental  (+) Chipped   Pulmonary neg sleep apnea, neg COPD, Current Smoker,  H/o PE    + decreased breath sounds      Cardiovascular hypertension, (-) angina(-) Past MI and (-) Cardiac Stents (-) dysrhythmias  Rhythm:regular Rate:Normal  H/o on PE anticoagulated on apixaban, being reversed   Neuro/Psych negative neurological ROS  negative psych ROS   GI/Hepatic Neg liver ROS, GERD  ,Crohn's disease Now with ischemic bowel    Endo/Other  negative endocrine ROS  Renal/GU      Musculoskeletal   Abdominal   Peds  Hematology negative hematology ROS (+)   Anesthesia Other Findings 64 yo M with Crohn's disease, PE presented to ED with N/V and abdominal pain, found to have acute bowel obstruction and elevated lactate.  NG placed and Eliquis being reversed in ED.  Past Medical History: No date: Cancer [facility])     Comment:  skin No date: Crohn's disease ([place]) No date: GERD (gastroesophageal reflux disease) No date: Hypertension No date: MRSA (methicillin resistant staph aureus) culture positive     Comment:  cultured during sinus surgery 05/2017 No date: Neutrophilia 10/2017: Pulmonary embolism ([place]) No date: Shingles     Comment:  resolved No date: Wears contact lenses     Comment:  Left eye  Past Surgical History: No date: ABDOMINAL SURGERY No date: BOWEL RESECTION     Comment:  x 3 10/18/2018: CATARACT EXTRACTION W/PHACO; Left     Comment:  Procedure: CATARACT EXTRACTION PHACO AND INTRAOCULAR               LENS PLACEMENT ([place]) LEFT;  Surgeon: Eulogio Bear,               MD;   Location: [place];  Service:               Ophthalmology;  Laterality: Left; 12/06/2018: CATARACT EXTRACTION W/PHACO; Right     Comment:  Procedure: CATARACT EXTRACTION PHACO AND INTRAOCULAR               LENS PLACEMENT ([facility]) RIGHT;  Surgeon: Eulogio Bear,              MD;  Location: [facility];  Service:               Ophthalmology;  Laterality: Right; No date: HYDROCELE EXCISION / REPAIR 05/21/2017: IMAGE GUIDED SINUS SURGERY; N/A     Comment:  Procedure: IMAGE GUIDED SINUS SURGERY;  Surgeon:               Margaretha Sheffield, MD;  Location: [facility];                Service: ENT;  Laterality: N/A;  need stryker disk 05/21/2017: MAXILLARY ANTROSTOMY; Right     Comment:  Procedure: MAXILLARY ANTROSTOMY;  Surgeon: Margaretha Sheffield, MD;  Location: [place];  Service: ENT;               Laterality: Right; 05/21/2017: SEPTOPLASTY; N/A     Comment:  Procedure: SEPTOPLASTY;  Surgeon:  Margaretha Sheffield, MD;                Location: [place];  Service: ENT;                Laterality: N/A;     Reproductive/Obstetrics negative OB ROS                            Anesthesia Physical Anesthesia Plan  ASA: III and emergent  Anesthesia Plan: General ETT and Rapid Sequence   Post-op Pain Management:    Induction:   PONV Risk Score and Plan: Ondansetron, Dexamethasone and Treatment may vary due to age or medical condition  Airway Management Planned:   Additional Equipment:   Intra-op Plan:   Post-operative Plan:   Informed Consent: I have reviewed the patients History and Physical, chart, labs and discussed the procedure including the risks, benefits and alternatives for the proposed anesthesia with the patient or authorized representative who has indicated his/her understanding and acceptance.     Dental Advisory Given  Plan Discussed with: Anesthesiologist, CRNA and Surgeon  Anesthesia Plan Comments:          Anesthesia Quick Evaluation

## 2020-05-03 NOTE — ED Notes (Signed)
Pt resting comfortably at this time. NAD noted. Pt states intermittent nausea, states pain is 5/10. Call bell in reach. Pt denies any further needs at this time.

## 2020-05-03 NOTE — Anesthesia Procedure Notes (Signed)
Procedure Name: Intubation Date/Time: 04/28/2020 12:35 PM Performed by: Johnna Acosta, CRNA Pre-anesthesia Checklist: Patient identified, Emergency Drugs available, Suction available, Patient being monitored and Timeout performed Patient Re-evaluated:Patient Re-evaluated prior to induction Oxygen Delivery Method: Circle system utilized Preoxygenation: Pre-oxygenation with 100% oxygen Induction Type: IV induction, Rapid sequence and Cricoid Pressure applied Laryngoscope Size: McGraph and 3 Grade View: Grade II Tube type: Oral Tube size: 7.5 mm Number of attempts: 1 Airway Equipment and Method: Stylet and Video-laryngoscopy Placement Confirmation: ETT inserted through vocal cords under direct vision,  positive ETCO2 and breath sounds checked- equal and bilateral Secured at: 21 cm Tube secured with: cloth tube holder due to skin condition. Dental Injury: Teeth and Oropharynx as per pre-operative assessment  Difficulty Due To: Difficulty was anticipated, Difficult Airway- due to reduced neck mobility, Difficult Airway- due to limited oral opening and Difficult Airway- due to dentition

## 2020-05-03 NOTE — ED Notes (Signed)
Andexxa running, pt transported to OR at this time

## 2020-05-03 NOTE — ED Provider Notes (Signed)
[facility] Emergency Department Provider Note   ____________________________________________   Event Date/Time   First MD Initiated Contact with Patient 04/13/2020 [phone]     (approximate)  I have reviewed the triage vital signs and the nursing notes.   HISTORY  Chief Complaint Abdominal Pain    HPI Brent Diaz is a 64 y.o. male with history of Crohn's disease who presents for suprapubic abdominal pain that began at approximately 0130 this morning and woke him up from sleep.  Patient describes this pain as a 10/10, constant pressure, that radiates to the left and right side of his lower abdomen and is associated with nausea/vomiting.  Patient has had bloody vomitus since being in our emergency department.  Patient does endorse dark tarry stools last night.  Patient states that he is on Eliquis for previous pulmonary embolism.  Patient is concerned as his abdominal pain is overlying his recent bowel resection secondary to Crohn's.  Patient currently denies any vision changes, tinnitus, difficulty speaking, facial droop, sore throat, chest pain, shortness of breath, diarrhea, dysuria, or weakness/numbness/paresthesias in any extremity         Past Medical History:  Diagnosis Date  . Cancer ([place])    skin  . Crohn's disease ([place])   . GERD (gastroesophageal reflux disease)   . Hypertension   . MRSA (methicillin resistant staph aureus) culture positive    cultured during sinus surgery 05/2017  . Neutrophilia   . Pulmonary embolism ([place]) 10/2017  . Shingles    resolved  . Wears contact lenses    Left eye    Patient Active Problem List   Diagnosis Date Noted  . Aspiration pneumonia ([place]) 05/05/2020  . Malnutrition of moderate degree 05/04/2020  . Perforated bowel ([place]) 04/07/2020  . B12 deficiency 11/30/2018  . GERD (gastroesophageal reflux disease) 11/30/2018  . Vitamin D deficiency 11/30/2018  . Monocytosis 10/14/2018  . Leukocytosis 10/14/2018   . Pulmonary embolism ([place]) 10/14/2017  . Shingles 09/17/2016  . Eye swelling, right 06/30/2016  . Crohn's disease with complication ([place]) 68/03/5725    Past Surgical History:  Procedure Laterality Date  . ABDOMINAL SURGERY    . BOWEL RESECTION     x 3  . CATARACT EXTRACTION W/PHACO Left 10/18/2018   Procedure: CATARACT EXTRACTION PHACO AND INTRAOCULAR LENS PLACEMENT ([place]) LEFT;  Surgeon: Eulogio Bear, MD;  Location: [place];  Service: Ophthalmology;  Laterality: Left;  . CATARACT EXTRACTION W/PHACO Right 12/06/2018   Procedure: CATARACT EXTRACTION PHACO AND INTRAOCULAR LENS PLACEMENT ([facility]) RIGHT;  Surgeon: Eulogio Bear, MD;  Location: [place];  Service: Ophthalmology;  Laterality: Right;  . HYDROCELE EXCISION / REPAIR    . IMAGE GUIDED SINUS SURGERY N/A 05/21/2017   Procedure: IMAGE GUIDED SINUS SURGERY;  Surgeon: Margaretha Sheffield, MD;  Location: [place];  Service: ENT;  Laterality: N/A;  need stryker disk  . LAPAROTOMY N/A 04/26/2020   Procedure: EXPLORATORY LAPAROTOMY;  Surgeon: Benjamine Sprague, DO;  Location: [facility];  Service: General;  Laterality: N/A;  . MAXILLARY ANTROSTOMY Right 05/21/2017   Procedure: MAXILLARY ANTROSTOMY;  Surgeon: Margaretha Sheffield, MD;  Location: [place];  Service: ENT;  Laterality: Right;  . SEPTOPLASTY N/A 05/21/2017   Procedure: SEPTOPLASTY;  Surgeon: Margaretha Sheffield, MD;  Location: [place];  Service: ENT;  Laterality: N/A;    Prior to Admission medications   Medication Sig Start Date End Date Taking? Authorizing Provider  apixaban (ELIQUIS) 5 MG TABS tablet Take 1  tablet (5 mg total) by mouth 2 (two) times daily. Start 6/19 10/21/17   Bettey Costa, MD  B Complex-Folic Acid (B COMPLEX-VITAMIN B12 PO) Take 1,000 mcg by mouth daily.     [provider]  budesonide (ENTOCORT EC) 3 MG 24 hr capsule Take 6 mg by mouth daily.     [provider]  butalbital-acetaminophen-caffeine  (FIORICET) 50-325-40 MG tablet Take 1 tablet by mouth every 4 (four) hours as needed for headache.    [provider]  fluorouracil (EFUDEX) 5 % cream Apply topically. 09/19/19 09/18/20  [provider]  folic acid (FOLVITE) 1 MG tablet Take 1 mg by mouth daily. 06/21/16   [provider]  hyoscyamine (LEVSIN SL) 0.125 MG SL tablet Place 0.125 mg under the tongue every 4 (four) hours as needed.    [provider]  LISINOPRIL-HYDROCHLOROTHIAZIDE PO Take 1 tablet by mouth daily. 5 mg daily    [provider]  loperamide (IMODIUM) 2 MG capsule Take 2 mg by mouth as needed for diarrhea or loose stools.    [provider]  Melatonin 5 MG TABS Take 10 mg by mouth at bedtime as needed.    [provider]  montelukast (SINGULAIR) 10 MG tablet Take 10 mg by mouth every morning.     [provider]  nystatin (MYCOSTATIN) 100000 UNIT/ML suspension Swish and swallow 5 mLs 4 (four) times daily 12/22/19   [provider]  ondansetron (ZOFRAN ODT) 4 MG disintegrating tablet Take 1 tablet (4 mg total) by mouth every 8 (eight) hours as needed for nausea or vomiting. 04/27/18   Darel Hong, MD  pantoprazole (PROTONIX) 40 MG tablet Take 40 mg by mouth 2 (two) times daily. 04/14/16   [provider]  potassium chloride SA (K-DUR,KLOR-CON) 20 MEQ tablet Take 1 tablet by mouth daily.  10/08/17 04/11/20  [provider]  vitamin B-12 (CYANOCOBALAMIN) 1000 MCG tablet Take 1,000 mcg by mouth daily.    [provider]  Vitamin D, Ergocalciferol, (DRISDOL) 50000 units CAPS capsule Take 50,000 Units by mouth every 7 (seven) days.    [provider]  zolpidem (AMBIEN) 10 MG tablet Take 10 mg by mouth at bedtime as needed for sleep. Patient not taking: Reported on 03/27/2020    [provider]    Allergies Amlodipine, Aspirin, and Flagyl [metronidazole]  Family History  Problem Relation Age of Onset  .  Aneurysm Father 60  . Dementia Mother 47    Social History Social History   Tobacco Use  . Smoking status: Current Every Day Smoker    Packs/day: 0.50    Years: 47.00    Pack years: 23.50    Types: Cigarettes  . Smokeless tobacco: Never Used  . Tobacco comment: since age 71  Vaping Use  . Vaping Use: Never used  Substance Use Topics  . Alcohol use: No  . Drug use: Never    Review of Systems Constitutional: No fever/chills Eyes: No visual changes. ENT: No sore throat. Cardiovascular: Denies chest pain. Respiratory: Denies shortness of breath. Gastrointestinal: Endorses abdominal pain.  Endorses nausea/vomiting.  No diarrhea. Genitourinary: Negative for dysuria. Musculoskeletal: Negative for acute arthralgias Skin: Negative for rash. Neurological: Negative for headaches, weakness/numbness/paresthesias in any extremity Psychiatric: Negative for suicidal ideation/homicidal ideation   ____________________________________________   PHYSICAL EXAM:  VITAL SIGNS: ED Triage Vitals  Enc Vitals Group     BP 04/17/2020 0419 (!) 105/54     Pulse Rate 04/13/2020 0419 74  Resp 04/08/2020 0419 20     Temp 04/26/2020 0419 98.2 F (36.8 C)     Temp Source 04/05/2020 0419 Oral     SpO2 04/07/2020 0419 94 %     Weight --      Height --      Head Circumference --      Peak Flow --      Pain Score 04/25/2020 0420 10     Pain Loc --      Pain Edu? --      Excl. in [place]? --    Constitutional: Alert and oriented. Well appearing and in no acute distress. Eyes: Conjunctivae are normal. PERRL. Head: Atraumatic. Nose: No congestion/rhinnorhea. Mouth/Throat: Mucous membranes are moist. Neck: No stridor Cardiovascular: Grossly normal heart sounds.  Good peripheral circulation. Respiratory: Normal respiratory effort.  No retractions. Gastrointestinal: Soft and significant tenderness palpation in the suprapubic region is worse in the left lower quadrant. No distention. Musculoskeletal: No  obvious deformities Neurologic:  Normal speech and language. No gross focal neurologic deficits are appreciated. Skin:  Skin is warm and dry. No rash noted. Psychiatric: Mood and affect are normal. Speech and behavior are normal.  ____________________________________________   LABS (all labs ordered are listed, but only abnormal results are displayed)  Labs Reviewed  COMPREHENSIVE METABOLIC PANEL - Abnormal; Notable for the following components:      Result Value   Glucose, Bld 149 (*)    Creatinine, Ser 0.53 (*)    Calcium 8.6 (*)    Total Protein 5.8 (*)    Albumin 2.7 (*)    All other components within normal limits  CBC - Abnormal; Notable for the following components:   WBC 32.8 (*)    RBC 4.10 (*)    Hemoglobin 12.6 (*)    Platelets 405 (*)    All other components within normal limits  LACTIC ACID, PLASMA - Abnormal; Notable for the following components:   Lactic Acid, Venous 3.8 (*)    All other components within normal limits  CBC - Abnormal; Notable for the following components:   WBC 36.7 (*)    RBC 3.77 (*)    Hemoglobin 11.7 (*)    HCT 36.8 (*)    RDW 15.6 (*)    All other components within normal limits  BLOOD GAS, ARTERIAL - Abnormal; Notable for the following components:   pH, Arterial 7.30 (*)    pCO2 arterial 29 (*)    pO2, Arterial 167 (*)    Bicarbonate 14.3 (*)    Acid-base deficit 10.7 (*)    All other components within normal limits  BLOOD GAS, ARTERIAL - Abnormal; Notable for the following components:   pCO2 arterial 29 (*)    pO2, Arterial 370 (*)    Bicarbonate 16.8 (*)    Acid-base deficit 7.1 (*)    All other components within normal limits  CBC - Abnormal; Notable for the following components:   WBC 36.1 (*)    RBC 3.54 (*)    Hemoglobin 11.2 (*)    HCT 34.1 (*)    RDW 16.0 (*)    All other components within normal limits  BASIC METABOLIC PANEL - Abnormal; Notable for the following components:   Chloride 115 (*)    CO2 20 (*)     Glucose, Bld 142 (*)    Calcium 7.7 (*)    All other components within normal limits  GLUCOSE, CAPILLARY - Abnormal; Notable for the following components:   Glucose-Capillary 141 (*)  All other components within normal limits  GLUCOSE, CAPILLARY - Abnormal; Notable for the following components:   Glucose-Capillary 140 (*)    All other components within normal limits  GLUCOSE, CAPILLARY - Abnormal; Notable for the following components:   Glucose-Capillary 141 (*)    All other components within normal limits  GLUCOSE, CAPILLARY - Abnormal; Notable for the following components:   Glucose-Capillary 143 (*)    All other components within normal limits  GLUCOSE, CAPILLARY - Abnormal; Notable for the following components:   Glucose-Capillary 111 (*)    All other components within normal limits  CBC - Abnormal; Notable for the following components:   WBC 28.8 (*)    RBC 2.76 (*)    Hemoglobin 8.6 (*)    HCT 27.4 (*)    RDW 16.3 (*)    All other components within normal limits  COMPREHENSIVE METABOLIC PANEL - Abnormal; Notable for the following components:   Chloride 115 (*)    CO2 21 (*)    Glucose, Bld 132 (*)    Calcium 8.0 (*)    Total Protein 4.4 (*)    Albumin 2.0 (*)    AST 14 (*)    All other components within normal limits  PREALBUMIN - Abnormal; Notable for the following components:   Prealbumin 8.7 (*)    All other components within normal limits  DIFFERENTIAL - Abnormal; Notable for the following components:   Neutro Abs 24.4 (*)    Monocytes Absolute 1.9 (*)    Abs Immature Granulocytes 1.08 (*)    All other components within normal limits  BRAIN NATRIURETIC PEPTIDE - Abnormal; Notable for the following components:   B Natriuretic Peptide 193.7 (*)    All other components within normal limits  GLUCOSE, CAPILLARY - Abnormal; Notable for the following components:   Glucose-Capillary 175 (*)    All other components within normal limits  CBC - Abnormal; Notable for  the following components:   WBC 32.2 (*)    RBC 3.01 (*)    Hemoglobin 9.4 (*)    HCT 30.0 (*)    RDW 16.3 (*)    All other components within normal limits  BASIC METABOLIC PANEL - Abnormal; Notable for the following components:   Chloride 112 (*)    CO2 15 (*)    Glucose, Bld 156 (*)    Calcium 8.2 (*)    All other components within normal limits  LACTIC ACID, PLASMA - Abnormal; Notable for the following components:   Lactic Acid, Venous 6.8 (*)    All other components within normal limits  LACTIC ACID, PLASMA - Abnormal; Notable for the following components:   Lactic Acid, Venous 2.3 (*)    All other components within normal limits  BLOOD GAS, ARTERIAL - Abnormal; Notable for the following components:   pCO2 arterial 20 (*)    pO2, Arterial 68 (*)    Bicarbonate 13.3 (*)    Acid-base deficit 8.7 (*)    Allens test (pass/fail) ARTERIAL DRAW (*)    All other components within normal limits  LACTIC ACID, PLASMA - Abnormal; Notable for the following components:   Lactic Acid, Venous 2.3 (*)    All other components within normal limits  LACTIC ACID, PLASMA - Abnormal; Notable for the following components:   Lactic Acid, Venous 3.7 (*)    All other components within normal limits  GLUCOSE, CAPILLARY - Abnormal; Notable for the following components:   Glucose-Capillary 117 (*)    All other  components within normal limits  GLUCOSE, CAPILLARY - Abnormal; Notable for the following components:   Glucose-Capillary 131 (*)    All other components within normal limits  RENAL FUNCTION PANEL - Abnormal; Notable for the following components:   Glucose, Bld 117 (*)    Calcium 7.8 (*)    Albumin 2.0 (*)    All other components within normal limits  LACTIC ACID, PLASMA - Abnormal; Notable for the following components:   Lactic Acid, Venous 4.7 (*)    All other components within normal limits  LACTIC ACID, PLASMA - Abnormal; Notable for the following components:   Lactic Acid, Venous 4.2  (*)    All other components within normal limits  GLUCOSE, CAPILLARY - Abnormal; Notable for the following components:   Glucose-Capillary 111 (*)    All other components within normal limits  BLOOD GAS, ARTERIAL - Abnormal; Notable for the following components:   pH, Arterial 7.48 (*)    pCO2 arterial 26 (*)    pO2, Arterial 67 (*)    Bicarbonate 19.4 (*)    Acid-base deficit 3.3 (*)    All other components within normal limits  CBG MONITORING, ED - Abnormal; Notable for the following components:   Glucose-Capillary 124 (*)    All other components within normal limits  TROPONIN I (HIGH SENSITIVITY) - Abnormal; Notable for the following components:   Troponin I (High Sensitivity) 536 (*)    All other components within normal limits  TROPONIN I (HIGH SENSITIVITY) - Abnormal; Notable for the following components:   Troponin I (High Sensitivity) 489 (*)    All other components within normal limits  TROPONIN I (HIGH SENSITIVITY) - Abnormal; Notable for the following components:   Troponin I (High Sensitivity) 474 (*)    All other components within normal limits  RESP PANEL BY RT-PCR (FLU A&B, COVID) ARPGX2  CULTURE, BLOOD (ROUTINE X 2)  CULTURE, BLOOD (ROUTINE X 2)  LIPASE, BLOOD  MAGNESIUM  PHOSPHORUS  TRIGLYCERIDES  MAGNESIUM  PROCALCITONIN  PROCALCITONIN  MAGNESIUM  HEMOGLOBIN A1C  URINALYSIS, COMPLETE (UACMP) WITH MICROSCOPIC  URINALYSIS, ROUTINE W REFLEX MICROSCOPIC  ABO/RH  TYPE AND SCREEN  SURGICAL PATHOLOGY    PROCEDURES  Procedure(s) performed (including Critical Care):  Procedures   ____________________________________________   INITIAL IMPRESSION / ASSESSMENT AND PLAN / ED COURSE  As part of my medical decision making, I reviewed the following data within the electronic MEDICAL RECORD NUMBER Nursing notes reviewed and incorporated, Labs reviewed, EKG interpreted, Old chart reviewed, Radiograph reviewed and Notes from prior ED visits reviewed and  incorporated        Patient is a 64 year old male with a past medical history of Crohn's disease who presents for significant bilateral lower quadrant abdominal pain.  Differential diagnosis for this patient includes but is not limited to: Bowel obstruction, ruptured hollow viscus, intra-abdominal abscess, urinary tract infection, diverticulitis, Crohn's flare, lower GI bleeding.  Care of this patient will be signed out to the oncoming physician at the end of my shift.  All pertinent patient information conveyed and all questions answered.  All further care and disposition decisions will be made by the oncoming physician.      ____________________________________________   FINAL CLINICAL IMPRESSION(S) / ED DIAGNOSES  Final diagnoses:  Small bowel obstruction [facility])     ED Discharge Orders    None       Note:  This document was prepared using Dragon voice recognition software and may include unintentional dictation errors.   Cheri Fowler,  Vista Lawman, MD 05/05/20 1539

## 2020-05-03 NOTE — Anesthesia Procedure Notes (Signed)
Arterial Line Insertion Start/End12/27/2021 12:45 PM, 04/04/2020 12:58 PM Performed by: Tera Mater, MD, anesthesiologist  Patient location: OR. Preanesthetic checklist: patient identified, IV checked, site marked, risks and benefits discussed, surgical consent, monitors and equipment checked, pre-op evaluation, timeout performed and anesthesia consent Left, radial was placed Catheter size: 20 Fr Hand hygiene performed  and maximum sterile barriers used   Attempts: 4 Procedure performed using ultrasound guided technique. Following insertion, dressing applied. Post procedure assessment: unchanged  Patient tolerated the procedure well with no immediate complications.

## 2020-05-03 NOTE — ED Provider Notes (Signed)
----------------------------------------- °  8:58 AM on 04/30/2020 -----------------------------------------  CT shows dilated jejunum and possible internal hernia and/or closed-loop bowel obstruction.  The patient's vital signs remain stable.  His pain is well controlled at this time.  Given the possibility of ischemia, I have added on a lactate.  He has already received antibiotics and fluids.  We will place an NG tube.  I consulted general surgery; Dr. Lysle Pearl is currently in the operating room with the case in progress so I spoke to the [place] her about the consult.   ----------------------------------------- 10:47 AM on 04/06/2020 -----------------------------------------  Dr. Lysle Pearl has evaluated the patient and plans to take him to the OR.  He requires reversal of eliquis and adnexa has been ordered and requested from the pharmacy.  The patient remains hemodynamically stable.         Arta Silence, MD 04/13/2020 1123

## 2020-05-03 NOTE — H&P (Addendum)
Subjective:   CC: closed loop bowel obstruction  HPI:  NORLAN RANN is a 64 y.o. male who was consulted by [facility] for issue above.  Symptoms were first noted a few hours ago. Pain is sharp, confined to the LUQ, without radiation.  Associated with N/V, exacerbated by touch.  Had some occasional abd pain for past few days, until this acute exacerbation few hours ago prior to ED arrival.     Past Medical History:  has a past medical history of Cancer ([place]), Crohn's disease ([place]), GERD (gastroesophageal reflux disease), Hypertension, MRSA (methicillin resistant staph aureus) culture positive, Neutrophilia, Pulmonary embolism ([place]) (10/2017), Shingles, and Wears contact lenses.  Past Surgical History:  Past Surgical History:  Procedure Laterality Date  . ABDOMINAL SURGERY    . BOWEL RESECTION     x 3  . CATARACT EXTRACTION W/PHACO Left 10/18/2018   Procedure: CATARACT EXTRACTION PHACO AND INTRAOCULAR LENS PLACEMENT ([place]) LEFT;  Surgeon: Eulogio Bear, MD;  Location: [place];  Service: Ophthalmology;  Laterality: Left;  . CATARACT EXTRACTION W/PHACO Right 12/06/2018   Procedure: CATARACT EXTRACTION PHACO AND INTRAOCULAR LENS PLACEMENT ([facility]) RIGHT;  Surgeon: Eulogio Bear, MD;  Location: [place];  Service: Ophthalmology;  Laterality: Right;  . HYDROCELE EXCISION / REPAIR    . IMAGE GUIDED SINUS SURGERY N/A 05/21/2017   Procedure: IMAGE GUIDED SINUS SURGERY;  Surgeon: Margaretha Sheffield, MD;  Location: [place];  Service: ENT;  Laterality: N/A;  need stryker disk  . MAXILLARY ANTROSTOMY Right 05/21/2017   Procedure: MAXILLARY ANTROSTOMY;  Surgeon: Margaretha Sheffield, MD;  Location: [place];  Service: ENT;  Laterality: Right;  . SEPTOPLASTY N/A 05/21/2017   Procedure: SEPTOPLASTY;  Surgeon: Margaretha Sheffield, MD;  Location: [place];  Service: ENT;  Laterality: N/A;    Family History: family history includes Aneurysm (age of onset: 82) in his  father; Dementia (age of onset: 1) in his mother.  Social History:  reports that he has been smoking cigarettes. He has a 23.50 pack-year smoking history. He has never used smokeless tobacco. He reports that he does not drink alcohol and does not use drugs.  Current Medications:  Prior to Admission medications   Medication Sig Start Date End Date Taking? Authorizing Provider  apixaban (ELIQUIS) 5 MG TABS tablet Take 1 tablet (5 mg total) by mouth 2 (two) times daily. Start 6/19 10/21/17   Bettey Costa, MD  B Complex-Folic Acid (B COMPLEX-VITAMIN B12 PO) Take 1,000 mcg by mouth daily.     [provider]  budesonide (ENTOCORT EC) 3 MG 24 hr capsule Take 6 mg by mouth daily.     [provider]  butalbital-acetaminophen-caffeine (FIORICET) 50-325-40 MG tablet Take 1 tablet by mouth every 4 (four) hours as needed for headache.    [provider]  fluorouracil (EFUDEX) 5 % cream Apply topically. 09/19/19 09/18/20  [provider]  folic acid (FOLVITE) 1 MG tablet Take 1 mg by mouth daily. 06/21/16   [provider]  hyoscyamine (LEVSIN SL) 0.125 MG SL tablet Place 0.125 mg under the tongue every 4 (four) hours as needed.    [provider]  LISINOPRIL-HYDROCHLOROTHIAZIDE PO Take 1 tablet by mouth daily. 5 mg daily    [provider]  loperamide (IMODIUM) 2 MG capsule Take 2 mg by mouth as needed for diarrhea or loose stools.    [provider]  Melatonin 5 MG TABS Take 10 mg by mouth at bedtime as needed.  [provider]  montelukast (SINGULAIR) 10 MG tablet Take 10 mg by mouth every morning.     [provider]  nystatin (MYCOSTATIN) 100000 UNIT/ML suspension Swish and swallow 5 mLs 4 (four) times daily 12/22/19   [provider]  ondansetron (ZOFRAN ODT) 4 MG disintegrating tablet Take 1 tablet (4 mg total) by mouth every 8 (eight) hours as needed for nausea or vomiting. 04/27/18   Darel Hong, MD   pantoprazole (PROTONIX) 40 MG tablet Take 40 mg by mouth 2 (two) times daily. 04/14/16   [provider]  potassium chloride SA (K-DUR,KLOR-CON) 20 MEQ tablet Take 1 tablet by mouth daily.  10/08/17 04/11/20  [provider]  vitamin B-12 (CYANOCOBALAMIN) 1000 MCG tablet Take 1,000 mcg by mouth daily.    [provider]  Vitamin D, Ergocalciferol, (DRISDOL) 50000 units CAPS capsule Take 50,000 Units by mouth every 7 (seven) days.    [provider]  zolpidem (AMBIEN) 10 MG tablet Take 10 mg by mouth at bedtime as needed for sleep. Patient not taking: Reported on 03/27/2020    [provider]    Allergies:  Allergies as of 04/09/2020 - Review Complete 04/10/2020  Allergen Reaction Noted  . Amlodipine Swelling 05/14/2017  . Aspirin Other (See Comments) 06/26/2016  . Flagyl [metronidazole]  05/21/2017    ROS:  General: Denies weight loss, weight gain, fatigue, fevers, chills, and night sweats. Eyes: Denies blurry vision, double vision, eye pain, itchy eyes, and tearing. Ears: Denies hearing loss, earache, and ringing in ears. Nose: Denies sinus pain, congestion, infections, runny nose, and nosebleeds. Mouth/throat: Denies hoarseness, sore throat, bleeding gums, and difficulty swallowing. Heart: Denies chest pain, palpitations, racing heart, irregular heartbeat, leg pain or swelling, and decreased activity tolerance. Respiratory: Denies breathing difficulty, shortness of breath, wheezing, cough, and sputum. GI: Denies change in appetite, heartburn, constipation, diarrhea, and blood in stool. GU: Denies difficulty urinating, pain with urinating, urgency, frequency, blood in urine. Musculoskeletal: Denies joint stiffness, pain, swelling, muscle weakness. Skin: Denies rash, itching, mass, tumors, sores, and boils Neurologic: Denies headache, fainting, dizziness, seizures, numbness, and tingling. Psychiatric: Denies depression, anxiety, difficulty  sleeping, and memory loss. Endocrine: Denies heat or cold intolerance, and increased thirst or urination. Blood/lymph: Denies easy bruising, easy bruising, and swollen glands     Objective:     BP (!) 137/93   Pulse 81   Temp 98.2 F (36.8 C) (Oral)   Resp (!) 25   SpO2 97%   Constitutional :  alert, cooperative, appears stated age and no distress  Lymphatics/Throat:  no asymmetry, masses, or scars  Respiratory:  clear to auscultation bilaterally  Cardiovascular:  regular rate and rhythm  Gastrointestinal: acute guarding with peritonitis, most TTP in LUQ.   Musculoskeletal: Steady movement  Skin: Cool and moist   Psychiatric: Normal affect, non-agitated, not confused       LABS:  CMP Latest Ref Rng & Units 05/04/2020 03/26/2020 05/25/2019  Glucose 70 - 99 mg/dL 149(H) 107(H) -  BUN 8 - 23 mg/dL 19 9 -  Creatinine 0.61 - 1.24 mg/dL 0.53(L) 0.76 -  Sodium 135 - 145 mmol/L 139 141 -  Potassium 3.5 - 5.1 mmol/L 3.8 4.1 3.9  Chloride 98 - 111 mmol/L 106 103 -  CO2 22 - 32 mmol/L 23 26 -  Calcium 8.9 - 10.3 mg/dL 8.6(L) 8.9 -  Total Protein 6.5 - 8.1 g/dL 5.8(L) 6.2(L) -  Total Bilirubin 0.3 - 1.2 mg/dL 0.5 0.3 -  Alkaline Phos  38 - 126 U/L 56 64 -  AST 15 - 41 U/L 16 18 -  ALT 0 - 44 U/L 15 17 -   CBC Latest Ref Rng & Units 04/12/2020 04/23/2020 03/26/2020  WBC 4.0 - 10.5 K/uL 32.8(H) 23.1(H) 23.7(H)  Hemoglobin 13.0 - 17.0 g/dL 12.6(L) 14.2 14.5  Hematocrit 39.0 - 52.0 % 39.8 42.8 44.0  Platelets 150 - 400 K/uL 405(H) 400 297    RADS: CLINICAL DATA:  Severe abdominal pain for the past 3 hours. Two bowel movements this morning. Previous bowel resection. Crohn's disease.  EXAM: CT ABDOMEN AND PELVIS WITH CONTRAST  TECHNIQUE: Multidetector CT imaging of the abdomen and pelvis was performed using the standard protocol following bolus administration of intravenous contrast.  CONTRAST:  140m OMNIPAQUE IOHEXOL 300 MG/ML  SOLN  COMPARISON:   10/14/2017  FINDINGS: Lower chest: Unremarkable.  Hepatobiliary: Mild diffuse low density of the liver without significant change. Normal appearing gallbladder.  Pancreas: Unremarkable. No pancreatic ductal dilatation or surrounding inflammatory changes.  Spleen: Multiple calcified granulomata.  Adrenals/Urinary Tract: Normal appearing adrenal glands. Multiple bilateral renal cysts. 1.2 cm exophytic mass arising from the upper pole of the left kidney measuring 54 Hounsfield units in density on the initial images and on the delayed images. No urinary tract calculi or hydronephrosis.  Stomach/Bowel: Interval mass-like dilatation of a segment of proximal jejunum containing a mass-like conglomeration of contents and fluid without wall thickening. There is adjacent mesenteric fluid and there is some pinching and swirling of the mesentery medial to this focally dilated loop with an appearance suggesting an internal hernia.  There is mild wall thickening involving the jejunum exiting this area focal dilatation with adjacent mesenteric fluid.  Multiple colonic diverticula with wall thickening and pericolonic stranding and soft tissue density involving the proximal to mid sigmoid colon. The soft tissue density area adjacent to the sigmoid colon is irregular and contains several tiny locules of gas or air. This area measures  5.4 x 4.6 x 3.4 cm.  This was not previously present.  Status post partial right inferior colectomy with staple lines.  Vascular/Lymphatic: Atheromatous arterial calcifications without aneurysm. No enlarged lymph nodes.  Reproductive: Prostate is unremarkable.  Other: Small to moderate amount of free peritoneal fluid, including mesenteric fluid and pericolonic fluid.  Musculoskeletal: Lumbar and lower thoracic spine degenerative changes.  IMPRESSION: 1. Findings suspicious for a closed loop proximal jejunal obstruction due to an internal  hernia. 2. Mild wall thickening involving the jejunum distal to the focally dilated loop of jejunum in the left mid abdomen. This could be due ischemic changes associated with the internal hernia or active Crohn's disease. 3. Findings suspicious for chronic and acute diverticulitis involving the proximal to mid sigmoid colon with associated interval irregular adjacent soft tissue mass measuring 5.4 x 4.6 x 3.4 cm. The mass could represent an area of chronic inflammation and scarring or malignancy. 4. Small to moderate amount of free peritoneal fluid. 5. 1.2 cm medium density exophytic mass arising from the upper pole of the left kidney. This most likely represents a complicated cyst. Malignancy is less likely but not excluded. 6. Mild diffuse hepatic steatosis.  Critical Value/emergent results were called by telephone at the time of interpretation on 04/20/2020 at 8:27 am to Dr. SCherylann Banas who verbally acknowledged these results.   Electronically Signed   By: SClaudie ReveringM.D.   On: 04/08/2020 08:25 Assessment:   Acute abdomen, secondary to likely closed loop obstruction  Hx of PE on eliquis Hx  of Crohn's  Plan:     Discussed pathophisiology and need for emergent surgery.  The risk of surgery include, but not limited to, recurrence, bleeding, chronic pain, post-op infxn, post-op SBO or ileus, hernias, resection of bowel, re-anastamosis, possible ostomy placement and need for re-operation to address said risks. The risks of general anesthetic, if used, includes MI, CVA, sudden death or even reaction to anesthetic medications also discussed. Alternatives include none, Benefits include possible symptom relief, preventing further decline in health and possible death.  Typical post-op recovery time of additional days in hospital for observation afterwards also discussed.  The patient verbalized understanding and all questions were answered to the patient's satisfaction. Pt and wife  both understand high risk nature of operation and possibility of prolonged recovery.   Pt has no advance directive, verbalized wife to be POA. Requested full code status  Notified of closed loop dx on CT at 1030

## 2020-05-03 NOTE — ED Triage Notes (Signed)
Pt arrives via [facility] with c/o Severe abdominal pain for 3 hours. 2 bm this morning. Hx of bowel resection. Hr 68, 156/83, 99% ra, 97.6, ekg unremarkable.

## 2020-05-04 ENCOUNTER — Inpatient Hospital Stay: Payer: Medicare PPO

## 2020-05-04 ENCOUNTER — Other Ambulatory Visit: Payer: Self-pay

## 2020-05-04 ENCOUNTER — Encounter: Payer: Self-pay | Admitting: Surgery

## 2020-05-04 DIAGNOSIS — E440 Moderate protein-calorie malnutrition: Secondary | ICD-10-CM | POA: Insufficient documentation

## 2020-05-04 DIAGNOSIS — R000 Tachycardia, unspecified: Secondary | ICD-10-CM | POA: Diagnosis not present

## 2020-05-04 LAB — BLOOD GAS, ARTERIAL
Acid-base deficit: 8.7 mmol/L — ABNORMAL HIGH (ref 0.0–2.0)
Bicarbonate: 13.3 mmol/L — ABNORMAL LOW (ref 20.0–28.0)
FIO2: 0.21
O2 Saturation: 93.8 %
Patient temperature: 37
pCO2 arterial: 20 mmHg — ABNORMAL LOW (ref 32.0–48.0)
pH, Arterial: 7.43 (ref 7.350–7.450)
pO2, Arterial: 68 mmHg — ABNORMAL LOW (ref 83.0–108.0)

## 2020-05-04 LAB — BASIC METABOLIC PANEL
Anion gap: 8 (ref 5–15)
Anion gap: 15 (ref 5–15)
BUN: 14 mg/dL (ref 8–23)
BUN: 17 mg/dL (ref 8–23)
CO2: 20 mmol/L — ABNORMAL LOW (ref 22–32)
CO2: 15 mmol/L — ABNORMAL LOW (ref 22–32)
Calcium: 7.7 mg/dL — ABNORMAL LOW (ref 8.9–10.3)
Calcium: 8.2 mg/dL — ABNORMAL LOW (ref 8.9–10.3)
Chloride: 115 mmol/L — ABNORMAL HIGH (ref 98–111)
Chloride: 112 mmol/L — ABNORMAL HIGH (ref 98–111)
Creatinine, Ser: 0.73 mg/dL (ref 0.61–1.24)
Creatinine, Ser: 1.06 mg/dL (ref 0.61–1.24)
GFR, Estimated: >60 mL/min (ref >60)
GFR, Estimated: >60 mL/min (ref >60)
Glucose, Bld: 142 mg/dL — ABNORMAL HIGH (ref 70–99)
Glucose, Bld: 156 mg/dL — ABNORMAL HIGH (ref 70–99)
Potassium: 4.4 mmol/L (ref 3.5–5.1)
Potassium: 4 mmol/L (ref 3.5–5.1)
Sodium: 143 mmol/L (ref 135–145)
Sodium: 142 mmol/L (ref 135–145)

## 2020-05-04 LAB — GLUCOSE, CAPILLARY
Glucose-Capillary: 141 mg/dL — ABNORMAL HIGH (ref 70–99)
Glucose-Capillary: 143 mg/dL — ABNORMAL HIGH (ref 70–99)
Glucose-Capillary: 111 mg/dL — ABNORMAL HIGH (ref 70–99)
Glucose-Capillary: 175 mg/dL — ABNORMAL HIGH (ref 70–99)

## 2020-05-04 LAB — MAGNESIUM
Magnesium: 1.9 mg/dL (ref 1.7–2.4)
Magnesium: 1.9 mg/dL (ref 1.7–2.4)

## 2020-05-04 LAB — CBC
HCT: 34.1 % — ABNORMAL LOW (ref 39.0–52.0)
HCT: 30 % — ABNORMAL LOW (ref 39.0–52.0)
Hemoglobin: 11.2 g/dL — ABNORMAL LOW (ref 13.0–17.0)
Hemoglobin: 9.4 g/dL — ABNORMAL LOW (ref 13.0–17.0)
MCH: 31.6 pg (ref 26.0–34.0)
MCH: 31.2 pg (ref 26.0–34.0)
MCHC: 32.8 g/dL (ref 30.0–36.0)
MCHC: 31.3 g/dL (ref 30.0–36.0)
MCV: 96.3 fL (ref 80.0–100.0)
MCV: 99.7 fL (ref 80.0–100.0)
Platelets: 221 10*3/uL (ref 150–400)
Platelets: 203 10*3/uL (ref 150–400)
RBC: 3.54 MIL/uL — ABNORMAL LOW (ref 4.22–5.81)
RBC: 3.01 MIL/uL — ABNORMAL LOW (ref 4.22–5.81)
RDW: 16 % — ABNORMAL HIGH (ref 11.5–15.5)
RDW: 16.3 % — ABNORMAL HIGH (ref 11.5–15.5)
WBC: 36.1 10*3/uL — ABNORMAL HIGH (ref 4.0–10.5)
WBC: 32.2 10*3/uL — ABNORMAL HIGH (ref 4.0–10.5)
nRBC: 0 % (ref 0.0–0.2)
nRBC: 0.1 % (ref 0.0–0.2)

## 2020-05-04 LAB — LACTIC ACID, PLASMA: Lactic Acid, Venous: 6.8 mmol/L (ref 0.5–1.9)

## 2020-05-04 LAB — BRAIN NATRIURETIC PEPTIDE: B Natriuretic Peptide: 193.7 pg/mL — ABNORMAL HIGH (ref 0.0–100.0)

## 2020-05-04 MED ORDER — ORAL CARE MOUTH RINSE
15.0000 mL | Freq: Two times a day (BID) | OROMUCOSAL | Status: DC
  Administered 2020-05-04 – 2020-05-06 (×6): 15 mL via OROMUCOSAL

## 2020-05-04 MED ORDER — MORPHINE SULFATE (PF) 2 MG/ML IV SOLN
1.0000 mg | INTRAVENOUS | Status: DC | PRN
  Administered 2020-05-04 – 2020-05-05 (×4): 1 mg via INTRAVENOUS
  Filled 2020-05-04 (×4): qty 1

## 2020-05-04 MED ORDER — IOHEXOL 350 MG/ML SOLN
75.0000 mL | Freq: Once | INTRAVENOUS | Status: AC | PRN
  Administered 2020-05-04: 75 mL via INTRAVENOUS

## 2020-05-04 MED ORDER — METOPROLOL TARTRATE 5 MG/5ML IV SOLN
INTRAVENOUS | Status: AC
  Administered 2020-05-04: 5 mg
  Filled 2020-05-04: qty 5

## 2020-05-04 MED ORDER — TRAVASOL 10 % IV SOLN
INTRAVENOUS | Status: AC
  Filled 2020-05-04: qty 396

## 2020-05-04 MED ORDER — PROMETHAZINE HCL 25 MG/ML IJ SOLN
12.5000 mg | Freq: Four times a day (QID) | INTRAMUSCULAR | Status: DC | PRN
  Administered 2020-05-04: 12.5 mg via INTRAVENOUS
  Filled 2020-05-04 (×2): qty 1

## 2020-05-04 MED ORDER — SODIUM CHLORIDE 0.45 % IV SOLN: INTRAVENOUS | Status: DC

## 2020-05-04 MED ORDER — KETOROLAC TROMETHAMINE 30 MG/ML IJ SOLN
15.0000 mg | Freq: Four times a day (QID) | INTRAMUSCULAR | Status: DC | PRN
  Administered 2020-05-04 – 2020-05-06 (×2): 15 mg via INTRAVENOUS
  Filled 2020-05-04 (×2): qty 1

## 2020-05-04 MED ORDER — INSULIN ASPART 100 UNIT/ML ~~LOC~~ SOLN
0.0000 [IU] | Freq: Three times a day (TID) | SUBCUTANEOUS | Status: DC
  Administered 2020-05-04: 2 [IU] via SUBCUTANEOUS
  Administered 2020-05-05: 1 [IU] via SUBCUTANEOUS
  Filled 2020-05-04 (×3): qty 1

## 2020-05-04 MED ORDER — OCTREOTIDE ACETATE 100 MCG/ML IJ SOLN
50.0000 ug | Freq: Three times a day (TID) | INTRAMUSCULAR | Status: DC
  Administered 2020-05-04 – 2020-05-09 (×15): 50 ug via SUBCUTANEOUS
  Filled 2020-05-04 (×22): qty 0.5

## 2020-05-04 NOTE — Consult Note (Signed)
PHARMACY - TOTAL PARENTERAL NUTRITION CONSULT NOTE   Indication: Fistula  Patient Measurements: Height: 5' 10"  (177.8 cm) Weight: 81.1 kg (178 lb 12.7 oz) IBW/kg (Calculated) : 73 TPN AdjBW (KG): 81.1 Body mass index is 25.65 kg/m.  Assessment:  Patient is a 64 y/o M with medical history including Crohn's disease, history of PE, GERD who presented to the ED 12/30 with severe abdominal pain. Patient went for emergent exploratory laparotomy with small bowel resection and lysis of adhesions. 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   Glucose / Insulin: No apparent history of diabetes. BG 141 - 143 last 24h. Patient did receive one dose of dexamethasone yesterday.  Electrolytes: Hyperchloremia Renal: Scr 0.53 >> 0.73 since admission LFTs / TGs: Transaminases within normal limits. TG ordered for tomorrow AM. Prealbumin / albumin: Albumin 2.7 on 12/30 Intake / Output; MIVF: Net + 2.7L this admission. NS at 100 mL/hr GI Imaging: 12/30 CT Abdomen Pelvis:   Suspicious for a closed loop proximal jejunal obstruction due to an internal hernia.  Suspicious for chronic and acute diverticulitis involving the proximal to mid sigmoid colon with associated interval irregular adjacent soft tissue mass measuring 5.4 x 4.6 x 3.4 cm.  Small to moderate amount of free peritoneal fluid. Surgeries / Procedures:  12/30 Exploratory laparotomy with small bowel resection and lysis of adhesions  Central access: 12/30 TPN start date: 12/31  Nutritional Goals (per RD recommendation on 12/31): kCal: 2100 - 2300, Protein: 110-120g, Fluid: 2.2 - 2.6L Goal TPN rate is 100 mL/hr (provides 120 g of protein and 2227 kcals per day)  Current Nutrition:  NPO  Plan:  --Start TPN at 1/3 goal rate at 33 mL/hr at 1800  Fluid: 792 mL  Protein: 39.6 g  Dextrose: 134.6 g  Lipids: 11.9 g  Kcal: 735 --Electrolytes in TPN: 12mq/L of Na, 537m/L of K, 60m70mL of Ca, 60mE46m of Mg, and 160mm73m  of Phos. Cl:Ac ratio 1:1 --Add standard MVI and trace elements to TPN, add thiamine for re-feed risk --Initiate Sensitive q8h SSI and adjust as needed  --Reduce MIVF to 70 mL/hr at 1800  Adjust MIVF based on TPN rate for goal 100 mL/hr  Discussed with surgeon, will change MIVF from NS to 1/2 NS given hyperchloremia --Monitor TPN labs on Mon/Thurs, daily until stable --Given lipid emulsion shortage, will reduce lipid content in TPN to target 15% of total daily kCals from SMOFlipid instead of 30% to align with guidance put forth. AA and dextrose content increased to meet calorie requirements.   Benelli Winther Benita Gutter1/2021,8:51 AM

## 2020-05-04 NOTE — Consult Note (Signed)
Reason for Consult:svt Referring Physician:  [name] is an 64 y.o. male.  HPI: Patient is a 64  Y/o male with pmh of history of Cancer ([place]), Crohn's disease , GERD, Hypertension, MRSA, Neutrophilia, Pulmonary embolism (10/2017) on anticoagulation, who on 12/30 diagnosed with  acute abdomen, secondary to likely closed loop obstruction, he then underwent eliquis and was taken to the OR. Patient is now hospital stay day 1, 1 Day Post-Op ex-lap, small bowel resection. Patient course this evening was complicated by acute onset of tachycardia associated with acute shortness of breath. Per RN/NP patient was noted be in afib with rvr . Patient was given 5 mg iv with  Drop in HR and conversion to sinus. EKG s/p  Treatment noted sinus tachy at rate of 130s. Current vitals  110/70, with hr 118, rr 18, sat . Patient currently notes resolution of his shortness of breath and currently notes no chest pain, nausea or abdominal pain. Of note per rn patient has had decrease urine out over the last 8 hours of note he is net negative 600 cc.,and per chart has had 45cc/hr of urine. He has had notable losses by ng/drains of around 800 cc over the shift.   Past Medical History:  Diagnosis Date  . Cancer ([place])    skin  . Crohn's disease ([place])   . GERD (gastroesophageal reflux disease)   . Hypertension   . MRSA (methicillin resistant staph aureus) culture positive    cultured during sinus surgery 05/2017  . Neutrophilia   . Pulmonary embolism ([place]) 10/2017  . Shingles    resolved  . Wears contact lenses    Left eye    Past Surgical History:  Procedure Laterality Date  . ABDOMINAL SURGERY    . BOWEL RESECTION     x 3  . CATARACT EXTRACTION W/PHACO Left 10/18/2018   Procedure: CATARACT EXTRACTION PHACO AND INTRAOCULAR LENS PLACEMENT ([place]) LEFT;  Surgeon: Eulogio Bear, MD;  Location: [place];  Service: Ophthalmology;  Laterality: Left;  . CATARACT EXTRACTION W/PHACO Right 12/06/2018    Procedure: CATARACT EXTRACTION PHACO AND INTRAOCULAR LENS PLACEMENT ([facility]) RIGHT;  Surgeon: Eulogio Bear, MD;  Location: [place];  Service: Ophthalmology;  Laterality: Right;  . HYDROCELE EXCISION / REPAIR    . IMAGE GUIDED SINUS SURGERY N/A 05/21/2017   Procedure: IMAGE GUIDED SINUS SURGERY;  Surgeon: Margaretha Sheffield, MD;  Location: [place];  Service: ENT;  Laterality: N/A;  need stryker disk  . LAPAROTOMY N/A 04/15/2020   Procedure: EXPLORATORY LAPAROTOMY;  Surgeon: Benjamine Sprague, DO;  Location: [facility];  Service: General;  Laterality: N/A;  . MAXILLARY ANTROSTOMY Right 05/21/2017   Procedure: MAXILLARY ANTROSTOMY;  Surgeon: Margaretha Sheffield, MD;  Location: [place];  Service: ENT;  Laterality: Right;  . SEPTOPLASTY N/A 05/21/2017   Procedure: SEPTOPLASTY;  Surgeon: Margaretha Sheffield, MD;  Location: [facility] [facility];  Service: ENT;  Laterality: N/A;    Family History  Problem Relation Age of Onset  . Aneurysm Father 25  . Dementia Mother 66    Social History:  reports that he has been smoking cigarettes. He has a 23.50 pack-year smoking history. He has never used smokeless tobacco. He reports that he does not drink alcohol and does not use drugs.  Allergies:  Allergies  Allergen Reactions  . Amlodipine Swelling    feet  . Aspirin Other (See Comments)    Peptic ulcers  . Flagyl [Metronidazole]     Gives neuropathy  if taken too long      Results for orders placed or performed during the hospital encounter of 04/11/2020 (from the past 48 hour(s))  Lipase, blood     Status: None   Collection Time: 04/27/2020  4:26 AM  Result Value Ref Range   Lipase 20 11 - 51 U/L    Comment: Performed at [facility], [address] [place], [place] [address]  Comprehensive metabolic panel     Status: Abnormal   Collection Time: 04/06/2020  4:26 AM  Result Value Ref Range   Sodium 139 135 - 145 mmol/L   Potassium 3.8 3.5 - 5.1 mmol/L   Chloride 106 98 -  111 mmol/L   CO2 23 22 - 32 mmol/L   Glucose, Bld 149 (H) 70 - 99 mg/dL    Comment: Glucose reference range applies only to samples taken after fasting for at least 8 hours.   BUN 19 8 - 23 mg/dL   Creatinine, Ser 0.53 (L) 0.61 - 1.24 mg/dL   Calcium 8.6 (L) 8.9 - 10.3 mg/dL   Total Protein 5.8 (L) 6.5 - 8.1 g/dL   Albumin 2.7 (L) 3.5 - 5.0 g/dL   AST 16 15 - 41 U/L   ALT 15 0 - 44 U/L   Alkaline Phosphatase 56 38 - 126 U/L   Total Bilirubin 0.5 0.3 - 1.2 mg/dL   GFR, Estimated >60 >60 mL/min    Comment: (NOTE) Calculated using the CKD-EPI Creatinine Equation (2021)    Anion gap 10 5 - 15    Comment: Performed at [facility], [address] [place], [place] [address]  CBC     Status: Abnormal   Collection Time: 04/20/2020  4:26 AM  Result Value Ref Range   WBC 32.8 (H) 4.0 - 10.5 K/uL   RBC 4.10 (L) 4.22 - 5.81 MIL/uL   Hemoglobin 12.6 (L) 13.0 - 17.0 g/dL   HCT 39.8 39.0 - 52.0 %   MCV 97.1 80.0 - 100.0 fL   MCH 30.7 26.0 - 34.0 pg   MCHC 31.7 30.0 - 36.0 g/dL   RDW 15.5 11.5 - 15.5 %   Platelets 405 (H) 150 - 400 K/uL   nRBC 0.0 0.0 - 0.2 %    Comment: Performed at [facility], [address] [place], [place] [address]  Lactic acid, plasma     Status: Abnormal   Collection Time: 04/05/2020  8:46 AM  Result Value Ref Range   Lactic Acid, Venous 3.8 (HH) 0.5 - 1.9 mmol/L    Comment: CRITICAL RESULT CALLED TO, READ BACK BY AND VERIFIED WITH Gwynn Burly RN AT [phone] ON 05/02/2020 SNG Performed at [facility], [address] [place], [place] [address]   Resp Panel by RT-PCR (Flu A&B, Covid) Nasopharyngeal Swab     Status: None   Collection Time: 04/05/2020 10:40 AM   Specimen: Nasopharyngeal Swab; Nasopharyngeal(NP) swabs in vial transport medium  Result Value Ref Range   SARS Coronavirus 2 by RT PCR NEGATIVE NEGATIVE    Comment: (NOTE) SARS-CoV-2 target nucleic acids are NOT DETECTED.  The SARS-CoV-2 RNA is generally detectable in upper  respiratory specimens during the acute phase of infection. The lowest concentration of SARS-CoV-2 viral copies this assay can detect is 138 copies/mL. A negative result does not preclude SARS-Cov-2 infection and should not be used as the sole basis for treatment or other patient management decisions. A negative result may occur with  improper specimen collection/handling, submission of  specimen other than nasopharyngeal swab, presence of viral mutation(s) within the areas targeted by this assay, and inadequate number of viral copies(<138 copies/mL). A negative result must be combined with clinical observations, patient history, and epidemiological information. The expected result is Negative.  Fact Sheet for Patients:  EntrepreneurPulse.com.au  Fact Sheet for Healthcare Providers:  IncredibleEmployment.be  This test is no t yet approved or cleared by the [place] FDA and  has been authorized for detection and/or diagnosis of SARS-CoV-2 by FDA under an Emergency Use Authorization (EUA). This EUA will remain  in effect (meaning this test can be used) for the duration of the COVID-19 declaration under Section 564(b)(1) of the Act, 21 U.S.C.section 360bbb-3(b)(1), unless the authorization is terminated  or revoked sooner.       Influenza A by PCR NEGATIVE NEGATIVE   Influenza B by PCR NEGATIVE NEGATIVE    Comment: (NOTE) The Xpert Xpress SARS-CoV-2/FLU/RSV plus assay is intended as an aid in the diagnosis of influenza from Nasopharyngeal swab specimens and should not be used as a sole basis for treatment. Nasal washings and aspirates are unacceptable for Xpert Xpress SARS-CoV-2/FLU/RSV testing.  Fact Sheet for Patients: EntrepreneurPulse.com.au  Fact Sheet for Healthcare Providers: IncredibleEmployment.be  This test is not yet approved or cleared by the [place] FDA and has been authorized for  detection and/or diagnosis of SARS-CoV-2 by FDA under an Emergency Use Authorization (EUA). This EUA will remain in effect (meaning this test can be used) for the duration of the COVID-19 declaration under Section 564(b)(1) of the Act, 21 U.S.C. section 360bbb-3(b)(1), unless the authorization is terminated or revoked.  Performed at [facility], [address] [place], [place] [address]   ABO/Rh     Status: None   Collection Time: 04/30/2020  1:09 PM  Result Value Ref Range   ABO/RH(D)      O POS Performed at [facility], [address] [place], [place] [address]   CBC     Status: Abnormal   Collection Time: 04/14/2020  1:11 PM  Result Value Ref Range   WBC 36.7 (H) 4.0 - 10.5 K/uL   RBC 3.77 (L) 4.22 - 5.81 MIL/uL   Hemoglobin 11.7 (L) 13.0 - 17.0 g/dL   HCT 36.8 (L) 39.0 - 52.0 %   MCV 97.6 80.0 - 100.0 fL   MCH 31.0 26.0 - 34.0 pg   MCHC 31.8 30.0 - 36.0 g/dL   RDW 15.6 (H) 11.5 - 15.5 %   Platelets 361 150 - 400 K/uL   nRBC 0.1 0.0 - 0.2 %    Comment: Performed at [facility], [address] [place], [place] [address]  Type and screen [place]     Status: None   Collection Time: 04/15/2020  2:00 PM  Result Value Ref Range   ABO/RH(D) O POS    Antibody Screen NEG    Sample Expiration      05/06/2020,2359 Performed at [facility], [address] [place], [place] [address]   Blood gas, arterial     Status: Abnormal   Collection Time: 04/25/2020  3:52 PM  Result Value Ref Range   FIO2 0.99    Delivery systems HI FLOW NASAL CANNULA     Comment: 55 LPM    pH, Arterial 7.30 (L) 7.350 - 7.450   pCO2 arterial 29 (L) 32.0 - 48.0 mmHg   pO2, Arterial 167 (H) 83.0 - 108.0 mmHg   Bicarbonate 14.3 (L) 20.0 -  28.0 mmol/L   Acid-base deficit 10.7 (H) 0.0 - 2.0 mmol/L   O2 Saturation 99.4 %   Patient temperature 37.0    Collection site A-LINE    Sample type ARTERIAL DRAW     Comment: Performed at [facility], [address] [place], [place] [address]  Blood gas, arterial     Status: Abnormal   Collection Time: 04/23/2020  5:05 PM  Result Value Ref Range   FIO2 0.92    pH, Arterial 7.37 7.350 - 7.450   pCO2 arterial 29 (L) 32.0 - 48.0 mmHg   pO2, Arterial 370 (H) 83.0 - 108.0 mmHg   Bicarbonate 16.8 (L) 20.0 - 28.0 mmol/L   Acid-base deficit 7.1 (H) 0.0 - 2.0 mmol/L   O2 Saturation 100.0 %   Patient temperature 37.0    Collection site A-LINE    Sample type ARTERIAL DRAW     Comment: Performed at [facility], [address] [place], [place] [address]  CBG monitoring, ED     Status: Abnormal   Collection Time: 04/09/2020  6:30 PM  Result Value Ref Range   Glucose-Capillary 124 (H) 70 - 99 mg/dL    Comment: Glucose reference range applies only to samples taken after fasting for at least 8 hours.  Glucose, capillary     Status: Abnormal   Collection Time: 04/16/2020  7:22 PM  Result Value Ref Range   Glucose-Capillary 141 (H) 70 - 99 mg/dL    Comment: Glucose reference range applies only to samples taken after fasting for at least 8 hours.  Glucose, capillary     Status: Abnormal   Collection Time: 04/15/2020 11:28 PM  Result Value Ref Range   Glucose-Capillary 140 (H) 70 - 99 mg/dL    Comment: Glucose reference range applies only to samples taken after fasting for at least 8 hours.  Glucose, capillary     Status: Abnormal   Collection Time: 05/04/20  3:31 AM  Result Value Ref Range   Glucose-Capillary 141 (H) 70 - 99 mg/dL    Comment: Glucose reference range applies only to samples taken after fasting for at least 8 hours.  CBC     Status: Abnormal   Collection Time: 05/04/20  6:30 AM  Result Value Ref Range   WBC 36.1 (H) 4.0 - 10.5 K/uL   RBC 3.54 (L) 4.22 - 5.81 MIL/uL   Hemoglobin 11.2 (L) 13.0 - 17.0 g/dL   HCT 34.1 (L) 39.0 - 52.0 %   MCV 96.3 80.0 - 100.0 fL   MCH 31.6 26.0 - 34.0 pg   MCHC 32.8 30.0 - 36.0 g/dL   RDW 16.0 (H) 11.5 - 15.5 %   Platelets  221 150 - 400 K/uL   nRBC 0.0 0.0 - 0.2 %    Comment: Performed at [facility], [address], [place], [place] [address]  Basic metabolic panel     Status: Abnormal   Collection Time: 05/04/20  6:30 AM  Result Value Ref Range   Sodium 143 135 - 145 mmol/L   Potassium 4.4 3.5 - 5.1 mmol/L    Comment: HEMOLYSIS AT THIS LEVEL MAY AFFECT RESULT   Chloride 115 (H) 98 - 111 mmol/L   CO2 20 (L) 22 - 32 mmol/L   Glucose, Bld 142 (H) 70 - 99 mg/dL    Comment: Glucose reference range applies only to samples taken after fasting for at least 8 hours.   BUN 14 8 - 23 mg/dL  Creatinine, Ser 0.73 0.61 - 1.24 mg/dL   Calcium 7.7 (L) 8.9 - 10.3 mg/dL   GFR, Estimated >60 >60 mL/min    Comment: (NOTE) Calculated using the CKD-EPI Creatinine Equation (2021)    Anion gap 8 5 - 15    Comment: Performed at [facility], [address] [place], [place] [address]  Glucose, capillary     Status: Abnormal   Collection Time: 05/04/20  7:37 AM  Result Value Ref Range   Glucose-Capillary 143 (H) 70 - 99 mg/dL    Comment: Glucose reference range applies only to samples taken after fasting for at least 8 hours.  Glucose, capillary     Status: Abnormal   Collection Time: 05/04/20 12:03 PM  Result Value Ref Range   Glucose-Capillary 111 (H) 70 - 99 mg/dL    Comment: Glucose reference range applies only to samples taken after fasting for at least 8 hours.  Brain natriuretic peptide     Status: Abnormal   Collection Time: 05/04/20 10:07 PM  Result Value Ref Range   B Natriuretic Peptide 193.7 (H) 0.0 - 100.0 pg/mL    Comment: Performed at [facility], [address] [place], [place] [address]  Glucose, capillary     Status: Abnormal   Collection Time: 05/04/20 10:11 PM  Result Value Ref Range   Glucose-Capillary 175 (H) 70 - 99 mg/dL    Comment: Glucose reference range applies only to samples taken after fasting for at least 8 hours.  CBC     Status: Abnormal   Collection  Time: 05/04/20 10:37 PM  Result Value Ref Range   WBC 32.2 (H) 4.0 - 10.5 K/uL   RBC 3.01 (L) 4.22 - 5.81 MIL/uL   Hemoglobin 9.4 (L) 13.0 - 17.0 g/dL   HCT 30.0 (L) 39.0 - 52.0 %   MCV 99.7 80.0 - 100.0 fL   MCH 31.2 26.0 - 34.0 pg   MCHC 31.3 30.0 - 36.0 g/dL   RDW 16.3 (H) 11.5 - 15.5 %   Platelets 203 150 - 400 K/uL   nRBC 0.1 0.0 - 0.2 %    Comment: Performed at [facility], [address], [place], [place] [address]  Basic metabolic panel     Status: Abnormal   Collection Time: 05/04/20 10:37 PM  Result Value Ref Range   Sodium 142 135 - 145 mmol/L   Potassium 4.0 3.5 - 5.1 mmol/L   Chloride 112 (H) 98 - 111 mmol/L   CO2 15 (L) 22 - 32 mmol/L   Glucose, Bld 156 (H) 70 - 99 mg/dL    Comment: Glucose reference range applies only to samples taken after fasting for at least 8 hours.   BUN 17 8 - 23 mg/dL   Creatinine, Ser 1.06 0.61 - 1.24 mg/dL   Calcium 8.2 (L) 8.9 - 10.3 mg/dL   GFR, Estimated >60 >60 mL/min    Comment: (NOTE) Calculated using the CKD-EPI Creatinine Equation (2021)    Anion gap 15 5 - 15    Comment: Performed at [facility], [address] [place], [place] [address]  Magnesium     Status: None   Collection Time: 05/04/20 10:37 PM  Result Value Ref Range   Magnesium 1.9 1.7 - 2.4 mg/dL    Comment: Performed at [facility], [address], [place], [place] [address]  Blood gas, arterial     Status: Abnormal   Collection Time: 05/04/20 10:57 PM  Result Value Ref Range  FIO2 0.21    pH, Arterial 7.43 7.350 - 7.450   pCO2 arterial 20 (L) 32.0 - 48.0 mmHg    Comment: CRITICAL RESULT CALLED TO, READ BACK BY AND VERIFIED WITH: BRENDA MORRISON AT 1125P 05/04/20 BY TMB    pO2, Arterial 68 (L) 83.0 - 108.0 mmHg    Comment: CRITICAL RESULT CALLED TO, READ BACK BY AND VERIFIED WITH: BRENDA MORRISON, NP AT 1125P 05/04/20 BY TMB    Bicarbonate 13.3 (L) 20.0 - 28.0 mmol/L   Acid-base deficit 8.7 (H) 0.0 - 2.0 mmol/L   O2  Saturation 93.8 %   Patient temperature 37.0    Collection site A-LINE    Sample type ARTERIAL DRAW    Allens test (pass/fail) ARTERIAL DRAW (A) PASS    Comment: Performed at [facility], [address] [place], [place] [address]  Magnesium     Status: None   Collection Time: 05/04/20 11:10 PM  Result Value Ref Range   Magnesium 1.9 1.7 - 2.4 mg/dL    Comment: Performed at [facility], [address] [place], [place] [address]    CT Abdomen Pelvis W Contrast  Result Date: 05/02/2020 CLINICAL DATA:  Severe abdominal pain for the past 3 hours. Two bowel movements this morning. Previous bowel resection. Crohn's disease. EXAM: CT ABDOMEN AND PELVIS WITH CONTRAST TECHNIQUE: Multidetector CT imaging of the abdomen and pelvis was performed using the standard protocol following bolus administration of intravenous contrast. CONTRAST:  144m OMNIPAQUE IOHEXOL 300 MG/ML  SOLN COMPARISON:  10/14/2017 FINDINGS: Lower chest: Unremarkable. Hepatobiliary: Mild diffuse low density of the liver without significant change. Normal appearing gallbladder. Pancreas: Unremarkable. No pancreatic ductal dilatation or surrounding inflammatory changes. Spleen: Multiple calcified granulomata. Adrenals/Urinary Tract: Normal appearing adrenal glands. Multiple bilateral renal cysts. 1.2 cm exophytic mass arising from the upper pole of the left kidney measuring 54 Hounsfield units in density on the initial images and on the delayed images. No urinary tract calculi or hydronephrosis. Stomach/Bowel: Interval mass-like dilatation of a segment of proximal jejunum containing a mass-like conglomeration of contents and fluid without wall thickening. There is adjacent mesenteric fluid and there is some pinching and swirling of the mesentery medial to this focally dilated loop with an appearance suggesting an internal hernia. There is mild wall thickening involving the jejunum exiting this area focal dilatation with  adjacent mesenteric fluid. Multiple colonic diverticula with wall thickening and pericolonic stranding and soft tissue density involving the proximal to mid sigmoid colon. The soft tissue density area adjacent to the sigmoid colon is irregular and contains several tiny locules of gas or air. This area measures 5.4 x 4.6 x 3.4 cm.  This was not previously present. Status post partial right inferior colectomy with staple lines. Vascular/Lymphatic: Atheromatous arterial calcifications without aneurysm. No enlarged lymph nodes. Reproductive: Prostate is unremarkable. Other: Small to moderate amount of free peritoneal fluid, including mesenteric fluid and pericolonic fluid. Musculoskeletal: Lumbar and lower thoracic spine degenerative changes. IMPRESSION: 1. 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 2. Mild wall thickening involving the jejunum distal to the focally dilated loop of jejunum in the left mid abdomen. This could be due ischemic changes associated with the internal hernia or active Crohn's disease. 3. Findings suspicious for chronic and acute diverticulitis involving the proximal to mid sigmoid colon with associated interval irregular adjacent soft tissue mass measuring 5.4 x 4.6 x 3.4 cm. The mass could represent an area of chronic inflammation and scarring  or malignancy. 4. Small to moderate amount of free peritoneal fluid. 5. 1.2 cm medium density exophytic mass arising from the upper pole of the left kidney. This most likely represents a complicated cyst. Malignancy is less likely but not excluded. 6. Mild diffuse hepatic steatosis. Critical Value/emergent results were called by telephone at the time of interpretation on 04/30/2020 at 8:27 am to Dr. Cherylann Banas, who verbally acknowledged these results. Electronically Signed   By: Claudie Revering M.D.   On: 04/30/2020 08:25   DG CHEST PORT 1 VIEW  Result Date: 05/04/2020 CLINICAL DATA:  Tachycardia and  shortness of breath EXAM: PORTABLE CHEST 1 VIEW COMPARISON:  04/27/2018 FINDINGS: A right central venous catheter has been placed with tip over the mid SVC region. No pneumothorax. An enteric tube is present with tip projected at or just below the expected location of the EG junction. Proximal side hole is projected in the lower esophagus region. Advancement is suggested. Mild cardiac enlargement. Small left pleural effusion with infiltration or atelectasis in the left lung base. Surgical clips at the EG junction region. IMPRESSION: Enteric tube tip is at or just below the expected location of the EG junction. Advancement is suggested. Small left pleural effusion with infiltration or atelectasis in the left lung base. Electronically Signed   By: Lucienne Capers M.D.   On: 05/04/2020 22:29   [place] EKG SITE RITE  Result Date: 05/04/2020 If Site Rite image not attached, placement could not be confirmed due to current cardiac rhythm.   Review of Systems  Constitutional: Positive for fatigue. Negative for chills and fever.  HENT: Negative for congestion.   Respiratory: Positive for shortness of breath. Negative for cough.   Cardiovascular: Negative for chest pain.  Gastrointestinal: Positive for abdominal pain. Negative for nausea.  Genitourinary: Positive for decreased urine volume.  Skin: Positive for wound.       Surgical   Neurological: Negative for headaches.  Psychiatric/Behavioral: Negative.    Blood pressure 128/75, pulse (!) 118, temperature 99.6 F (37.6 C), temperature source Oral, resp. rate (!) 28, height 5' 10"  (1.778 m), weight 74.9 kg, SpO2 95 %. Physical Exam Constitutional:      Appearance: He is ill-appearing.  HENT:     Head: Normocephalic and atraumatic.  Eyes:     Extraocular Movements: Extraocular movements intact.  Cardiovascular:     Rate and Rhythm: Regular rhythm. Tachycardia present.  Pulmonary:     Effort: Pulmonary effort is normal. No respiratory distress.      Breath sounds: Normal breath sounds. No stridor.  Abdominal:     General: Abdomen is protuberant. A surgical scar is present. Bowel sounds are normal.     Comments: Surgical suture line in tact  +bs, appropriate post op tenderness   Skin:    General: Skin is warm and dry.     Coloration: Skin is not cyanotic or mottled.     Findings: No erythema.  Neurological:     General: No focal deficit present.     Mental Status: He is alert and oriented to person, place, and time.     Motor: No weakness.  Psychiatric:        Mood and Affect: Mood normal.     Assessment/Plan: Sinus tachycardia with pvc  Post op -per RN and NP has ? Short episode of afib  -not able to be captured by ekg  -concern for sepsis , PE since patient is of anticoagulation, as well as low volume status/electrolyte imbalances with noted  losses -Patient s/p metoprolol 5 mg iv  - at this time sinus 118/physiological -give ns bolus now 500cc x 2  -Agree with CTPA to r/o pe -cycle ce to be complete , doubt ischemia as cause  -repeat ekg now with lower rate and in am  -r/o sepsis check inflammatory markers , lactic acid/procal  -at time would not adjust antibiotics , monitor fever curve , send blood cultures  On call NP will follow up labs, replete electrolytes as needed and implement further treatment plan based on results    Clance Boll 05/04/2020, 11:49 PM

## 2020-05-04 NOTE — Progress Notes (Signed)
Subjective:  CC: Brent Diaz is a 64 y.o. male  Hospital stay day 1, 1 Day Post-Op ex-lap, small bowel resection   HPI: Off pressors, continues to wean off oxygen.  Still having decent abdominal pain.  ROS:  General: Denies weight loss, weight gain, fatigue, fevers, chills, and night sweats. Heart: Denies chest pain, palpitations, racing heart, irregular heartbeat, leg pain or swelling, and decreased activity tolerance. Respiratory: Denies breathing difficulty, shortness of breath, wheezing, cough, and sputum. GI: Denies change in appetite, heartburn, nausea, vomiting, constipation, diarrhea, and blood in stool. GU: Denies difficulty urinating, pain with urinating, urgency, frequency, blood in urine.   Objective:   Temp:  [97.2 F (36.2 C)-98.9 F (37.2 C)] 98.8 F (37.1 C) (12/31 0806) Pulse Rate:  [37-130] 98 (12/31 0806) Resp:  [16-28] 25 (12/31 0806) BP: (96-157)/(64-114) 128/75 (12/31 0600) SpO2:  [97 %-100 %] 99 % (12/31 0806) Arterial Line BP: (105-226)/(64-108) 187/79 (12/31 0806) FiO2 (%):  [31 %-99 %] 31 % (12/31 0806) Weight:  [81.1 kg] 81.1 kg (12/30 1853)     Height: 5' 10"  (177.8 cm) Weight: 81.1 kg BMI (Calculated): 25.65   Intake/Output this shift:   Intake/Output Summary (Last 24 hours) at 05/04/2020 0935 Last data filed at 05/04/2020 1638 Gross per 24 hour  Intake 4219.81 ml  Output 2475 ml  Net 1744.81 ml    Constitutional :  alert, cooperative, appears stated age and mild distress  Respiratory:  clear to auscultation bilaterally  Cardiovascular:  regular rate and rhythm  Gastrointestinal: soft, no guarding, but TTP all four quadrant.  midline incision c/d/i.JP with frank bile.  NG wil dark bilious, old bloody output   Skin: Cool and moist.   Psychiatric: Normal affect, non-agitated, not confused       LABS:  CMP Latest Ref Rng & Units 05/04/2020 04/12/2020 03/26/2020  Glucose 70 - 99 mg/dL 142(H) 149(H) 107(H)  BUN 8 - 23 mg/dL 14 19 9    Creatinine 0.61 - 1.24 mg/dL 0.73 0.53(L) 0.76  Sodium 135 - 145 mmol/L 143 139 141  Potassium 3.5 - 5.1 mmol/L 4.4 3.8 4.1  Chloride 98 - 111 mmol/L 115(H) 106 103  CO2 22 - 32 mmol/L 20(L) 23 26  Calcium 8.9 - 10.3 mg/dL 7.7(L) 8.6(L) 8.9  Total Protein 6.5 - 8.1 g/dL - 5.8(L) 6.2(L)  Total Bilirubin 0.3 - 1.2 mg/dL - 0.5 0.3  Alkaline Phos 38 - 126 U/L - 56 64  AST 15 - 41 U/L - 16 18  ALT 0 - 44 U/L - 15 17   CBC Latest Ref Rng & Units 05/04/2020 05/02/2020 04/04/2020  WBC 4.0 - 10.5 K/uL 36.1(H) 36.7(H) 32.8(H)  Hemoglobin 13.0 - 17.0 g/dL 11.2(L) 11.7(L) 12.6(L)  Hematocrit 39.0 - 52.0 % 34.1(L) 36.8(L) 39.8  Platelets 150 - 400 K/uL 221 361 405(H)    RADS: n/a Assessment:   S/p ex-lap, proximal jejunal resection and re-anastamosis for perforted 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  Unfortunately already leaking bile, likely due to the chronically inflammed tissue.  Vitals remain stable, with no need for pressor support at this time.  Respiration status improving with high flow [place].  Due to location of perforation, previous abdominal surgeries with significant anatomical variation, and baseline crohn's that is casuing chronic unhealthy bowel tissue for manipulation, will try to avoid another surgery with supportive care.  TPN, strict NPO, NG decompression, JP drain to control leak, octreotide in hopes of slowing output.  Will continue  foley for strict I&O, IV abx, lyte replacements.  Hx of PE, on eliquis.  Will hold anticoagulation for now since NG output still looks like old blood.  hgb is stable otherwise, which is reassuring.  Will continue IV protonix in the meantime.    Chronic leukocytosis-  Holding budenoside for now, likely a combination now of acute and chronic elevation.  Will continue to monitor.  Wife and patient both updated of situation and discussed the potential long term care he may need regarding his care.  They both  verbalized understanding.

## 2020-05-04 NOTE — Consult Note (Incomplete)
Reason for Consult:svt Referring Physician:  [name] is an 64 y.o. male.  HPI: Patient is a 64  Y/o male with pmh of history of Cancer (Brent Diaz), Crohn's disease , GERD, Hypertension, MRSA, Neutrophilia, Pulmonary embolism (10/2017) on anticoagulation, who on 12/30 diagnosed with  acute abdomen, secondary to likely closed loop obstruction, he then underwent eliquis and was taken to the OR. Patient is now hospital stay day 1, 1 Day Post-Op ex-lap, small bowel resection. Patient course this evening was complicated by acute onset of tachycardia associated with acute shortness of breath. Per RN/NP patient was noted be in afib with rvr . Patient was given 5 mg iv with  Drop in HR and conversion to sinus. EKG s/p  Treatment noted sinus tachy at rate of 130s. Vitals   Past Medical History:  Diagnosis Date  . Cancer ([place])    skin  . Crohn's disease ([place])   . GERD (gastroesophageal reflux disease)   . Hypertension   . MRSA (methicillin resistant staph aureus) culture positive    cultured during sinus surgery 05/2017  . Neutrophilia   . Pulmonary embolism ([place]) 10/2017  . Shingles    resolved  . Wears contact lenses    Left eye    Past Surgical History:  Procedure Laterality Date  . ABDOMINAL SURGERY    . BOWEL RESECTION     x 3  . CATARACT EXTRACTION W/PHACO Left 10/18/2018   Procedure: CATARACT EXTRACTION PHACO AND INTRAOCULAR LENS PLACEMENT ([place]) LEFT;  Surgeon: Eulogio Bear, MD;  Location: [place];  Service: Ophthalmology;  Laterality: Left;  . CATARACT EXTRACTION W/PHACO Right 12/06/2018   Procedure: CATARACT EXTRACTION PHACO AND INTRAOCULAR LENS PLACEMENT ([facility]) RIGHT;  Surgeon: Eulogio Bear, MD;  Location: [place];  Service: Ophthalmology;  Laterality: Right;  . HYDROCELE EXCISION / REPAIR    . IMAGE GUIDED SINUS SURGERY N/A 05/21/2017   Procedure: IMAGE GUIDED SINUS SURGERY;  Surgeon: Margaretha Sheffield, MD;  Location: [place];  Service: ENT;   Laterality: N/A;  need stryker disk  . LAPAROTOMY N/A 04/13/2020   Procedure: EXPLORATORY LAPAROTOMY;  Surgeon: Benjamine Sprague, DO;  Location: [facility];  Service: General;  Laterality: N/A;  . MAXILLARY ANTROSTOMY Right 05/21/2017   Procedure: MAXILLARY ANTROSTOMY;  Surgeon: Margaretha Sheffield, MD;  Location: [facility];  Service: ENT;  Laterality: Right;  . SEPTOPLASTY N/A 05/21/2017   Procedure: SEPTOPLASTY;  Surgeon: Margaretha Sheffield, MD;  Location: [facility];  Service: ENT;  Laterality: N/A;    Family History  Problem Relation Age of Onset  . Aneurysm Father 35  . Dementia Mother 40    Social History:  reports that he has been smoking cigarettes. He has a 23.50 pack-year smoking history. He has never used smokeless tobacco. He reports that he does not drink alcohol and does not use drugs.  Allergies:  Allergies  Allergen Reactions  . Amlodipine Swelling    feet  . Aspirin Other (See Comments)    Peptic ulcers  . Flagyl [Metronidazole]     Gives neuropathy if taken too long     Medications: {medication reviewed/display:3041432}  Results for orders placed or performed during the hospital encounter of 05/02/2020 (from the past 48 hour(s))  Lipase, blood     Status: None   Collection Time: 04/05/2020  4:26 AM  Result Value Ref Range   Lipase 20 11 - 51 U/L    Comment: Performed at [facility], [address] [place],  [place] [address]  Comprehensive metabolic panel     Status: Abnormal   Collection Time: 04/15/2020  4:26 AM  Result Value Ref Range   Sodium 139 135 - 145 mmol/L   Potassium 3.8 3.5 - 5.1 mmol/L   Chloride 106 98 - 111 mmol/L   CO2 23 22 - 32 mmol/L   Glucose, Bld 149 (H) 70 - 99 mg/dL    Comment: Glucose reference range applies only to samples taken after fasting for at least 8 hours.   BUN 19 8 - 23 mg/dL   Creatinine, Ser 0.53 (L) 0.61 - 1.24 mg/dL   Calcium 8.6 (L) 8.9 - 10.3 mg/dL   Total Protein 5.8 (L) 6.5 - 8.1 g/dL   Albumin 2.7  (L) 3.5 - 5.0 g/dL   AST 16 15 - 41 U/L   ALT 15 0 - 44 U/L   Alkaline Phosphatase 56 38 - 126 U/L   Total Bilirubin 0.5 0.3 - 1.2 mg/dL   GFR, Estimated >60 >60 mL/min    Comment: (NOTE) Calculated using the CKD-EPI Creatinine Equation (2021)    Anion gap 10 5 - 15    Comment: Performed at [facility], [address] [place], [place] [address]  CBC     Status: Abnormal   Collection Time: 04/24/2020  4:26 AM  Result Value Ref Range   WBC 32.8 (H) 4.0 - 10.5 K/uL   RBC 4.10 (L) 4.22 - 5.81 MIL/uL   Hemoglobin 12.6 (L) 13.0 - 17.0 g/dL   HCT 39.8 39.0 - 52.0 %   MCV 97.1 80.0 - 100.0 fL   MCH 30.7 26.0 - 34.0 pg   MCHC 31.7 30.0 - 36.0 g/dL   RDW 15.5 11.5 - 15.5 %   Platelets 405 (H) 150 - 400 K/uL   nRBC 0.0 0.0 - 0.2 %    Comment: Performed at [facility], [address] [place], [place] [address]  Lactic acid, plasma     Status: Abnormal   Collection Time: 04/16/2020  8:46 AM  Result Value Ref Range   Lactic Acid, Venous 3.8 (HH) 0.5 - 1.9 mmol/L    Comment: CRITICAL RESULT CALLED TO, READ BACK BY AND VERIFIED WITH Brent Burly RN AT [phone] ON 04/26/2020 SNG Performed at [facility], [address], [place], [place] [address]   Resp Panel by RT-PCR (Flu A&B, Covid) Nasopharyngeal Swab     Status: None   Collection Time: 04/22/2020 10:40 AM   Specimen: Nasopharyngeal Swab; Nasopharyngeal(NP) swabs in vial transport medium  Result Value Ref Range   SARS Coronavirus 2 by RT PCR NEGATIVE NEGATIVE    Comment: (NOTE) SARS-CoV-2 target nucleic acids are NOT DETECTED.  The SARS-CoV-2 RNA is generally detectable in upper respiratory specimens during the acute phase of infection. The lowest concentration of SARS-CoV-2 viral copies this assay can detect is 138 copies/mL. A negative result does not preclude SARS-Cov-2 infection and should not be used as the sole basis for treatment or other patient management decisions. A negative result may occur with   improper specimen collection/handling, submission of specimen other than nasopharyngeal swab, presence of viral mutation(s) within the areas targeted by this assay, and inadequate number of viral copies(<138 copies/mL). A negative result must be combined with clinical observations, patient history, and epidemiological information. The expected result is Negative.  Fact Sheet for Patients:  EntrepreneurPulse.com.au  Fact Sheet for Healthcare Providers:  IncredibleEmployment.be  This test is no t yet approved or cleared by the [place]  States FDA and  has been authorized for detection and/or diagnosis of SARS-CoV-2 by FDA under an Emergency Use Authorization (EUA). This EUA will remain  in effect (meaning this test can be used) for the duration of the COVID-19 declaration under Section 564(b)(1) of the Act, 21 U.S.C.section 360bbb-3(b)(1), unless the authorization is terminated  or revoked sooner.       Influenza A by PCR NEGATIVE NEGATIVE   Influenza B by PCR NEGATIVE NEGATIVE    Comment: (NOTE) The Xpert Xpress SARS-CoV-2/FLU/RSV plus assay is intended as an aid in the diagnosis of influenza from Nasopharyngeal swab specimens and should not be used as a sole basis for treatment. Nasal washings and aspirates are unacceptable for Xpert Xpress SARS-CoV-2/FLU/RSV testing.  Fact Sheet for Patients: EntrepreneurPulse.com.au  Fact Sheet for Healthcare Providers: IncredibleEmployment.be  This test is not yet approved or cleared by the [place] FDA and has been authorized for detection and/or diagnosis of SARS-CoV-2 by FDA under an Emergency Use Authorization (EUA). This EUA will remain in effect (meaning this test can be used) for the duration of the COVID-19 declaration under Section 564(b)(1) of the Act, 21 U.S.C. section 360bbb-3(b)(1), unless the authorization is terminated or revoked.  Performed at  [facility], [address] [place], [place] [address]   ABO/Rh     Status: None   Collection Time: 04/05/2020  1:09 PM  Result Value Ref Range   ABO/RH(D)      O POS Performed at [facility], [address] [place], [place] [address]   CBC     Status: Abnormal   Collection Time: 04/24/2020  1:11 PM  Result Value Ref Range   WBC 36.7 (H) 4.0 - 10.5 K/uL   RBC 3.77 (L) 4.22 - 5.81 MIL/uL   Hemoglobin 11.7 (L) 13.0 - 17.0 g/dL   HCT 36.8 (L) 39.0 - 52.0 %   MCV 97.6 80.0 - 100.0 fL   MCH 31.0 26.0 - 34.0 pg   MCHC 31.8 30.0 - 36.0 g/dL   RDW 15.6 (H) 11.5 - 15.5 %   Platelets 361 150 - 400 K/uL   nRBC 0.1 0.0 - 0.2 %    Comment: Performed at [facility], [address] [place], [place] [address]  Type and screen Susan Moore     Status: None   Collection Time: 04/12/2020  2:00 PM  Result Value Ref Range   ABO/RH(D) O POS    Antibody Screen NEG    Sample Expiration      05/06/2020,2359 Performed at [facility], [address] [place], [place] [address]   Blood gas, arterial     Status: Abnormal   Collection Time: 04/29/2020  3:52 PM  Result Value Ref Range   FIO2 0.99    Delivery systems HI FLOW NASAL CANNULA     Comment: 55 LPM    pH, Arterial 7.30 (L) 7.350 - 7.450   pCO2 arterial 29 (L) 32.0 - 48.0 mmHg   pO2, Arterial 167 (H) 83.0 - 108.0 mmHg   Bicarbonate 14.3 (L) 20.0 - 28.0 mmol/L   Acid-base deficit 10.7 (H) 0.0 - 2.0 mmol/L   O2 Saturation 99.4 %   Patient temperature 37.0    Collection site A-LINE    Sample type ARTERIAL DRAW     Comment: Performed at [facility], [address], [place], [place] [address]  Blood gas, arterial     Status: Abnormal   Collection Time: 04/06/2020  5:05  PM  Result Value Ref Range   FIO2 0.92    pH, Arterial 7.37 7.350 - 7.450   pCO2 arterial 29 (L) 32.0 - 48.0 mmHg   pO2, Arterial 370 (H) 83.0 - 108.0 mmHg   Bicarbonate 16.8 (L) 20.0 - 28.0 mmol/L    Acid-base deficit 7.1 (H) 0.0 - 2.0 mmol/L   O2 Saturation 100.0 %   Patient temperature 37.0    Collection site A-LINE    Sample type ARTERIAL DRAW     Comment: Performed at [facility], [address] [place], [place] [address]  CBG monitoring, ED     Status: Abnormal   Collection Time: 04/21/2020  6:30 PM  Result Value Ref Range   Glucose-Capillary 124 (H) 70 - 99 mg/dL    Comment: Glucose reference range applies only to samples taken after fasting for at least 8 hours.  Glucose, capillary     Status: Abnormal   Collection Time: 04/26/2020  7:22 PM  Result Value Ref Range   Glucose-Capillary 141 (H) 70 - 99 mg/dL    Comment: Glucose reference range applies only to samples taken after fasting for at least 8 hours.  Glucose, capillary     Status: Abnormal   Collection Time: 04/28/2020 11:28 PM  Result Value Ref Range   Glucose-Capillary 140 (H) 70 - 99 mg/dL    Comment: Glucose reference range applies only to samples taken after fasting for at least 8 hours.  Glucose, capillary     Status: Abnormal   Collection Time: 05/04/20  3:31 AM  Result Value Ref Range   Glucose-Capillary 141 (H) 70 - 99 mg/dL    Comment: Glucose reference range applies only to samples taken after fasting for at least 8 hours.  CBC     Status: Abnormal   Collection Time: 05/04/20  6:30 AM  Result Value Ref Range   WBC 36.1 (H) 4.0 - 10.5 K/uL   RBC 3.54 (L) 4.22 - 5.81 MIL/uL   Hemoglobin 11.2 (L) 13.0 - 17.0 g/dL   HCT 34.1 (L) 39.0 - 52.0 %   MCV 96.3 80.0 - 100.0 fL   MCH 31.6 26.0 - 34.0 pg   MCHC 32.8 30.0 - 36.0 g/dL   RDW 16.0 (H) 11.5 - 15.5 %   Platelets 221 150 - 400 K/uL   nRBC 0.0 0.0 - 0.2 %    Comment: Performed at [facility], [address] [place], [place] [address]  Basic metabolic panel     Status: Abnormal   Collection Time: 05/04/20  6:30 AM  Result Value Ref Range   Sodium 143 135 - 145 mmol/L   Potassium 4.4 3.5 - 5.1 mmol/L    Comment: HEMOLYSIS AT THIS  LEVEL MAY AFFECT RESULT   Chloride 115 (H) 98 - 111 mmol/L   CO2 20 (L) 22 - 32 mmol/L   Glucose, Bld 142 (H) 70 - 99 mg/dL    Comment: Glucose reference range applies only to samples taken after fasting for at least 8 hours.   BUN 14 8 - 23 mg/dL   Creatinine, Ser 0.73 0.61 - 1.24 mg/dL   Calcium 7.7 (L) 8.9 - 10.3 mg/dL   GFR, Estimated >60 >60 mL/min    Comment: (NOTE) Calculated using the CKD-EPI Creatinine Equation (2021)    Anion gap 8 5 - 15    Comment: Performed at [facility], [address] [place], [place] [address]  Glucose, capillary     Status: Abnormal  Collection Time: 05/04/20  7:37 AM  Result Value Ref Range   Glucose-Capillary 143 (H) 70 - 99 mg/dL    Comment: Glucose reference range applies only to samples taken after fasting for at least 8 hours.  Glucose, capillary     Status: Abnormal   Collection Time: 05/04/20 12:03 PM  Result Value Ref Range   Glucose-Capillary 111 (H) 70 - 99 mg/dL    Comment: Glucose reference range applies only to samples taken after fasting for at least 8 hours.  Brain natriuretic peptide     Status: Abnormal   Collection Time: 05/04/20 10:07 PM  Result Value Ref Range   B Natriuretic Peptide 193.7 (H) 0.0 - 100.0 pg/mL    Comment: Performed at [facility], [address] [place], [place] [address]  Glucose, capillary     Status: Abnormal   Collection Time: 05/04/20 10:11 PM  Result Value Ref Range   Glucose-Capillary 175 (H) 70 - 99 mg/dL    Comment: Glucose reference range applies only to samples taken after fasting for at least 8 hours.  CBC     Status: Abnormal   Collection Time: 05/04/20 10:37 PM  Result Value Ref Range   WBC 32.2 (H) 4.0 - 10.5 K/uL   RBC 3.01 (L) 4.22 - 5.81 MIL/uL   Hemoglobin 9.4 (L) 13.0 - 17.0 g/dL   HCT 30.0 (L) 39.0 - 52.0 %   MCV 99.7 80.0 - 100.0 fL   MCH 31.2 26.0 - 34.0 pg   MCHC 31.3 30.0 - 36.0 g/dL   RDW 16.3 (H) 11.5 - 15.5 %   Platelets 203 150 - 400 K/uL   nRBC  0.1 0.0 - 0.2 %    Comment: Performed at [facility], [address] [place], [place] [address]  Basic metabolic panel     Status: Abnormal   Collection Time: 05/04/20 10:37 PM  Result Value Ref Range   Sodium 142 135 - 145 mmol/L   Potassium 4.0 3.5 - 5.1 mmol/L   Chloride 112 (H) 98 - 111 mmol/L   CO2 15 (L) 22 - 32 mmol/L   Glucose, Bld 156 (H) 70 - 99 mg/dL    Comment: Glucose reference range applies only to samples taken after fasting for at least 8 hours.   BUN 17 8 - 23 mg/dL   Creatinine, Ser 1.06 0.61 - 1.24 mg/dL   Calcium 8.2 (L) 8.9 - 10.3 mg/dL   GFR, Estimated >60 >60 mL/min    Comment: (NOTE) Calculated using the CKD-EPI Creatinine Equation (2021)    Anion gap 15 5 - 15    Comment: Performed at [facility], [address] [place], [place] [address]  Magnesium     Status: None   Collection Time: 05/04/20 10:37 PM  Result Value Ref Range   Magnesium 1.9 1.7 - 2.4 mg/dL    Comment: Performed at [facility], [address] [place], [place] [address]  Blood gas, arterial     Status: Abnormal   Collection Time: 05/04/20 10:57 PM  Result Value Ref Range   FIO2 0.21    pH, Arterial 7.43 7.350 - 7.450   pCO2 arterial 20 (L) 32.0 - 48.0 mmHg    Comment: CRITICAL RESULT CALLED TO, READ BACK BY AND VERIFIED WITH: BRENDA MORRISON AT 1125P 05/04/20 BY TMB    pO2, Arterial 68 (L) 83.0 - 108.0 mmHg    Comment: CRITICAL RESULT CALLED TO, READ BACK BY AND VERIFIED WITH: BRENDA MORRISON,  NP AT 1125P 05/04/20 BY TMB    Bicarbonate 13.3 (L) 20.0 - 28.0 mmol/L   Acid-base deficit 8.7 (H) 0.0 - 2.0 mmol/L   O2 Saturation 93.8 %   Patient temperature 37.0    Collection site A-LINE    Sample type ARTERIAL DRAW    Allens test (pass/fail) ARTERIAL DRAW (A) PASS    Comment: Performed at [facility], [address] [place], [place] [address]  Magnesium     Status: None   Collection Time: 05/04/20 11:10 PM  Result Value Ref Range    Magnesium 1.9 1.7 - 2.4 mg/dL    Comment: Performed at [facility], [address] [place], [place] [address]    CT Abdomen Pelvis W Contrast  Result Date: 04/17/2020 CLINICAL DATA:  Severe abdominal pain for the past 3 hours. Two bowel movements this morning. Previous bowel resection. Crohn's disease. EXAM: CT ABDOMEN AND PELVIS WITH CONTRAST TECHNIQUE: Multidetector CT imaging of the abdomen and pelvis was performed using the standard protocol following bolus administration of intravenous contrast. CONTRAST:  136m OMNIPAQUE IOHEXOL 300 MG/ML  SOLN COMPARISON:  10/14/2017 FINDINGS: Lower chest: Unremarkable. Hepatobiliary: Mild diffuse low density of the liver without significant change. Normal appearing gallbladder. Pancreas: Unremarkable. No pancreatic ductal dilatation or surrounding inflammatory changes. Spleen: Multiple calcified granulomata. Adrenals/Urinary Tract: Normal appearing adrenal glands. Multiple bilateral renal cysts. 1.2 cm exophytic mass arising from the upper pole of the left kidney measuring 54 Hounsfield units in density on the initial images and on the delayed images. No urinary tract calculi or hydronephrosis. Stomach/Bowel: Interval mass-like dilatation of a segment of proximal jejunum containing a mass-like conglomeration of contents and fluid without wall thickening. There is adjacent mesenteric fluid and there is some pinching and swirling of the mesentery medial to this focally dilated loop with an appearance suggesting an internal hernia. There is mild wall thickening involving the jejunum exiting this area focal dilatation with adjacent mesenteric fluid. Multiple colonic diverticula with wall thickening and pericolonic stranding and soft tissue density involving the proximal to mid sigmoid colon. The soft tissue density area adjacent to the sigmoid colon is irregular and contains several tiny locules of gas or air. This area measures 5.4 x 4.6 x 3.4 cm.  This was  not previously present. Status post partial right inferior colectomy with staple lines. Vascular/Lymphatic: Atheromatous arterial calcifications without aneurysm. No enlarged lymph nodes. Reproductive: Prostate is unremarkable. Other: Small to moderate amount of free peritoneal fluid, including mesenteric fluid and pericolonic fluid. Musculoskeletal: Lumbar and lower thoracic spine degenerative changes. IMPRESSION: 1. 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 2. Mild wall thickening involving the jejunum distal to the focally dilated loop of jejunum in the left mid abdomen. This could be due ischemic changes associated with the internal hernia or active Crohn's disease. 3. Findings suspicious for chronic and acute diverticulitis involving the proximal to mid sigmoid colon with associated interval irregular adjacent soft tissue mass measuring 5.4 x 4.6 x 3.4 cm. The mass could represent an area of chronic inflammation and scarring or malignancy. 4. Small to moderate amount of free peritoneal fluid. 5. 1.2 cm medium density exophytic mass arising from the upper pole of the left kidney. This most likely represents a complicated cyst. Malignancy is less likely but not excluded. 6. Mild diffuse hepatic steatosis. Critical Value/emergent results were called by telephone at the time of interpretation on 04/27/2020 at 8:27 am to Dr. SCherylann Banas who verbally acknowledged  these results. Electronically Signed   By: Claudie Revering M.D.   On: 04/15/2020 08:25   DG CHEST PORT 1 VIEW  Result Date: 05/04/2020 CLINICAL DATA:  Tachycardia and shortness of breath EXAM: PORTABLE CHEST 1 VIEW COMPARISON:  04/27/2018 FINDINGS: A right central venous catheter has been placed with tip over the mid SVC region. No pneumothorax. An enteric tube is present with tip projected at or just below the expected location of the EG junction. Proximal side hole is projected in the lower esophagus  region. Advancement is suggested. Mild cardiac enlargement. Small left pleural effusion with infiltration or atelectasis in the left lung base. Surgical clips at the EG junction region. IMPRESSION: Enteric tube tip is at or just below the expected location of the EG junction. Advancement is suggested. Small left pleural effusion with infiltration or atelectasis in the left lung base. Electronically Signed   By: Lucienne Capers M.D.   On: 05/04/2020 22:29   [place] EKG SITE RITE  Result Date: 05/04/2020 If Site Rite image not attached, placement could not be confirmed due to current cardiac rhythm.   Review of Systems  Constitutional: Positive for fatigue. Negative for chills and fever.  HENT: Negative for congestion.   Respiratory: Positive for shortness of breath. Negative for cough.   Cardiovascular: Negative for chest pain.  Gastrointestinal: Positive for abdominal pain. Negative for nausea.  Genitourinary: Positive for decreased urine volume.  Skin: Positive for wound.       Surgical   Neurological: Negative for headaches.  Psychiatric/Behavioral: Negative.    Blood pressure 128/75, pulse (!) 118, temperature 99.6 F (37.6 C), temperature source Oral, resp. rate (!) 28, height 5' 10"  (1.778 m), weight 74.9 kg, SpO2 95 %. Physical Exam Constitutional:      Appearance: He is ill-appearing.  HENT:     Head: Normocephalic and atraumatic.  Eyes:     Extraocular Movements: Extraocular movements intact.  Cardiovascular:     Rate and Rhythm: Regular rhythm. Tachycardia present.  Pulmonary:     Effort: Pulmonary effort is normal. No respiratory distress.     Breath sounds: Normal breath sounds. No stridor.  Abdominal:     General: Abdomen is protuberant. A surgical scar is present. Bowel sounds are normal.     Comments: Surgical suture line in tact  +bs, appropriate post op tenderness   Skin:    General: Skin is warm and dry.     Coloration: Skin is not cyanotic or mottled.      Findings: No erythema.  Neurological:     General: No focal deficit present.     Mental Status: He is alert and oriented to person, place, and time.     Motor: No weakness.  Psychiatric:        Mood and Affect: Mood normal.     Assessment/Plan: ***  Clance Boll 05/04/2020, 11:49 PM

## 2020-05-04 NOTE — Anesthesia Postprocedure Evaluation (Signed)
Anesthesia Post Note  Patient: Brent Diaz  Procedure(s) Performed: EXPLORATORY LAPAROTOMY (N/A )  Patient location during evaluation: ICU Anesthesia Type: General Level of consciousness: awake and alert Pain management: pain level controlled Vital Signs Assessment: post-procedure vital signs reviewed and stable Respiratory status: spontaneous breathing, nonlabored ventilation and respiratory function stable Cardiovascular status: blood pressure returned to baseline and stable Anesthetic complications: no   No complications documented.   Last Vitals:  Vitals:   05/04/20 1500 05/04/20 1600  BP:    Pulse: (!) 118 (!) 116  Resp: (!) 28 (!) 28  Temp:  37.6 C  SpO2: 97% 95%    Last Pain:  Vitals:   05/04/20 1600  TempSrc: Axillary  PainSc: [name]

## 2020-05-04 NOTE — Progress Notes (Addendum)
Initial Nutrition Assessment  DOCUMENTATION CODES:   Non-severe (moderate) malnutrition in context of chronic illness  INTERVENTION:  Initiate TPN per pharmacy.  Monitor magnesium, potassium, and phosphorus daily for at least 3 days, MD to replete as needed, as pt is at risk for refeeding syndrome given moderate malnutrition.  NUTRITION DIAGNOSIS:   Moderate Malnutrition related to chronic illness (Crohn's disease) as evidenced by moderate fat depletion,moderate muscle depletion,severe muscle depletion.  GOAL:   Patient will meet greater than or equal to 90% of their needs  MONITOR:   Diet advancement,Labs,Weight trends,Skin,I & O's  REASON FOR ASSESSMENT:   Malnutrition Screening Tool,Consult New TPN/TNA  ASSESSMENT:   64 year old male with PMHx of HTN, Crohn's disease, GERD, shingles, hx bowel resection x 3 admitted with closed-loop bowel obstruction with perforated bowel s/p exploratory laparotomy, small bowel resection, and lysis of adhesions on 12/30.   Discussed with surgeon via secure chat. Patient may require long-term TPN as he has a biliary leak not amenable to surgical repair.  Met with patient at bedside. He is in pain today and tired so unable to provide a very thorough history. Patient with 18 Fr. NGT to LIS. He reports his appetite was decreased PTA and he had been eating less and losing weight. Unable to provide any details at this time. Patient with hx of Crohn's. He endorses he has had 3 bowel resections prior to this but he is unsure how much was removed or how much bowel he has remaining. Noted right eye somewhat swollen. He reports he has a hx of shingles to right side of face.  Patient endorses weight loss but he is unable to provide specifics at this time. Per review of chart patient was 80.3 kg on 10/18/2018, 79.2 kg on 12/06/2018, 77.9 kg on 03/27/2020, 74.8 kg (164.56 lbs) on 04/23/2020. Suspect current weight if 81.1 kg is not accurate but will continue to  monitor.  Medications reviewed and include: octreotide, Protonix, NS at 100 mL/hr, Zosyn.  Labs reviewed: CBG 143, Chloride 115, CO2 20.  I/O: 1000 mL UOP Yesterday (0.5 mL/kg/hr)  Pending PICC placement today.  Discussed with pharmacy.  NUTRITION - FOCUSED PHYSICAL EXAM:  Flowsheet Row Most Recent Value  Orbital Region Moderate depletion  Upper Arm Region Moderate depletion  Thoracic and Lumbar Region Unable to assess  Buccal Region Moderate depletion  Temple Region Severe depletion  Clavicle Bone Region Moderate depletion  Clavicle and Acromion Bone Region Moderate depletion  Scapular Bone Region Mild depletion  Dorsal Hand No depletion  Patellar Region Moderate depletion  Anterior Thigh Region Moderate depletion  Posterior Calf Region Severe depletion  Edema (RD Assessment) Mild  Hair Reviewed  Eyes Reviewed  Mouth Reviewed  Skin Reviewed  Nails Reviewed     Diet Order:   Diet Order            Diet NPO time specified  Diet effective now                EDUCATION NEEDS:   No education needs have been identified at this time  Skin:  Skin Assessment: Skin Integrity Issues: Skin Integrity Issues:: Incisions Incisions: closed incision to abdomen  Last BM:  Unknown  Height:   Ht Readings from Last 1 Encounters:  04/16/2020 5' 10"  (1.778 m)   Weight:   Wt Readings from Last 1 Encounters:  04/19/2020 81.1 kg   Ideal Body Weight:  75.5 kg  BMI:  Body mass index is 25.65 kg/m.  Estimated  Nutritional Needs:   Kcal:  2100-2300  Protein:  110-120 grams  Fluid:  2.2-2.6 L/day  Jacklynn Barnacle, MS, RD, LDN Pager number available on Amion

## 2020-05-05 ENCOUNTER — Inpatient Hospital Stay: Payer: Medicare PPO

## 2020-05-05 ENCOUNTER — Inpatient Hospital Stay: Payer: Self-pay

## 2020-05-05 ENCOUNTER — Inpatient Hospital Stay (HOSPITAL_COMMUNITY)
Admit: 2020-05-05 | Discharge: 2020-05-05 | Disposition: A | Discharge status: Home / Self Care | Payer: Medicare PPO | Attending: Cardiovascular Disease | Admitting: Cardiovascular Disease

## 2020-05-05 DIAGNOSIS — K56609 Unspecified intestinal obstruction, unspecified as to partial versus complete obstruction: Secondary | ICD-10-CM | POA: Diagnosis not present

## 2020-05-05 DIAGNOSIS — J690 Pneumonitis due to inhalation of food and vomit: Secondary | ICD-10-CM | POA: Diagnosis not present

## 2020-05-05 DIAGNOSIS — K631 Perforation of intestine (nontraumatic): Secondary | ICD-10-CM | POA: Diagnosis not present

## 2020-05-05 DIAGNOSIS — E440 Moderate protein-calorie malnutrition: Secondary | ICD-10-CM | POA: Diagnosis not present

## 2020-05-05 DIAGNOSIS — I472 Ventricular tachycardia: Secondary | ICD-10-CM

## 2020-05-05 DIAGNOSIS — I480 Paroxysmal atrial fibrillation: Secondary | ICD-10-CM | POA: Diagnosis not present

## 2020-05-05 DIAGNOSIS — I361 Nonrheumatic tricuspid (valve) insufficiency: Secondary | ICD-10-CM | POA: Diagnosis not present

## 2020-05-05 DIAGNOSIS — I4891 Unspecified atrial fibrillation: Secondary | ICD-10-CM

## 2020-05-05 DIAGNOSIS — R0602 Shortness of breath: Secondary | ICD-10-CM

## 2020-05-05 LAB — DIFFERENTIAL
Abs Immature Granulocytes: 1.08 10*3/uL — ABNORMAL HIGH (ref 0.00–0.07)
Basophils Absolute: 0.1 10*3/uL (ref 0.0–0.1)
Basophils Relative: 0 %
Eosinophils Absolute: 0.1 10*3/uL (ref 0.0–0.5)
Eosinophils Relative: 0 %
Immature Granulocytes: 4 %
Lymphocytes Relative: 5 %
Lymphs Abs: 1.3 10*3/uL (ref 0.7–4.0)
Monocytes Absolute: 1.9 10*3/uL — ABNORMAL HIGH (ref 0.1–1.0)
Monocytes Relative: 7 %
Neutro Abs: 24.4 10*3/uL — ABNORMAL HIGH (ref 1.7–7.7)
Neutrophils Relative %: 84 %
Smear Review: NORMAL

## 2020-05-05 LAB — CBC
HCT: 27.4 % — ABNORMAL LOW (ref 39.0–52.0)
Hemoglobin: 8.6 g/dL — ABNORMAL LOW (ref 13.0–17.0)
MCH: 31.2 pg (ref 26.0–34.0)
MCHC: 31.4 g/dL (ref 30.0–36.0)
MCV: 99.3 fL (ref 80.0–100.0)
Platelets: 185 10*3/uL (ref 150–400)
RBC: 2.76 MIL/uL — ABNORMAL LOW (ref 4.22–5.81)
RDW: 16.3 % — ABNORMAL HIGH (ref 11.5–15.5)
WBC: 28.8 10*3/uL — ABNORMAL HIGH (ref 4.0–10.5)
nRBC: 0.1 % (ref 0.0–0.2)

## 2020-05-05 LAB — LACTIC ACID, PLASMA
Lactic Acid, Venous: 2.3 mmol/L (ref 0.5–1.9)
Lactic Acid, Venous: 2.3 mmol/L (ref 0.5–1.9)
Lactic Acid, Venous: 3.7 mmol/L (ref 0.5–1.9)
Lactic Acid, Venous: 4.7 mmol/L (ref 0.5–1.9)
Lactic Acid, Venous: 4.2 mmol/L (ref 0.5–1.9)

## 2020-05-05 LAB — BLOOD GAS, ARTERIAL
Acid-base deficit: 3.3 mmol/L — ABNORMAL HIGH (ref 0.0–2.0)
Bicarbonate: 19.4 mmol/L — ABNORMAL LOW (ref 20.0–28.0)
FIO2: 0.21
O2 Saturation: 94.4 %
Patient temperature: 37
pCO2 arterial: 26 mmHg — ABNORMAL LOW (ref 32.0–48.0)
pH, Arterial: 7.48 — ABNORMAL HIGH (ref 7.350–7.450)
pO2, Arterial: 67 mmHg — ABNORMAL LOW (ref 83.0–108.0)

## 2020-05-05 LAB — COMPREHENSIVE METABOLIC PANEL
ALT: 11 U/L (ref 0–44)
AST: 14 U/L — ABNORMAL LOW (ref 15–41)
Albumin: 2 g/dL — ABNORMAL LOW (ref 3.5–5.0)
Alkaline Phosphatase: 38 U/L (ref 38–126)
Anion gap: 7 (ref 5–15)
BUN: 17 mg/dL (ref 8–23)
CO2: 21 mmol/L — ABNORMAL LOW (ref 22–32)
Calcium: 8 mg/dL — ABNORMAL LOW (ref 8.9–10.3)
Chloride: 115 mmol/L — ABNORMAL HIGH (ref 98–111)
Creatinine, Ser: 0.87 mg/dL (ref 0.61–1.24)
GFR, Estimated: >60 mL/min (ref >60)
Glucose, Bld: 132 mg/dL — ABNORMAL HIGH (ref 70–99)
Potassium: 3.5 mmol/L (ref 3.5–5.1)
Sodium: 143 mmol/L (ref 135–145)
Total Bilirubin: 1 mg/dL (ref 0.3–1.2)
Total Protein: 4.4 g/dL — ABNORMAL LOW (ref 6.5–8.1)

## 2020-05-05 LAB — RENAL FUNCTION PANEL
Albumin: 2 g/dL — ABNORMAL LOW (ref 3.5–5.0)
Anion gap: 10 (ref 5–15)
BUN: 16 mg/dL (ref 8–23)
CO2: 22 mmol/L (ref 22–32)
Calcium: 7.8 mg/dL — ABNORMAL LOW (ref 8.9–10.3)
Chloride: 111 mmol/L (ref 98–111)
Creatinine, Ser: 0.88 mg/dL (ref 0.61–1.24)
GFR, Estimated: >60 mL/min (ref >60)
Glucose, Bld: 117 mg/dL — ABNORMAL HIGH (ref 70–99)
Phosphorus: 2.5 mg/dL (ref 2.5–4.6)
Potassium: 4 mmol/L (ref 3.5–5.1)
Sodium: 143 mmol/L (ref 135–145)

## 2020-05-05 LAB — URINALYSIS, ROUTINE W REFLEX MICROSCOPIC
Bilirubin Urine: NEGATIVE
Glucose, UA: NEGATIVE mg/dL
Ketones, ur: NEGATIVE mg/dL
Leukocytes,Ua: NEGATIVE
Nitrite: NEGATIVE
Protein, ur: 30 mg/dL — AB
RBC / HPF: >50 RBC/hpf — ABNORMAL HIGH (ref 0–5)
Specific Gravity, Urine: 1.035 — ABNORMAL HIGH (ref 1.005–1.030)
pH: 5 (ref 5.0–8.0)

## 2020-05-05 LAB — MAGNESIUM: Magnesium: 1.9 mg/dL (ref 1.7–2.4)

## 2020-05-05 LAB — GLUCOSE, CAPILLARY
Glucose-Capillary: 117 mg/dL — ABNORMAL HIGH (ref 70–99)
Glucose-Capillary: 131 mg/dL — ABNORMAL HIGH (ref 70–99)
Glucose-Capillary: 111 mg/dL — ABNORMAL HIGH (ref 70–99)

## 2020-05-05 LAB — TRIGLYCERIDES: Triglycerides: 147 mg/dL (ref <150)

## 2020-05-05 LAB — PHOSPHORUS: Phosphorus: 2.8 mg/dL (ref 2.5–4.6)

## 2020-05-05 LAB — TROPONIN I (HIGH SENSITIVITY)
Troponin I (High Sensitivity): 536 ng/L (ref <18)
Troponin I (High Sensitivity): 489 ng/L (ref <18)
Troponin I (High Sensitivity): 474 ng/L (ref <18)

## 2020-05-05 LAB — PROCALCITONIN
Procalcitonin: 0.51 ng/mL
Procalcitonin: 0.46 ng/mL

## 2020-05-05 LAB — HEMOGLOBIN A1C
Hgb A1c MFr Bld: 5.5 % (ref 4.8–5.6)
Mean Plasma Glucose: 111.15 mg/dL

## 2020-05-05 LAB — PREALBUMIN: Prealbumin: 8.7 mg/dL — ABNORMAL LOW (ref 18–38)

## 2020-05-05 MED ORDER — METOPROLOL TARTRATE 5 MG/5ML IV SOLN
2.5000 mg | INTRAVENOUS | Status: DC | PRN
  Administered 2020-05-05: 2.5 mg via INTRAVENOUS
  Administered 2020-05-06: 5 mg via INTRAVENOUS
  Filled 2020-05-05 (×2): qty 5

## 2020-05-05 MED ORDER — AMIODARONE HCL IN DEXTROSE 360-4.14 MG/200ML-% IV SOLN
60.0000 mg/h | INTRAVENOUS | Status: AC
  Administered 2020-05-05: 60 mg/h via INTRAVENOUS
  Filled 2020-05-05 (×2): qty 200

## 2020-05-05 MED ORDER — METOPROLOL TARTRATE 5 MG/5ML IV SOLN
5.0000 mg | Freq: Four times a day (QID) | INTRAVENOUS | Status: DC | PRN
  Administered 2020-05-05: 5 mg via INTRAVENOUS
  Filled 2020-05-05: qty 5

## 2020-05-05 MED ORDER — LACTATED RINGERS IV BOLUS
500.0000 mL | Freq: Once | INTRAVENOUS | Status: AC
  Administered 2020-05-05: 500 mL via INTRAVENOUS

## 2020-05-05 MED ORDER — LACTATED RINGERS IV SOLN: INTRAVENOUS | Status: AC

## 2020-05-05 MED ORDER — SODIUM CHLORIDE 0.9 % IV SOLN
80.0000 mg | Freq: Two times a day (BID) | INTRAVENOUS | Status: DC
  Administered 2020-05-05 – 2020-05-08 (×7): 80 mg via INTRAVENOUS
  Filled 2020-05-05 (×12): qty 80

## 2020-05-05 MED ORDER — AMIODARONE LOAD VIA INFUSION
150.0000 mg | Freq: Once | INTRAVENOUS | Status: AC
  Administered 2020-05-05: 150 mg via INTRAVENOUS
  Filled 2020-05-05: qty 83.34

## 2020-05-05 MED ORDER — TRAVASOL 10 % IV SOLN
INTRAVENOUS | Status: AC
  Filled 2020-05-05: qty 792

## 2020-05-05 MED ORDER — IPRATROPIUM-ALBUTEROL 0.5-2.5 (3) MG/3ML IN SOLN
3.0000 mL | Freq: Four times a day (QID) | RESPIRATORY_TRACT | Status: DC
  Administered 2020-05-05 – 2020-05-09 (×16): 3 mL via RESPIRATORY_TRACT
  Filled 2020-05-05 (×13): qty 3

## 2020-05-05 MED ORDER — AMIODARONE HCL IN DEXTROSE 360-4.14 MG/200ML-% IV SOLN
30.0000 mg/h | INTRAVENOUS | Status: DC
  Administered 2020-05-05 – 2020-05-07 (×4): 30 mg/h via INTRAVENOUS
  Filled 2020-05-05 (×4): qty 200

## 2020-05-05 MED ORDER — SODIUM CHLORIDE 0.9 % IV BOLUS
1000.0000 mL | Freq: Once | INTRAVENOUS | Status: AC
  Administered 2020-05-05: 1000 mL via INTRAVENOUS

## 2020-05-05 NOTE — Progress Notes (Signed)
05/05/2020  Subjective: Patient is 2 Days Post-Op s/p exlap and small bowel resection for proximal jejunal perforation.  Overnight, had episodes of SVT which then turned to atrial fibrillation.  This converted back to sinus tachycardia after one dose of IV metoprolol.  Along with his, patient was complaining of shortness of breath.  CXR showed trace left pleural effusion and CTA chest was obtained to eval for PE.  Negative for PE but showed LLL pneumonia.  His lactic acid was elevated to 6.8 and received IV fluid boluses, with good response of lactic acid down to 2.3.  However, it is more elevated again this morning to 3.7.  His troponin also was elevated to 536 and has slowly downtrended to 474 this morning.  His BP has remained stable and sometimes elevated overnight.  His creatinine is normal today at 0.87, though his urine output has been somewhat low overnight.  This morning, he remains with sinus tachycardia, somewhat tachypneic, with abdominal pain.  Vital signs: Temp:  [99.1 F (37.3 C)-99.7 F (37.6 C)] 99.1 F (37.3 C) (01/01 0200) Pulse Rate:  [93-118] 112 (01/01 0743) Resp:  [22-36] 31 (01/01 0743) BP: (87-125)/(49-87) 112/68 (01/01 0700) SpO2:  [91 %-99 %] 93 % (01/01 0743) Arterial Line BP: (125-181)/(54-115) 157/68 (12/31 2100) FiO2 (%):  [32.6 %] 32.6 % (01/01 0011) Weight:  [74.9 kg] 74.9 kg (12/31 1225)   Intake/Output: 12/31 0701 - 01/01 0700 In: 1199.4 [I.V.:1111.5; IV Piggyback:87.9] Out: 1620 [Urine:700; Emesis/NG output:550; Drains:370] Last BM Date:  (PTA)  Physical Exam: Constitutional: No acute distress Cardiac:  Sinus tachycardia Pulm:  Upper respiratory congestion, with shallow breathing Abdomen:  Soft, non-distended, with diffuse tenderness to palpation.  Midline incision with staples, and some serous drainage.  JP drain in LUQ with frank bilious fluid drainage -- high output. GU:  Foley catheter in place.  Labs:  Recent Labs    05/04/20 2237  05/05/20 0225  WBC 32.2* 28.8*  HGB 9.4* 8.6*  HCT 30.0* 27.4*  PLT 203 185   Recent Labs    05/04/20 2237 05/05/20 0225  NA 142 143  K 4.0 3.5  CL 112* 115*  CO2 15* 21*  GLUCOSE 156* 132*  BUN 17 17  CREATININE 1.06 0.87  CALCIUM 8.2* 8.0*   No results for input(s): LABPROT, INR in the last 72 hours.  Imaging: CT ANGIO CHEST PE W OR WO CONTRAST  Result Date: 05/05/2020 CLINICAL DATA:  Pulmonary embolism, status post small bowel resection pod # 1 EXAM: CT ANGIOGRAPHY CHEST WITH CONTRAST TECHNIQUE: Multidetector CT imaging of the chest was performed using the standard protocol during bolus administration of intravenous contrast. Multiplanar CT image reconstructions and MIPs were obtained to evaluate the vascular anatomy. CONTRAST:  69m OMNIPAQUE IOHEXOL 350 MG/ML SOLN COMPARISON:  10/14/2017, 04/28/2020 FINDINGS: Cardiovascular: Satisfactory opacification of the pulmonary arteries to the segmental level. No evidence of pulmonary embolism. Normal heart size. No pericardial effusion. The thoracic aorta is unremarkable. Right internal jugular central venous catheter tip is seen within the proximal superior vena cava. Mediastinum/Nodes: Visualized thyroid is unremarkable. No pathologic thoracic adenopathy. Nasogastric tube extends into the proximal body of the stomach. The esophagus is otherwise unremarkable. Lungs/Pleura: There is dense consolidation of the left lower lobe in keeping with changes of acute lobar pneumonia. There is extensive endobronchial debris within the a left lower lobe segmental bronchi. Superimposed mild to moderate centrilobular emphysema. No pneumothorax. Trace left pleural effusion. Upper Abdomen: At least mild hepatic steatosis noted. Surgical drain is partially  visualized within the epigastric region. Punctate free intraperitoneal gas is likely postsurgical in nature. Musculoskeletal: No acute bone abnormality. Review of the MIP images confirms the above findings.  IMPRESSION: No pulmonary embolism. Dense consolidation of the left lower lobe with extensive endobronchial debris in keeping with aspiration or infection. Moderate centrilobular emphysema. Emphysema (ICD10-J43.9). Electronically Signed   By: Fidela Salisbury MD   On: 05/05/2020 00:13   DG CHEST PORT 1 VIEW  Result Date: 05/05/2020 CLINICAL DATA:  Enteric tube placement EXAM: PORTABLE CHEST 1 VIEW COMPARISON:  Chest radiograph from one day prior. FINDINGS: Right internal jugular central venous catheter terminates in the middle third of the SVC. Enteric tube terminates in the proximal stomach. Stable cardiomediastinal silhouette with normal heart size. No pneumothorax. Stable small left pleural effusion. No right pleural effusion. No pulmonary edema. Increased patchy left retrocardiac consolidation. IMPRESSION: 1. Enteric tube terminates in the proximal stomach. 2. Stable small left pleural effusion. 3. Increased patchy left retrocardiac consolidation, either atelectasis, aspiration or pneumonia. Electronically Signed   By: Ilona Sorrel M.D.   On: 05/05/2020 06:07   DG CHEST PORT 1 VIEW  Result Date: 05/04/2020 CLINICAL DATA:  Tachycardia and shortness of breath EXAM: PORTABLE CHEST 1 VIEW COMPARISON:  04/27/2018 FINDINGS: A right central venous catheter has been placed with tip over the mid SVC region. No pneumothorax. An enteric tube is present with tip projected at or just below the expected location of the EG junction. Proximal side hole is projected in the lower esophagus region. Advancement is suggested. Mild cardiac enlargement. Small left pleural effusion with infiltration or atelectasis in the left lung base. Surgical clips at the EG junction region. IMPRESSION: Enteric tube tip is at or just below the expected location of the EG junction. Advancement is suggested. Small left pleural effusion with infiltration or atelectasis in the left lung base. Electronically Signed   By: Lucienne Capers M.D.   On:  05/04/2020 22:29    Assessment/Plan: This is a 65 y.o. male s/p exlap and SBR for perforated proximal jejunum.  --Given the patient's afib episode last night and tachycardia with elevated troponin, have consulted cardiology for evaluation.  Dr. Lonny Prude with hospitalist team is also involved and has ordered echo for this morning.  Has been off pressors since yesterday. --Patient with shallow breathing, but not requiring supplemental oxygen at this time.  Has LLL pneumonia on CTA chest.  Continue IV Zosyn.  Encourage frequent coughing and I/S use. --Keep NPO with TPN for nutrition and octreotide to help control the leak output.  Given his lactic acidosis, will also bolus this morning with 500 ml LR. --With his abdominal pain and septic type of picture, discussed with Dr. Lysle Pearl this morning and will order CT abdomen/pelvis w/o contrast to evaluate for an uncontrolled leak / large abdominal fluid.  May require IR drainage if any significant fluid is found. --DVT and GI prophylaxis   Melvyn Neth, [place]

## 2020-05-05 NOTE — Progress Notes (Signed)
Spoke with Dr Celine Ahr from surgery. Patients heart rate 130's, 15 beat run of SVT. Orders to give Cardizem 10 mg IV once. Cardiology consulted.

## 2020-05-05 NOTE — Progress Notes (Addendum)
Cross Cover Approximately 2200 was called by bedside nursing patient with tachyarrhythmia and shortness of breath  A fib with RVR 160 on monitor, nurse noted short runs of SVT earlier Responded well to dose of metoprolol,  Patient felt breathing also slightly improved although he was still breathing in the 30,s short shallow respirations.  BBS very faint occasional inspiratory wheeze, slight crackles left lower lobe Noticeable improvement in heart rate and respirations after 5 mg IV metoprolol. CXR with LLL pneumonia CT ordered rule out PE See hospiralist consult note per Dr. Marcello Moores

## 2020-05-05 NOTE — Consult Note (Signed)
PHARMACY - TOTAL PARENTERAL NUTRITION CONSULT NOTE   Indication: Fistula  Patient Measurements: Height: 5' 10"  (177.8 cm) Weight: 74.9 kg (165 lb 2 oz) IBW/kg (Calculated) : 73 TPN AdjBW (KG): 81.1 Body mass index is 23.69 kg/m.  Assessment:  Patient is a 65 y/o M with medical history including Crohn's disease, history of PE, GERD who presented to the ED 12/30 with severe abdominal pain. Patient went for emergent exploratory laparotomy with small bowel resection and lysis of adhesions. 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   Glucose / Insulin: No apparent history of diabetes. BG 111 - 156 last 24h.  Electrolytes: Hyperchloremia, K=3.5 with episode of a fib with RVR 1/1 am - will replete and follow Renal: Scr 0.53 >> 0.73>0.87 since admission LFTs / TGs: Transaminases within normal limits. TG ordered for tomorrow AM. Prealbumin / albumin: Albumin 2.7 on 12/30 Intake / Output; MIVF: Net + 2.3L this admission.  GI Imaging: 12/30 CT Abdomen Pelvis:   Suspicious for a closed loop proximal jejunal obstruction due to an internal hernia.  Suspicious for chronic and acute diverticulitis involving the proximal to mid sigmoid colon with associated interval irregular adjacent soft tissue mass measuring 5.4 x 4.6 x 3.4 cm.  Small to moderate amount of free peritoneal fluid. Surgeries / Procedures:  12/30 Exploratory laparotomy with small bowel resection and lysis of adhesions  Central access: 12/30 (CVC Double Lumen 04/10/2020 Right Internal jugular) TPN start date: 12/31   Nutritional Goals (per RD recommendation on 12/31): kCal: 2100 - 2300, Protein: 110-120g, Fluid: 2.2 - 2.6L Goal TPN rate is 100 mL/hr (provides 120 g of protein and 2227 kcals per day)  Current Nutrition:  NPO  Plan:  --Increase TPN to 2/3 goal rate at 66 mL/hr at 1800  Fluid: 1584 mL  Protein: 79.2 g  Dextrose: 269.2 g  Lipids: 23.8 g  Kcal: 1470  --Electrolytes in TPN: 631mq/L of  Na, 560m/L of K, 31m28mL of Ca, 31mE431m of Mg, and 131mm24m of Phos. Change Cl:Ac ratio to 1:2 - Replete K with [id number] 4 --Add standard MVI and trace elements to TPN, add thiamine for re-feed risk --Initiate Sensitive q8h SSI and adjust as needed  --Reduce MIVF to 40 mL/hr at 1800  Adjust MIVF based on TPN rate for goal 100 mL/hr  Discussed with surgeon, will change MIVF from NS to 1/2 NS given hyperchloremia (now LR) - will reduce rate with increase in TPN rate --Monitor TPN labs on Mon/Thurs, daily until stable --Given lipid emulsion shortage, will reduce lipid content in TPN to target 15% of total daily kCals from [employer] instead of 30% to align with guidance put forth. AA and dextrose content increased to meet calorie requirements.   CharleLu DuffelmD, BCPS Clinical Pharmacist 05/05/2020 8:44 AM

## 2020-05-05 NOTE — Consult Note (Signed)
Cardiology Consultation:   Patient ID: [name] MRN: [id number]; DOB: 1955/12/12  Admit date: 04/05/2020 Date of Consult: 05/05/2020  Primary Care Provider: Lynnell Jude, MD [facility] Cardiologist: New to [facility] Physician requesting consult: [facility] Reason for consult: Atrial fibrillation with RVR   Patient Profile:   Brent Diaz is a 65 y.o. male with a hx of Crohn's disease, history of PE on Eliquis , prior bowel surgeries, presenting to the hospital with severe abdominal pain, closed-loop proximal jejunal obstruction/internal hernia, chronic and acute diverticulitis, underwent exploratory lap, small bowel resection for proximal jejunal perforation, postop day 2, last night with narrow complex tachycardia concerning for atrial fibrillation, runs of SVT, nonsustained VT  History of Present Illness:   Brent Diaz was taken to the operating room May 03, 2020, exploratory laparotomy, small bowel resection, lysis of adhesions,  December 31 with abdominal discomfort, off pressors, Noted to have bile leak, Given location perforation, prior abdominal surgeries, baseline Crohn's causing chronic unhealthy bowel tissue, attempts made to avoid more surgery Strict n.p.o., TPN, NG decompression, JP drain to control leak, octreotide to slow output  Eliquis on hold Hemoglobin being closely monitored, on IV Protonix  Atrial fibrillation yesterday evening, converting back to normal sinus rhythm with IV metoprolol Review of telemetry this morning with short runs of atrial fibrillation, also 20 beat run wide-complex tachycardia, unable to exclude SVT with aberrancy versus nonsustained VT   lactic acid  3.7.    troponin  536    Past Medical History:  Diagnosis Date  . Cancer ([place])    skin  . Crohn's disease ([place])   . GERD (gastroesophageal reflux disease)   . Hypertension   . MRSA (methicillin resistant staph aureus) culture positive    cultured during sinus surgery  05/2017  . Neutrophilia   . Pulmonary embolism ([place]) 10/2017  . Shingles    resolved  . Wears contact lenses    Left eye    Past Surgical History:  Procedure Laterality Date  . ABDOMINAL SURGERY    . BOWEL RESECTION     x 3  . CATARACT EXTRACTION W/PHACO Left 10/18/2018   Procedure: CATARACT EXTRACTION PHACO AND INTRAOCULAR LENS PLACEMENT ([place]) LEFT;  Surgeon: Eulogio Bear, MD;  Location: [place];  Service: Ophthalmology;  Laterality: Left;  . CATARACT EXTRACTION W/PHACO Right 12/06/2018   Procedure: CATARACT EXTRACTION PHACO AND INTRAOCULAR LENS PLACEMENT ([facility]) RIGHT;  Surgeon: Eulogio Bear, MD;  Location: [place];  Service: Ophthalmology;  Laterality: Right;  . HYDROCELE EXCISION / REPAIR    . IMAGE GUIDED SINUS SURGERY N/A 05/21/2017   Procedure: IMAGE GUIDED SINUS SURGERY;  Surgeon: Margaretha Sheffield, MD;  Location: [place];  Service: ENT;  Laterality: N/A;  need stryker disk  . LAPAROTOMY N/A 04/19/2020   Procedure: EXPLORATORY LAPAROTOMY;  Surgeon: Benjamine Sprague, DO;  Location: [facility];  Service: General;  Laterality: N/A;  . MAXILLARY ANTROSTOMY Right 05/21/2017   Procedure: MAXILLARY ANTROSTOMY;  Surgeon: Margaretha Sheffield, MD;  Location: [place];  Service: ENT;  Laterality: Right;  . SEPTOPLASTY N/A 05/21/2017   Procedure: SEPTOPLASTY;  Surgeon: Margaretha Sheffield, MD;  Location: [place];  Service: ENT;  Laterality: N/A;     Home Medications:  Prior to Admission medications   Medication Sig Start Date End Date Taking? Authorizing Provider  apixaban (ELIQUIS) 5 MG TABS tablet Take 1 tablet (5 mg total) by mouth 2 (two) times daily. Start 6/19 10/21/17   Mody,  Sital, MD  B Complex-Folic Acid (B COMPLEX-VITAMIN B12 PO) Take 1,000 mcg by mouth daily.     [provider]  budesonide (ENTOCORT EC) 3 MG 24 hr capsule Take 6 mg by mouth daily.     [provider]  butalbital-acetaminophen-caffeine (FIORICET)  50-325-40 MG tablet Take 1 tablet by mouth every 4 (four) hours as needed for headache.    [provider]  fluorouracil (EFUDEX) 5 % cream Apply topically. 09/19/19 09/18/20  [provider]  folic acid (FOLVITE) 1 MG tablet Take 1 mg by mouth daily. 06/21/16   [provider]  hyoscyamine (LEVSIN SL) 0.125 MG SL tablet Place 0.125 mg under the tongue every 4 (four) hours as needed.    [provider]  LISINOPRIL-HYDROCHLOROTHIAZIDE PO Take 1 tablet by mouth daily. 5 mg daily    [provider]  loperamide (IMODIUM) 2 MG capsule Take 2 mg by mouth as needed for diarrhea or loose stools.    [provider]  Melatonin 5 MG TABS Take 10 mg by mouth at bedtime as needed.    [provider]  montelukast (SINGULAIR) 10 MG tablet Take 10 mg by mouth every morning.     [provider]  nystatin (MYCOSTATIN) 100000 UNIT/ML suspension Swish and swallow 5 mLs 4 (four) times daily 12/22/19   [provider]  ondansetron (ZOFRAN ODT) 4 MG disintegrating tablet Take 1 tablet (4 mg total) by mouth every 8 (eight) hours as needed for nausea or vomiting. 04/27/18   Darel Hong, MD  pantoprazole (PROTONIX) 40 MG tablet Take 40 mg by mouth 2 (two) times daily. 04/14/16   [provider]  potassium chloride SA (K-DUR,KLOR-CON) 20 MEQ tablet Take 1 tablet by mouth daily.  10/08/17 04/11/20  [provider]  vitamin B-12 (CYANOCOBALAMIN) 1000 MCG tablet Take 1,000 mcg by mouth daily.    [provider]  Vitamin D, Ergocalciferol, (DRISDOL) 50000 units CAPS capsule Take 50,000 Units by mouth every 7 (seven) days.    [provider]  zolpidem (AMBIEN) 10 MG tablet Take 10 mg by mouth at bedtime as needed for sleep. Patient not taking: Reported on 03/27/2020    [provider]    Inpatient Medications: Scheduled Meds: . Chlorhexidine Gluconate Cloth  6 each Topical Daily  . insulin aspart  0-9  Units Subcutaneous Q8H  . ipratropium-albuterol  3 mL Nebulization Q6H  . mouth rinse  15 mL Mouth Rinse q12n4p  . octreotide  50 mcg Subcutaneous Q8H   Continuous Infusions: . amiodarone 60 mg/hr (05/05/20 1134)   Followed by  . amiodarone    . lactated ringers 75 mL/hr at 05/05/20 0105  . lactated ringers    . pantoprazole (PROTONIX) IV    . piperacillin-tazobactam (ZOSYN)  IV 3.375 g (05/05/20 0111)  . TPN ADULT (ION) Stopped (05/04/20 2156)  . TPN ADULT (ION)     PRN Meds: acetaminophen **OR** acetaminophen, ketorolac, metoprolol tartrate, morphine injection, ondansetron **OR** ondansetron (ZOFRAN) IV, promethazine  Allergies:    Allergies  Allergen Reactions  . Amlodipine Swelling    feet  . Aspirin Other (See Comments)    Peptic ulcers  . Flagyl [Metronidazole]     Gives neuropathy if taken too long     Social History:   Social History   Socioeconomic History  . Marital status: Married    Spouse name: Not on file  . Number of children: Not on file  . Years of education: Not on file  .  Highest education level: Not on file  Occupational History  . Not on file  Tobacco Use  . Smoking status: Current Every Day Smoker    Packs/day: 0.50    Years: 47.00    Pack years: 23.50    Types: Cigarettes  . Smokeless tobacco: Never Used  . Tobacco comment: since age 38  Vaping Use  . Vaping Use: Never used  Substance and Sexual Activity  . Alcohol use: No  . Drug use: Never  . Sexual activity: Not on file  Other Topics Concern  . Not on file  Social History Narrative  . Not on file   Social Determinants of Health   Financial Resource Strain: Not on file  Food Insecurity: Not on file  Transportation Needs: Not on file  Physical Activity: Not on file  Stress: Not on file  Social Connections: Not on file  Intimate Partner Violence: Not on file    Family History:    Family History  Problem Relation Age of Onset  . Aneurysm Father 50  . Dementia Mother 32      ROS:  Please see the history of present illness.  Review of Systems  Constitutional: Negative.   HENT: Negative.   Respiratory: Negative.   Cardiovascular: Negative.   Gastrointestinal: Negative.   Musculoskeletal: Negative.   Neurological: Negative.   Psychiatric/Behavioral: Negative.   All other systems reviewed and are negative.    Physical Exam/Data:   Vitals:   05/05/20 1000 05/05/20 1030 05/05/20 1100 05/05/20 1200  BP: 135/70  123/64 (!) 98/54  Pulse: (!) 125  (!) 137 (!) 126  Resp: (!) 38  (!) 41 (!) 33  Temp:  (!) 101.4 F (38.6 C)  98.3 F (36.8 C)  TempSrc:      SpO2: 95%  95% 94%  Weight:      Height:        Intake/Output Summary (Last 24 hours) at 05/05/2020 1242 Last data filed at 05/05/2020 1100 Gross per 24 hour  Intake 1199.37 ml  Output 1470 ml  Net -270.63 ml   Last 3 Weights 05/04/2020 04/13/2020 04/23/2020  Weight (lbs) 165 lb 2 oz 178 lb 12.7 oz 165 lb  Weight (kg) 74.9 kg 81.1 kg 74.844 kg     Body mass index is 23.69 kg/m.  General:  Well nourished, well developed, in no acute distress HEENT: normal Lymph: no adenopathy Neck: no JVD Endocrine:  No thryomegaly Vascular: No carotid bruits; FA pulses 2+ bilaterally without bruits  Cardiac:  normal S1, S2; RRR; no murmur  Lungs:  clear to auscultation bilaterally, no wheezing, rhonchi or rales  Abd: soft, nontender, no hepatomegaly  Ext: no edema Musculoskeletal:  No deformities, BUE and BLE strength normal and equal Skin: warm and dry  Neuro:  CNs 2-12 intact, no focal abnormalities noted Psych:  Normal affect   EKG:  The EKG was personally reviewed and demonstrates:   Normal sinus rhythm rate 99 bpm nonspecific ST abnormality, old inferior MI  Telemetry:  Telemetry was personally reviewed and demonstrates:   Normal sinus rhythm, short runs atrial fibrillation, 1 run wide-complex tachycardia, unable to exclude nonsustained VT   Relevant CV Studies:  Echocardiogram  pending  Laboratory Data:  High Sensitivity Troponin:   Recent Labs  Lab 05/05/20 0225 05/05/20 0517 05/05/20 0629  TROPONINIHS 536* 489* 474*     Chemistry Recent Labs  Lab 05/04/20 2237 05/05/20 0225 05/05/20 0629  NA 142 143 143  K 4.0 3.5 4.0  CL  112* 115* 111  CO2 15* 21* 22  GLUCOSE 156* 132* 117*  BUN 17 17 16   CREATININE 1.06 0.87 0.88  CALCIUM 8.2* 8.0* 7.8*  GFRNONAA >60 >60 >60  ANIONGAP 15 7 10     Recent Labs  Lab 04/22/2020 0426 05/05/20 0225 05/05/20 0629  PROT 5.8* 4.4*  --   ALBUMIN 2.7* 2.0* 2.0*  AST 16 14*  --   ALT 15 11  --   ALKPHOS 56 38  --   BILITOT 0.5 1.0  --    Hematology Recent Labs  Lab 05/04/20 0630 05/04/20 2237 05/05/20 0225  WBC 36.1* 32.2* 28.8*  RBC 3.54* 3.01* 2.76*  HGB 11.2* 9.4* 8.6*  HCT 34.1* 30.0* 27.4*  MCV 96.3 99.7 99.3  MCH 31.6 31.2 31.2  MCHC 32.8 31.3 31.4  RDW 16.0* 16.3* 16.3*  PLT 221 203 185   BNP Recent Labs  Lab 05/04/20 2207  BNP 193.7*    DDimer No results for input(s): DDIMER in the last 168 hours.   Radiology/Studies:  CT ABDOMEN PELVIS WO CONTRAST  Result Date: 05/05/2020 CLINICAL DATA:  Abdominal pain, fever.  Postop jejunal perforation EXAM: CT ABDOMEN AND PELVIS WITHOUT CONTRAST TECHNIQUE: Multidetector CT imaging of the abdomen and pelvis was performed following the standard protocol without IV contrast. COMPARISON:  04/19/2020 FINDINGS: Lower chest: Consolidation posteriorly in the left lower lobe. Trace left pleural effusion. Heart is normal size. Right base atelectasis. Hepatobiliary: Layering high-density material within the gallbladder may reflect vicarious excretion of bowel. No focal hepatic abnormality. Pancreas: No focal abnormality or ductal dilatation. Spleen: Calcifications throughout the spleen.  Normal size. Adrenals/Urinary Tract: Small hyperdense lesion off the upper pole of the right kidney measures 2 cm, likely hemorrhagic cyst, stable. Small hyperdense lesion off the  upper pole of the left kidney measures 1.2 cm, stable. IV contrast noted within the collecting systems from prior contrast CT. Adrenal glands and urinary bladder unremarkable. No hydronephrosis. Stomach/Bowel: Postoperative changes in the jejunum. No current evidence of bowel obstruction. Sigmoid diverticulosis. Continued inflammation around the sigmoid colon with soft tissue thickening and stranding around the sigmoid colon along with locules of extraluminal gas concerning for diverticulitis and perforation. Findings are similar to prior study. NG tube in the stomach. Vascular/Lymphatic: Aortic atherosclerosis. No evidence of aneurysm or adenopathy. Reproductive: No visible focal abnormality. Other: Small amount of free air in the upper abdomen, likely postoperative. Surgical drain noted in the left abdomen. Musculoskeletal: No acute bony abnormality. IMPRESSION: Postoperative changes in the region of the jejunum. Continued inflammation and extraluminal locules of gas adjacent to the sigmoid colon concerning for diverticulitis and contained perforation. This is unchanged since prior study. No bowel obstruction. Electronically Signed   By: Rolm Baptise M.D.   On: 05/05/2020 10:09   CT ANGIO CHEST PE W OR WO CONTRAST  Result Date: 05/05/2020 CLINICAL DATA:  Pulmonary embolism, status post small bowel resection pod # 1 EXAM: CT ANGIOGRAPHY CHEST WITH CONTRAST TECHNIQUE: Multidetector CT imaging of the chest was performed using the standard protocol during bolus administration of intravenous contrast. Multiplanar CT image reconstructions and MIPs were obtained to evaluate the vascular anatomy. CONTRAST:  43m OMNIPAQUE IOHEXOL 350 MG/ML SOLN COMPARISON:  10/14/2017, 04/15/2020 FINDINGS: Cardiovascular: Satisfactory opacification of the pulmonary arteries to the segmental level. No evidence of pulmonary embolism. Normal heart size. No pericardial effusion. The thoracic aorta is unremarkable. Right internal jugular  central venous catheter tip is seen within the proximal superior vena cava. Mediastinum/Nodes: Visualized thyroid is unremarkable.  No pathologic thoracic adenopathy. Nasogastric tube extends into the proximal body of the stomach. The esophagus is otherwise unremarkable. Lungs/Pleura: There is dense consolidation of the left lower lobe in keeping with changes of acute lobar pneumonia. There is extensive endobronchial debris within the a left lower lobe segmental bronchi. Superimposed mild to moderate centrilobular emphysema. No pneumothorax. Trace left pleural effusion. Upper Abdomen: At least mild hepatic steatosis noted. Surgical drain is partially visualized within the epigastric region. Punctate free intraperitoneal gas is likely postsurgical in nature. Musculoskeletal: No acute bone abnormality. Review of the MIP images confirms the above findings. IMPRESSION: No pulmonary embolism. Dense consolidation of the left lower lobe with extensive endobronchial debris in keeping with aspiration or infection. Moderate centrilobular emphysema. Emphysema (ICD10-J43.9). Electronically Signed   By: Fidela Salisbury MD   On: 05/05/2020 00:13   CT Abdomen Pelvis W Contrast  Result Date: 05/02/2020 CLINICAL DATA:  Severe abdominal pain for the past 3 hours. Two bowel movements this morning. Previous bowel resection. Crohn's disease. EXAM: CT ABDOMEN AND PELVIS WITH CONTRAST TECHNIQUE: Multidetector CT imaging of the abdomen and pelvis was performed using the standard protocol following bolus administration of intravenous contrast. CONTRAST:  161m OMNIPAQUE IOHEXOL 300 MG/ML  SOLN COMPARISON:  10/14/2017 FINDINGS: Lower chest: Unremarkable. Hepatobiliary: Mild diffuse low density of the liver without significant change. Normal appearing gallbladder. Pancreas: Unremarkable. No pancreatic ductal dilatation or surrounding inflammatory changes. Spleen: Multiple calcified granulomata. Adrenals/Urinary Tract: Normal appearing  adrenal glands. Multiple bilateral renal cysts. 1.2 cm exophytic mass arising from the upper pole of the left kidney measuring 54 Hounsfield units in density on the initial images and on the delayed images. No urinary tract calculi or hydronephrosis. Stomach/Bowel: Interval mass-like dilatation of a segment of proximal jejunum containing a mass-like conglomeration of contents and fluid without wall thickening. There is adjacent mesenteric fluid and there is some pinching and swirling of the mesentery medial to this focally dilated loop with an appearance suggesting an internal hernia. There is mild wall thickening involving the jejunum exiting this area focal dilatation with adjacent mesenteric fluid. Multiple colonic diverticula with wall thickening and pericolonic stranding and soft tissue density involving the proximal to mid sigmoid colon. The soft tissue density area adjacent to the sigmoid colon is irregular and contains several tiny locules of gas or air. This area measures 5.4 x 4.6 x 3.4 cm.  This was not previously present. Status post partial right inferior colectomy with staple lines. Vascular/Lymphatic: Atheromatous arterial calcifications without aneurysm. No enlarged lymph nodes. Reproductive: Prostate is unremarkable. Other: Small to moderate amount of free peritoneal fluid, including mesenteric fluid and pericolonic fluid. Musculoskeletal: Lumbar and lower thoracic spine degenerative changes. IMPRESSION: 1. 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 2. Mild wall thickening involving the jejunum distal to the focally dilated loop of jejunum in the left mid abdomen. This could be due ischemic changes associated with the internal hernia or active Crohn's disease. 3. Findings suspicious for chronic and acute diverticulitis involving the proximal to mid sigmoid colon with associated interval irregular adjacent soft tissue mass measuring 5.4 x 4.6 x 3.4  cm. The mass could represent an area of chronic inflammation and scarring or malignancy. 4. Small to moderate amount of free peritoneal fluid. 5. 1.2 cm medium density exophytic mass arising from the upper pole of the left kidney. This most likely represents a complicated cyst. Malignancy is less likely but not excluded. 6. Mild diffuse hepatic steatosis. Critical Value/emergent  results were called by telephone at the time of interpretation on 05/02/2020 at 8:27 am to Dr. Cherylann Banas, who verbally acknowledged these results. Electronically Signed   By: Claudie Revering M.D.   On: 05/01/2020 08:25   DG CHEST PORT 1 VIEW  Result Date: 05/05/2020 CLINICAL DATA:  Enteric tube placement EXAM: PORTABLE CHEST 1 VIEW COMPARISON:  Chest radiograph from one day prior. FINDINGS: Right internal jugular central venous catheter terminates in the middle third of the SVC. Enteric tube terminates in the proximal stomach. Stable cardiomediastinal silhouette with normal heart size. No pneumothorax. Stable small left pleural effusion. No right pleural effusion. No pulmonary edema. Increased patchy left retrocardiac consolidation. IMPRESSION: 1. Enteric tube terminates in the proximal stomach. 2. Stable small left pleural effusion. 3. Increased patchy left retrocardiac consolidation, either atelectasis, aspiration or pneumonia. Electronically Signed   By: Ilona Sorrel M.D.   On: 05/05/2020 06:07   DG CHEST PORT 1 VIEW  Result Date: 05/04/2020 CLINICAL DATA:  Tachycardia and shortness of breath EXAM: PORTABLE CHEST 1 VIEW COMPARISON:  04/27/2018 FINDINGS: A right central venous catheter has been placed with tip over the mid SVC region. No pneumothorax. An enteric tube is present with tip projected at or just below the expected location of the EG junction. Proximal side hole is projected in the lower esophagus region. Advancement is suggested. Mild cardiac enlargement. Small left pleural effusion with infiltration or atelectasis in the  left lung base. Surgical clips at the EG junction region. IMPRESSION: Enteric tube tip is at or just below the expected location of the EG junction. Advancement is suggested. Small left pleural effusion with infiltration or atelectasis in the left lung base. Electronically Signed   By: Lucienne Capers M.D.   On: 05/04/2020 22:29   [place] EKG SITE RITE  Result Date: 05/04/2020 If Site Rite image not attached, placement could not be confirmed due to current cardiac rhythm.    Assessment and Plan:   1.  Atrial fibrillation with RVR, paroxysmal Currently maintaining normal sinus rhythm High risk of recurrent arrhythmia given recent GI surgery, associated stressors, fever 101 -Unable to tolerate oral medications -Has received metoprolol IV, In an effort to avoid additional arrhythmia, would start amiodarone bolus with infusion Discussed with nursing, they will consolidate other IVs to gain access through central line -Additional doses of metoprolol IV can be used in addition to the amiodarone for breakthrough tachyarrhythmias -For persistent atrial fibrillation, repeat amiodarone IV bolus could be given -Echo pending  Left lower lobe pneumonia On CT scan, on broad-spectrum antibiotics On Zosyn  Perforated proximal jejunum Status post laparoscopic surgery, resection, lysis of adhesions -Postop day 2  Case discussed with nursing Medications ordered  Total encounter time more than 110 minutes  Greater than 50% was spent in counseling and coordination of care with the patient   For questions or updates, please contact [facility] Please consult [email or web address] for contact info under    Signed, Ida Rogue, MD  05/05/2020 12:42 PM

## 2020-05-05 NOTE — Progress Notes (Signed)
*  PRELIMINARY RESULTS* Echocardiogram 2D Echocardiogram has been performed.  Brent Diaz Kaira [place] 05/05/2020, 3:30 PM

## 2020-05-05 NOTE — Progress Notes (Signed)
Paged Dr. Rockey Situ. Patients heart rate mid 120's with amiodarone drip. Awaiting for call back.

## 2020-05-05 NOTE — Progress Notes (Signed)
Secure chat with Dr Lysle Pearl re PICC order. Blood cx pending with elevated WBC's and Lactic acid.  Recommendations made to cancel PICC order until blood cx negative x 48 hours, then reorder if still needed.  Pt currently has IJ CVC and PIV for current meds.  New order to d/c PICC.

## 2020-05-05 NOTE — Progress Notes (Signed)
PROGRESS NOTE    Brent Diaz  WUJ:[id number] DOB: 08-Oct-1955 DOA: 04/08/2020 PCP: Lynnell Jude, MD   Primary team: General surgery  Brief Narrative: Brent Diaz is a 65 y.o. male that presented secondary to bowel obstruction with perforation. General medicine consulted for tachycardia secondary to atrial fibrillation.   Assessment & Plan:   Active Problems:   Leukocytosis   Perforated bowel (HCC)   Malnutrition of moderate degree   Aspiration pneumonia (HCC)   Left lower lobe pneumonia Aspiration pneumonia Likely aspiration\. Tmax of 101.4 F. Significant leukocytosis (trending down) in setting of bowel perforation as well. Patient is on Zosyn for abdominal pathology which will be adequate/appropriate for pneumonia. Blood cultures (05/04/20) obtained and are no growth to date. CTA chest was negative for PE and showed evidence of emphysema. Patient is on room air with no dyspnea.  -Continue Zosyn IV  Atrial fibrillation with RVR Paroxysmal as patient was in sinus rhythm when I saw him. Rate treated initially with metoprolol IV. Transthoracic Echocardiogram ordered and is pending. Cardiology consulted by primary team -Per cardiology  Perforated bowel Patient is s/p exploratory laparotomy on 12/30. -Per primary   Elevated troponin No chest pain. In setting of bowel perforation/surgical repair. Peak troponin of 536 and downtrending. -Transthoracic Echocardiogram pending -Cardiology consulted by primary team  Leukocytosis Significant in setting of above. Patient also has chronic leukocytosis and follows with hematology as an outpatient.  Primary hypertension Patient is on lisinopril, hydrochlorothiazide as an outpatient. Patient required vasopressor support post-surgery -Continue to hold lisinopril-hydrochlorothiazide  Emphysema Noted on CT scan. Patient is currently on room air.  History of PE Patient is on Eliquis as an outpatient. Eliquis held in setting of  abdominal surgery. -Resume VTE treatment per primary  History of Crohns disease Patient is on budesonide as an outpatient   DVT prophylaxis: Per primary Code Status:   Code Status: Full Code Family Communication: None at bedside Disposition Plan: Per primary   Consultants:   General medicine  Cardiology  Procedures:   EXPLORATORY LAPAROTOMY (04/12/2020)  Antimicrobials:  Zosyn    Subjective: No dyspnea or chest pain. Some mild abdominal pain.  Objective: Vitals:   05/05/20 0500 05/05/20 0600 05/05/20 0700 05/05/20 0743  BP: (!) 112/49 112/87 112/68   Pulse: (!) 108 (!) 113 100 (!) 112  Resp: (!) 22 (!) 34 (!) 32 (!) 31  Temp:      TempSrc:      SpO2: 95% 95% 91% 93%  Weight:      Height:        Intake/Output Summary (Last 24 hours) at 05/05/2020 0918 Last data filed at 05/05/2020 0745 Gross per 24 hour  Intake 1199.37 ml  Output 1310 ml  Net -110.63 ml   Filed Weights   04/30/2020 1853 05/04/20 1225  Weight: 81.1 kg 74.9 kg    Examination:  General exam: Appears calm and comfortable Respiratory system: Diminished breath sounds. Respiratory effort normal. Cardiovascular system: S1 & S2 heard, RRR. No murmurs, rubs, gallops or clicks. Gastrointestinal system: Abdomen is distended, soft and moderately tender generalized. No organomegaly or masses felt. Decreased bowel sounds heard. Central nervous system: Alert and oriented. No focal neurological deficits. Musculoskeletal: No edema. No calf tenderness Skin: No cyanosis. No rashes Psychiatry: Judgement and insight appear normal. Mood & affect appropriate.     Data Reviewed: I have personally reviewed following labs and imaging studies  CBC Lab Results  Component Value Date   WBC 28.8 (H) 05/05/2020  RBC 2.76 (L) 05/05/2020   HGB 8.6 (L) 05/05/2020   HCT 27.4 (L) 05/05/2020   MCV 99.3 05/05/2020   MCH 31.2 05/05/2020   PLT 185 05/05/2020   MCHC 31.4 05/05/2020   RDW 16.3 (H) 05/05/2020    LYMPHSABS 1.3 05/05/2020   MONOABS 1.9 (H) 05/05/2020   EOSABS 0.1 05/05/2020   BASOSABS 0.1 53/61/4431     Last metabolic panel Lab Results  Component Value Date   NA 143 05/05/2020   K 3.5 05/05/2020   CL 115 (H) 05/05/2020   CO2 21 (L) 05/05/2020   BUN 17 05/05/2020   CREATININE 0.87 05/05/2020   GLUCOSE 132 (H) 05/05/2020   GFRNONAA >60 05/05/2020   GFRAA >60 10/14/2018   CALCIUM 8.0 (L) 05/05/2020   PHOS 2.8 05/05/2020   PROT 4.4 (L) 05/05/2020   ALBUMIN 2.0 (L) 05/05/2020   LABGLOB 2.8 10/14/2018   BILITOT 1.0 05/05/2020   ALKPHOS 38 05/05/2020   AST 14 (L) 05/05/2020   ALT 11 05/05/2020   ANIONGAP 7 05/05/2020    CBG (last 3)  Recent Labs    05/04/20 2211 05/05/20 0643 05/05/20 0735  GLUCAP 175* 117* 131*     GFR: Estimated Creatinine Clearance: 88.6 mL/min (by C-G formula based on SCr of 0.87 mg/dL).  Coagulation Profile: No results for input(s): INR, PROTIME in the last 168 hours.  Recent Results (from the past 240 hour(s))  Resp Panel by RT-PCR (Flu A&B, Covid) Nasopharyngeal Swab     Status: None   Collection Time: 04/09/2020 10:40 AM   Specimen: Nasopharyngeal Swab; Nasopharyngeal(NP) swabs in vial transport medium  Result Value Ref Range Status   SARS Coronavirus 2 by RT PCR NEGATIVE NEGATIVE Final    Comment: (NOTE) SARS-CoV-2 target nucleic acids are NOT DETECTED.  The SARS-CoV-2 RNA is generally detectable in upper respiratory specimens during the acute phase of infection. The lowest concentration of SARS-CoV-2 viral copies this assay can detect is 138 copies/mL. A negative result does not preclude SARS-Cov-2 infection and should not be used as the sole basis for treatment or other patient management decisions. A negative result may occur with  improper specimen collection/handling, submission of specimen other than nasopharyngeal swab, presence of viral mutation(s) within the areas targeted by this assay, and inadequate number of  viral copies(<138 copies/mL). A negative result must be combined with clinical observations, patient history, and epidemiological information. The expected result is Negative.  Fact Sheet for Patients:  EntrepreneurPulse.com.au  Fact Sheet for Healthcare Providers:  IncredibleEmployment.be  This test is no t yet approved or cleared by the [place] FDA and  has been authorized for detection and/or diagnosis of SARS-CoV-2 by FDA under an Emergency Use Authorization (EUA). This EUA will remain  in effect (meaning this test can be used) for the duration of the COVID-19 declaration under Section 564(b)(1) of the Act, 21 U.S.C.section 360bbb-3(b)(1), unless the authorization is terminated  or revoked sooner.       Influenza A by PCR NEGATIVE NEGATIVE Final   Influenza B by PCR NEGATIVE NEGATIVE Final    Comment: (NOTE) The Xpert Xpress SARS-CoV-2/FLU/RSV plus assay is intended as an aid in the diagnosis of influenza from Nasopharyngeal swab specimens and should not be used as a sole basis for treatment. Nasal washings and aspirates are unacceptable for Xpert Xpress SARS-CoV-2/FLU/RSV testing.  Fact Sheet for Patients: EntrepreneurPulse.com.au  Fact Sheet for Healthcare Providers: IncredibleEmployment.be  This test is not yet approved or cleared by the [place] and  has been authorized for detection and/or diagnosis of SARS-CoV-2 by FDA under an Emergency Use Authorization (EUA). This EUA will remain in effect (meaning this test can be used) for the duration of the COVID-19 declaration under Section 564(b)(1) of the Act, 21 U.S.C. section 360bbb-3(b)(1), unless the authorization is terminated or revoked.  Performed at [facility], [address] [place], [place] [address]   CULTURE, BLOOD (ROUTINE X 2) w Reflex to ID Panel     Status: None (Preliminary result)   Collection Time:  05/04/20 11:27 PM   Specimen: BLOOD  Result Value Ref Range Status   Specimen Description BLOOD BLOOD RIGHT FOREARM  Final   Special Requests   Final    BOTTLES DRAWN AEROBIC AND ANAEROBIC Blood Culture adequate volume   Culture   Final    NO GROWTH < 12 HOURS Performed at [facility], [address] [place], [place] [address]    Report Status PENDING  Incomplete  CULTURE, BLOOD (ROUTINE X 2) w Reflex to ID Panel     Status: None (Preliminary result)   Collection Time: 05/05/20  6:29 AM   Specimen: BLOOD  Result Value Ref Range Status   Specimen Description BLOOD RIGHT [facility]  Final   Special Requests   Final    BOTTLES DRAWN AEROBIC AND ANAEROBIC Blood Culture adequate volume   Culture   Final    NO GROWTH <12 HOURS Performed at [facility], [address] [place], [place] [address]    Report Status PENDING  Incomplete        Radiology Studies: CT ANGIO CHEST PE W OR WO CONTRAST  Result Date: 05/05/2020 CLINICAL DATA:  Pulmonary embolism, status post small bowel resection pod # 1 EXAM: CT ANGIOGRAPHY CHEST WITH CONTRAST TECHNIQUE: Multidetector CT imaging of the chest was performed using the standard protocol during bolus administration of intravenous contrast. Multiplanar CT image reconstructions and MIPs were obtained to evaluate the vascular anatomy. CONTRAST:  80m OMNIPAQUE IOHEXOL 350 MG/ML SOLN COMPARISON:  10/14/2017, 04/23/2020 FINDINGS: Cardiovascular: Satisfactory opacification of the pulmonary arteries to the segmental level. No evidence of pulmonary embolism. Normal heart size. No pericardial effusion. The thoracic aorta is unremarkable. Right internal jugular central venous catheter tip is seen within the proximal superior vena cava. Mediastinum/Nodes: Visualized thyroid is unremarkable. No pathologic thoracic adenopathy. Nasogastric tube extends into the proximal body of the stomach. The esophagus is otherwise unremarkable. Lungs/Pleura: There is  dense consolidation of the left lower lobe in keeping with changes of acute lobar pneumonia. There is extensive endobronchial debris within the a left lower lobe segmental bronchi. Superimposed mild to moderate centrilobular emphysema. No pneumothorax. Trace left pleural effusion. Upper Abdomen: At least mild hepatic steatosis noted. Surgical drain is partially visualized within the epigastric region. Punctate free intraperitoneal gas is likely postsurgical in nature. Musculoskeletal: No acute bone abnormality. Review of the MIP images confirms the above findings. IMPRESSION: No pulmonary embolism. Dense consolidation of the left lower lobe with extensive endobronchial debris in keeping with aspiration or infection. Moderate centrilobular emphysema. Emphysema (ICD10-J43.9). Electronically Signed   By: AFidela SalisburyMD   On: 05/05/2020 00:13   DG CHEST PORT 1 VIEW  Result Date: 05/05/2020 CLINICAL DATA:  Enteric tube placement EXAM: PORTABLE CHEST 1 VIEW COMPARISON:  Chest radiograph from one day prior. FINDINGS: Right internal jugular central venous catheter terminates in the middle third of the SVC. Enteric tube terminates in the proximal stomach. Stable cardiomediastinal silhouette with normal heart size. No  pneumothorax. Stable small left pleural effusion. No right pleural effusion. No pulmonary edema. Increased patchy left retrocardiac consolidation. IMPRESSION: 1. Enteric tube terminates in the proximal stomach. 2. Stable small left pleural effusion. 3. Increased patchy left retrocardiac consolidation, either atelectasis, aspiration or pneumonia. Electronically Signed   By: Ilona Sorrel M.D.   On: 05/05/2020 06:07   DG CHEST PORT 1 VIEW  Result Date: 05/04/2020 CLINICAL DATA:  Tachycardia and shortness of breath EXAM: PORTABLE CHEST 1 VIEW COMPARISON:  04/27/2018 FINDINGS: A right central venous catheter has been placed with tip over the mid SVC region. No pneumothorax. An enteric tube is present with  tip projected at or just below the expected location of the EG junction. Proximal side hole is projected in the lower esophagus region. Advancement is suggested. Mild cardiac enlargement. Small left pleural effusion with infiltration or atelectasis in the left lung base. Surgical clips at the EG junction region. IMPRESSION: Enteric tube tip is at or just below the expected location of the EG junction. Advancement is suggested. Small left pleural effusion with infiltration or atelectasis in the left lung base. Electronically Signed   By: Lucienne Capers M.D.   On: 05/04/2020 22:29   [place] EKG SITE RITE  Result Date: 05/04/2020 If Site Rite image not attached, placement could not be confirmed due to current cardiac rhythm.       Scheduled Meds: . Chlorhexidine Gluconate Cloth  6 each Topical Daily  . insulin aspart  0-9 Units Subcutaneous Q8H  . ipratropium-albuterol  3 mL Nebulization Q6H  . mouth rinse  15 mL Mouth Rinse q12n4p  . octreotide  50 mcg Subcutaneous Q8H  . [START ON 05/07/2020] pantoprazole  40 mg Intravenous Q12H   Continuous Infusions: . lactated ringers 75 mL/hr at 05/05/20 0105  . lactated ringers    . pantoprozole (PROTONIX) infusion 8 mg/hr (05/05/20 0227)  . piperacillin-tazobactam (ZOSYN)  IV 3.375 g (05/05/20 0111)  . TPN ADULT (ION) Stopped (05/04/20 2156)  . TPN ADULT (ION)       LOS: 2 days     Cordelia Poche, MD [facility] 05/05/2020, 9:18 AM  If 7PM-7AM, please contact night-coverage [email or web address]

## 2020-05-05 DEATH — deceased

## 2020-05-06 ENCOUNTER — Inpatient Hospital Stay: Payer: Medicare PPO

## 2020-05-06 ENCOUNTER — Other Ambulatory Visit: Payer: Self-pay

## 2020-05-06 ENCOUNTER — Inpatient Hospital Stay: Payer: Self-pay

## 2020-05-06 DIAGNOSIS — D72829 Elevated white blood cell count, unspecified: Secondary | ICD-10-CM | POA: Diagnosis not present

## 2020-05-06 DIAGNOSIS — J9601 Acute respiratory failure with hypoxia: Secondary | ICD-10-CM

## 2020-05-06 DIAGNOSIS — J690 Pneumonitis due to inhalation of food and vomit: Secondary | ICD-10-CM | POA: Diagnosis not present

## 2020-05-06 DIAGNOSIS — I480 Paroxysmal atrial fibrillation: Secondary | ICD-10-CM | POA: Diagnosis not present

## 2020-05-06 DIAGNOSIS — J9600 Acute respiratory failure, unspecified whether with hypoxia or hypercapnia: Secondary | ICD-10-CM

## 2020-05-06 DIAGNOSIS — K631 Perforation of intestine (nontraumatic): Principal | ICD-10-CM

## 2020-05-06 DIAGNOSIS — K56609 Unspecified intestinal obstruction, unspecified as to partial versus complete obstruction: Secondary | ICD-10-CM | POA: Diagnosis not present

## 2020-05-06 DIAGNOSIS — E440 Moderate protein-calorie malnutrition: Secondary | ICD-10-CM | POA: Diagnosis not present

## 2020-05-06 DIAGNOSIS — J9602 Acute respiratory failure with hypercapnia: Secondary | ICD-10-CM

## 2020-05-06 LAB — PROCALCITONIN: Procalcitonin: 1.04 ng/mL

## 2020-05-06 LAB — CBC WITH DIFFERENTIAL/PLATELET
Abs Immature Granulocytes: 0.8 10*3/uL — ABNORMAL HIGH (ref 0.00–0.07)
Basophils Absolute: 0.1 10*3/uL (ref 0.0–0.1)
Basophils Relative: 0 %
Eosinophils Absolute: 0.5 10*3/uL (ref 0.0–0.5)
Eosinophils Relative: 2 %
HCT: 24.4 % — ABNORMAL LOW (ref 39.0–52.0)
Hemoglobin: 7.8 g/dL — ABNORMAL LOW (ref 13.0–17.0)
Immature Granulocytes: 4 %
Lymphocytes Relative: 5 %
Lymphs Abs: 1 10*3/uL (ref 0.7–4.0)
MCH: 31.1 pg (ref 26.0–34.0)
MCHC: 32 g/dL (ref 30.0–36.0)
MCV: 97.2 fL (ref 80.0–100.0)
Monocytes Absolute: 1.6 10*3/uL — ABNORMAL HIGH (ref 0.1–1.0)
Monocytes Relative: 7 %
Neutro Abs: 17.2 10*3/uL — ABNORMAL HIGH (ref 1.7–7.7)
Neutrophils Relative %: 82 %
Platelets: 185 10*3/uL (ref 150–400)
RBC: 2.51 MIL/uL — ABNORMAL LOW (ref 4.22–5.81)
RDW: 16.2 % — ABNORMAL HIGH (ref 11.5–15.5)
WBC: 20.8 10*3/uL — ABNORMAL HIGH (ref 4.0–10.5)
nRBC: 0.4 % — ABNORMAL HIGH (ref 0.0–0.2)

## 2020-05-06 LAB — CBC
HCT: 25.3 % — ABNORMAL LOW (ref 39.0–52.0)
Hemoglobin: 7.9 g/dL — ABNORMAL LOW (ref 13.0–17.0)
MCH: 31.1 pg (ref 26.0–34.0)
MCHC: 31.2 g/dL (ref 30.0–36.0)
MCV: 99.6 fL (ref 80.0–100.0)
Platelets: 161 10*3/uL (ref 150–400)
RBC: 2.54 MIL/uL — ABNORMAL LOW (ref 4.22–5.81)
RDW: 16.2 % — ABNORMAL HIGH (ref 11.5–15.5)
WBC: 20 10*3/uL — ABNORMAL HIGH (ref 4.0–10.5)
nRBC: 0.2 % (ref 0.0–0.2)

## 2020-05-06 LAB — BLOOD GAS, ARTERIAL
Acid-base deficit: 2.4 mmol/L — ABNORMAL HIGH (ref 0.0–2.0)
Acid-base deficit: 3.4 mmol/L — ABNORMAL HIGH (ref 0.0–2.0)
Bicarbonate: 20.9 mmol/L (ref 20.0–28.0)
Bicarbonate: 23.1 mmol/L (ref 20.0–28.0)
FIO2: 0.36
FIO2: 1
MECHVT: 430 mL
O2 Saturation: 93.7 %
O2 Saturation: 95.7 %
PEEP: 5 cmH2O
Patient temperature: 37
Patient temperature: 37
RATE: 18 resp/min
pCO2 arterial: 30 mmHg — ABNORMAL LOW (ref 32.0–48.0)
pCO2 arterial: 48 mmHg (ref 32.0–48.0)
pH, Arterial: 7.45 (ref 7.350–7.450)
pH, Arterial: 7.29 — ABNORMAL LOW (ref 7.350–7.450)
pO2, Arterial: 66 mmHg — ABNORMAL LOW (ref 83.0–108.0)
pO2, Arterial: 89 mmHg (ref 83.0–108.0)

## 2020-05-06 LAB — BASIC METABOLIC PANEL
Anion gap: 6 (ref 5–15)
BUN: 18 mg/dL (ref 8–23)
CO2: 22 mmol/L (ref 22–32)
Calcium: 8.1 mg/dL — ABNORMAL LOW (ref 8.9–10.3)
Chloride: 115 mmol/L — ABNORMAL HIGH (ref 98–111)
Creatinine, Ser: 0.8 mg/dL (ref 0.61–1.24)
GFR, Estimated: >60 mL/min (ref >60)
Glucose, Bld: 136 mg/dL — ABNORMAL HIGH (ref 70–99)
Potassium: 3.8 mmol/L (ref 3.5–5.1)
Sodium: 143 mmol/L (ref 135–145)

## 2020-05-06 LAB — LACTIC ACID, PLASMA
Lactic Acid, Venous: 2.4 mmol/L (ref 0.5–1.9)
Lactic Acid, Venous: 2.1 mmol/L (ref 0.5–1.9)

## 2020-05-06 LAB — GLUCOSE, CAPILLARY
Glucose-Capillary: 122 mg/dL — ABNORMAL HIGH (ref 70–99)
Glucose-Capillary: 130 mg/dL — ABNORMAL HIGH (ref 70–99)
Glucose-Capillary: 155 mg/dL — ABNORMAL HIGH (ref 70–99)
Glucose-Capillary: 130 mg/dL — ABNORMAL HIGH (ref 70–99)
Glucose-Capillary: 169 mg/dL — ABNORMAL HIGH (ref 70–99)

## 2020-05-06 LAB — COMPREHENSIVE METABOLIC PANEL
ALT: 11 U/L (ref 0–44)
AST: 19 U/L (ref 15–41)
Albumin: 1.7 g/dL — ABNORMAL LOW (ref 3.5–5.0)
Alkaline Phosphatase: 40 U/L (ref 38–126)
Anion gap: 11 (ref 5–15)
BUN: 14 mg/dL (ref 8–23)
CO2: 22 mmol/L (ref 22–32)
Calcium: 7.7 mg/dL — ABNORMAL LOW (ref 8.9–10.3)
Chloride: 113 mmol/L — ABNORMAL HIGH (ref 98–111)
Creatinine, Ser: 0.81 mg/dL (ref 0.61–1.24)
GFR, Estimated: >60 mL/min (ref >60)
Glucose, Bld: 150 mg/dL — ABNORMAL HIGH (ref 70–99)
Potassium: 3.3 mmol/L — ABNORMAL LOW (ref 3.5–5.1)
Sodium: 146 mmol/L — ABNORMAL HIGH (ref 135–145)
Total Bilirubin: 0.8 mg/dL (ref 0.3–1.2)
Total Protein: 4.2 g/dL — ABNORMAL LOW (ref 6.5–8.1)

## 2020-05-06 LAB — ECHOCARDIOGRAM COMPLETE
AV Peak grad: 7.6 mmHg
Ao pk vel: 1.38 m/s
Area-P 1/2: 5.66 cm2
Height: 70 in
S' Lateral: 2.46 cm
Weight: 2641.99 oz

## 2020-05-06 LAB — MAGNESIUM: Magnesium: 1.9 mg/dL (ref 1.7–2.4)

## 2020-05-06 LAB — HEPATIC FUNCTION PANEL
ALT: 10 U/L (ref 0–44)
AST: 16 U/L (ref 15–41)
Albumin: 2 g/dL — ABNORMAL LOW (ref 3.5–5.0)
Alkaline Phosphatase: 41 U/L (ref 38–126)
Bilirubin, Direct: 0.4 mg/dL — ABNORMAL HIGH (ref 0.0–0.2)
Indirect Bilirubin: 0.6 mg/dL (ref 0.3–0.9)
Total Bilirubin: 1 mg/dL (ref 0.3–1.2)
Total Protein: 4.3 g/dL — ABNORMAL LOW (ref 6.5–8.1)

## 2020-05-06 LAB — MRSA PCR SCREENING: MRSA by PCR: POSITIVE — AB

## 2020-05-06 LAB — TSH: TSH: 0.053 u[IU]/mL — ABNORMAL LOW (ref 0.350–4.500)

## 2020-05-06 MED ORDER — ETOMIDATE 2 MG/ML IV SOLN
20.0000 mg | Freq: Once | INTRAVENOUS | Status: AC
  Administered 2020-05-06: 20 mg via INTRAVENOUS

## 2020-05-06 MED ORDER — MIDAZOLAM HCL 2 MG/2ML IJ SOLN
2.0000 mg | INTRAMUSCULAR | Status: DC | PRN
  Administered 2020-05-06 – 2020-05-07 (×2): 2 mg via INTRAVENOUS
  Filled 2020-05-06 (×3): qty 2

## 2020-05-06 MED ORDER — FUROSEMIDE 10 MG/ML IJ SOLN
40.0000 mg | Freq: Once | INTRAMUSCULAR | Status: AC
  Administered 2020-05-06: 40 mg via INTRAVENOUS
  Filled 2020-05-06: qty 4

## 2020-05-06 MED ORDER — FENTANYL 2500MCG IN NS 250ML (10MCG/ML) PREMIX INFUSION
25.0000 ug/h | INTRAVENOUS | Status: DC
  Administered 2020-05-06: 100 ug/h via INTRAVENOUS
  Administered 2020-05-07 – 2020-05-08 (×5): 200 ug/h via INTRAVENOUS
  Filled 2020-05-06 (×5): qty 250

## 2020-05-06 MED ORDER — ALBUMIN HUMAN 25 % IV SOLN
25.0000 g | INTRAVENOUS | Status: AC
  Administered 2020-05-06: 25 g via INTRAVENOUS
  Filled 2020-05-06: qty 100

## 2020-05-06 MED ORDER — FENTANYL BOLUS VIA INFUSION: 50.0000 ug | INTRAVENOUS | Status: DC | PRN

## 2020-05-06 MED ORDER — POTASSIUM CHLORIDE 10 MEQ/100ML IV SOLN
10.0000 meq | INTRAVENOUS | Status: AC
  Administered 2020-05-06 (×4): 10 meq via INTRAVENOUS
  Filled 2020-05-06 (×4): qty 100

## 2020-05-06 MED ORDER — LACTATED RINGERS IV SOLN: INTRAVENOUS | Status: DC

## 2020-05-06 MED ORDER — FENTANYL 2500MCG IN NS 250ML (10MCG/ML) PREMIX INFUSION
INTRAVENOUS | Status: AC
  Filled 2020-05-06: qty 250

## 2020-05-06 MED ORDER — VANCOMYCIN HCL 750 MG/150ML IV SOLN
750.0000 mg | Freq: Three times a day (TID) | INTRAVENOUS | Status: DC
  Administered 2020-05-07 – 2020-05-08 (×4): 750 mg via INTRAVENOUS
  Filled 2020-05-06 (×8): qty 150

## 2020-05-06 MED ORDER — MIDAZOLAM HCL 2 MG/2ML IJ SOLN
INTRAMUSCULAR | Status: AC
  Filled 2020-05-06: qty 2

## 2020-05-06 MED ORDER — FENTANYL CITRATE (PF) 100 MCG/2ML IJ SOLN
INTRAMUSCULAR | Status: AC
  Filled 2020-05-06: qty 2

## 2020-05-06 MED ORDER — FENTANYL BOLUS VIA INFUSION
50.0000 ug | INTRAVENOUS | Status: DC | PRN
  Administered 2020-05-06 – 2020-05-07 (×2): 100 ug via INTRAVENOUS
  Filled 2020-05-06: qty 100

## 2020-05-06 MED ORDER — FENTANYL CITRATE (PF) 100 MCG/2ML IJ SOLN
50.0000 ug | Freq: Once | INTRAMUSCULAR | Status: AC
Start: 2020-05-06 — End: 2020-05-06
  Administered 2020-05-06: 50 ug via INTRAVENOUS

## 2020-05-06 MED ORDER — MUPIROCIN 2 % EX OINT
1.0000 "application " | TOPICAL_OINTMENT | Freq: Two times a day (BID) | CUTANEOUS | Status: DC
  Administered 2020-05-07 – 2020-05-08 (×5): 1 via NASAL
  Filled 2020-05-06: qty 22

## 2020-05-06 MED ORDER — SODIUM CHLORIDE 0.9 % IV SOLN
250.0000 mL | INTRAVENOUS | Status: DC
  Administered 2020-05-06 – 2020-05-08 (×2): 250 mL via INTRAVENOUS

## 2020-05-06 MED ORDER — HEPARIN SODIUM (PORCINE) 5000 UNIT/ML IJ SOLN
5000.0000 [IU] | Freq: Three times a day (TID) | INTRAMUSCULAR | Status: DC
  Administered 2020-05-06 – 2020-05-09 (×8): 5000 [IU] via SUBCUTANEOUS
  Filled 2020-05-06 (×8): qty 1

## 2020-05-06 MED ORDER — NOREPINEPHRINE 16 MG/250ML-% IV SOLN
0.0000 ug/min | INTRAVENOUS | Status: DC
  Filled 2020-05-06: qty 250

## 2020-05-06 MED ORDER — AMIODARONE IV BOLUS ONLY 150 MG/100ML
INTRAVENOUS | Status: AC
  Administered 2020-05-06: 150 mg via INTRAVENOUS
  Filled 2020-05-06: qty 100

## 2020-05-06 MED ORDER — PHENYLEPHRINE CONCENTRATED 100MG/250ML (0.4 MG/ML) INFUSION SIMPLE
0.0000 ug/min | INTRAVENOUS | Status: DC
  Administered 2020-05-06: 50 ug/min via INTRAVENOUS
  Administered 2020-05-07: 400 ug/min via INTRAVENOUS
  Administered 2020-05-07: 230 ug/min via INTRAVENOUS
  Administered 2020-05-07: 250 ug/min via INTRAVENOUS
  Administered 2020-05-07 – 2020-05-09 (×8): 400 ug/min via INTRAVENOUS
  Filled 2020-05-06 (×13): qty 250

## 2020-05-06 MED ORDER — TRAVASOL 10 % IV SOLN
INTRAVENOUS | Status: AC
  Filled 2020-05-06: qty 1200

## 2020-05-06 MED ORDER — FENTANYL BOLUS VIA INFUSION
50.0000 ug | INTRAVENOUS | Status: DC | PRN
  Filled 2020-05-06: qty 50

## 2020-05-06 MED ORDER — INSULIN ASPART 100 UNIT/ML ~~LOC~~ SOLN
0.0000 [IU] | Freq: Four times a day (QID) | SUBCUTANEOUS | Status: DC
  Administered 2020-05-06 (×2): 2 [IU] via SUBCUTANEOUS
  Administered 2020-05-06: 1 [IU] via SUBCUTANEOUS
  Filled 2020-05-06 (×2): qty 1

## 2020-05-06 MED ORDER — DOCUSATE SODIUM 50 MG/5ML PO LIQD
100.0000 mg | Freq: Two times a day (BID) | ORAL | Status: DC
  Filled 2020-05-06 (×3): qty 10

## 2020-05-06 MED ORDER — ORAL CARE MOUTH RINSE
15.0000 mL | OROMUCOSAL | Status: DC
  Administered 2020-05-06 – 2020-05-09 (×24): 15 mL via OROMUCOSAL

## 2020-05-06 MED ORDER — NOREPINEPHRINE 4 MG/250ML-% IV SOLN
2.0000 ug/min | INTRAVENOUS | Status: DC
  Administered 2020-05-06: 2 ug/min via INTRAVENOUS
  Filled 2020-05-06: qty 250

## 2020-05-06 MED ORDER — POLYETHYLENE GLYCOL 3350 17 G PO PACK
17.0000 g | PACK | Freq: Every day | ORAL | Status: DC
  Filled 2020-05-06: qty 1

## 2020-05-06 MED ORDER — VANCOMYCIN HCL 1750 MG/350ML IV SOLN
1750.0000 mg | Freq: Once | INTRAVENOUS | Status: AC
  Administered 2020-05-06: 1750 mg via INTRAVENOUS
  Filled 2020-05-06: qty 350

## 2020-05-06 MED ORDER — MIDAZOLAM HCL 2 MG/2ML IJ SOLN
2.0000 mg | Freq: Once | INTRAMUSCULAR | Status: AC
  Administered 2020-05-06: 2 mg via INTRAVENOUS

## 2020-05-06 MED ORDER — MAGNESIUM SULFATE IN D5W 1-5 GM/100ML-% IV SOLN
1.0000 g | Freq: Once | INTRAVENOUS | Status: AC
  Administered 2020-05-06: 1 g via INTRAVENOUS
  Filled 2020-05-06: qty 100

## 2020-05-06 MED ORDER — LACTATED RINGERS IV BOLUS: 250.0000 mL | Freq: Once | INTRAVENOUS | Status: DC

## 2020-05-06 MED ORDER — MIDAZOLAM HCL 2 MG/2ML IJ SOLN
INTRAMUSCULAR | Status: AC
  Administered 2020-05-06: 2 mg via INTRAVENOUS
  Filled 2020-05-06: qty 2

## 2020-05-06 MED ORDER — DEXMEDETOMIDINE HCL IN NACL 400 MCG/100ML IV SOLN
0.4000 ug/kg/h | INTRAVENOUS | Status: DC
  Administered 2020-05-06: 0.4 ug/kg/h via INTRAVENOUS
  Administered 2020-05-07 – 2020-05-08 (×7): 1.2 ug/kg/h via INTRAVENOUS
  Filled 2020-05-06 (×8): qty 100

## 2020-05-06 MED ORDER — ROCURONIUM BROMIDE 50 MG/5ML IV SOLN
INTRAVENOUS | Status: AC
  Filled 2020-05-06: qty 1

## 2020-05-06 MED ORDER — LACTATED RINGERS IV BOLUS
1000.0000 mL | Freq: Once | INTRAVENOUS | Status: AC
  Administered 2020-05-06: 1000 mL via INTRAVENOUS

## 2020-05-06 MED ORDER — AMIODARONE IV BOLUS ONLY 150 MG/100ML: 150.0000 mg | Freq: Once | INTRAVENOUS | Status: AC

## 2020-05-06 MED ORDER — CHLORHEXIDINE GLUCONATE 0.12% ORAL RINSE (MEDLINE KIT)
15.0000 mL | Freq: Two times a day (BID) | OROMUCOSAL | Status: DC
  Administered 2020-05-06 – 2020-05-09 (×6): 15 mL via OROMUCOSAL

## 2020-05-06 MED ORDER — ETOMIDATE 2 MG/ML IV SOLN
INTRAVENOUS | Status: AC
  Filled 2020-05-06: qty 10

## 2020-05-06 MED ORDER — ALBUMIN HUMAN 25 % IV SOLN
25.0000 g | Freq: Two times a day (BID) | INTRAVENOUS | Status: DC
  Administered 2020-05-06: 25 g via INTRAVENOUS
  Filled 2020-05-06 (×2): qty 100

## 2020-05-06 MED ORDER — ROCURONIUM BROMIDE 50 MG/5ML IV SOLN
40.0000 mg | Freq: Once | INTRAVENOUS | Status: AC
  Administered 2020-05-06: 40 mg via INTRAVENOUS

## 2020-05-06 NOTE — Consult Note (Addendum)
PHARMACY - TOTAL PARENTERAL NUTRITION CONSULT NOTE   Indication: Fistula  Patient Measurements: Height: 5' 10"  (177.8 cm) Weight: 74.9 kg (165 lb 2 oz) IBW/kg (Calculated) : 73 TPN AdjBW (KG): 81.1 Body mass index is 23.69 kg/m.  Assessment:  Patient is a 65 y/o M with medical history including Crohn's disease, history of PE, GERD who presented to the ED 12/30 with severe abdominal pain. Patient went for emergent exploratory laparotomy with small bowel resection and lysis of adhesions. 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   Glucose / Insulin: No apparent history of diabetes. BG 111 - 150 last 24h, 2 units insulin used.  Electrolytes: Mild hyperchloremia, K=3.3 with episode of a fib with RVR 1/1 am - repleted by MD ith KCL IV 1mq x 4 - will follow Renal: Scr 0.53 >> 0.73>0.87>0.81 since admission LFTs / TGs: Transaminases within normal limits. TG ordered for tomorrow AM. Prealbumin / albumin: Albumin 2.7 on 12/30 Intake / Output; MIVF: Net + 2.3L this admission.  GI Imaging: 12/30 CT Abdomen Pelvis:   Suspicious for a closed loop proximal jejunal obstruction due to an internal hernia.  Suspicious for chronic and acute diverticulitis involving the proximal to mid sigmoid colon with associated interval irregular adjacent soft tissue mass measuring 5.4 x 4.6 x 3.4 cm.  Small to moderate amount of free peritoneal fluid. Surgeries / Procedures:  12/30 Exploratory laparotomy with small bowel resection and lysis of adhesions  Central access: 12/30 (CVC Double Lumen 04/23/2020 Right Internal jugular) TPN start date: 12/31   Nutritional Goals (per RD recommendation on 12/31): kCal: 2100 - 2300, Protein: 110-120g, Fluid: 2.2 - 2.6L Goal TPN rate is 100 mL/hr (provides 120 g of protein and 2227 kcals per day)  Current Nutrition:  NPO  Plan:  --Increase TPN to goal rate at 100 mL/hr at 1800  Fluid: 2400 mL  Protein: 120 g  Dextrose: 408 g  Lipids: 36  g  Kcal: 2227  --Electrolytes in TPN: 589m/L of Na, 5040mL of K, 5mE57m of Ca, 5mEq18mof Mg, and 15mmo62mof Phos. Continue Cl:Ac ratio to 1:2 - Repleted K with 10meq 18m 4, Mg 1.9 - repleted with 1g IV bolus --Add standard MVI and trace elements to TPN, add thiamine for re-feed risk --Initiate Sensitive q8h SSI and adjust as needed  --Will d/c MIVF at 1800  Adjust MIVF based on TPN rate for goal 100 mL/hr  Discussed with surgeon, will change MIVF from NS to 1/2 NS given hyperchloremia (now LR) - will reduce rate with increase in TPN rate --Monitor TPN labs on Mon/Thurs, daily until stable --Given lipid emulsion shortage, will reduce lipid content in TPN to target 15% of total daily kCals from SMOFlipid instead of 30% to align with guidance put forth. AA and dextrose content increased to meet calorie requirements.   CharlesLu DuffelD, BCPS Clinical Pharmacist 05/06/2020 7:53 AM

## 2020-05-06 NOTE — Procedures (Signed)
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  Spouse however notified via phone.  Hand washing performed prior to starting the procedure.   Medications administered for sedation prior to procedure:  Midazolam 2 mg IV, Rocuronium 40 mg IV, Fentanyl 52mg IV.  Etomidate 20 mg IV  A time out procedure was called and correct patient, name, & ID confirmed. Needed supplies and equipment were assembled and checked to include ETT, 10 ml syringe, Glidescope, Mac and Miller blades, suction, oxygen and bag mask valve, end tidal CO2 monitor.   Patient was positioned to align the mouth and pharynx to facilitate visualization of the glottis.   Heart rate, SpO2 and blood pressure was continuously monitored during the procedure. Pre-oxygenation was conducted prior to intubation.  The airway was easily visualized.  The patient had copious purulent secretions pooled in the posterior pharynx.  He also had significant edema of the false vocal cords.  At this point an 8.0 ET tube was placed under direct visualization via glidescope route on the first attempt.  ETT was secured at 24cm mark.  Placement was confirmed by auscuitation of lungs with good breath sounds bilaterally and no epigastric sounds.  Condensation was noted on endotracheal tube. Pulse ox 100 %.  CO2 detector in place with appropriate color change.   Complications: None .   Operator: Brent Diaz Chest radiograph ET tube in place with no pneumothorax.  Patient has bilateral infiltrates.    Comments: Patient has NGT already in place, this was not disrupted.  Brent Don MD [facility]   *This note was dictated using voice recognition software/Dragon.  Despite best efforts to proofread, errors can occur which can change the meaning.  Any change was purely unintentional.

## 2020-05-06 NOTE — Progress Notes (Signed)
Progress Note  Patient Name: Brent Diaz Date of Encounter: 05/06/2020  [facility] Cardiologist: No primary care provider on file.   Subjective   Opening eyes today, denies any distress Will answer questions, quickly will drift back to sleep Telemetry reviewed, sinus rhythm rate 90 bpm  Inpatient Medications    Scheduled Meds: . Chlorhexidine Gluconate Cloth  6 each Topical Daily  . insulin aspart  0-9 Units Subcutaneous Q6H  . ipratropium-albuterol  3 mL Nebulization Q6H  . mouth rinse  15 mL Mouth Rinse q12n4p  . octreotide  50 mcg Subcutaneous Q8H   Continuous Infusions: . amiodarone 30 mg/hr (05/06/20 1148)  . lactated ringers    . lactated ringers 40 mL/hr at 05/06/20 1148  . pantoprazole (PROTONIX) IV Stopped (05/06/20 1147)  . piperacillin-tazobactam (ZOSYN)  IV Stopped (05/06/20 0953)  . TPN ADULT (ION) 66 mL/hr at 05/06/20 1148  . TPN ADULT (ION)     PRN Meds: acetaminophen **OR** acetaminophen, ketorolac, metoprolol tartrate, morphine injection, ondansetron **OR** ondansetron (ZOFRAN) IV, promethazine   Vital Signs    Vitals:   05/06/20 1100 05/06/20 1115 05/06/20 1130 05/06/20 1145  BP: (!) 129/50 (!) 125/51 (!) 133/56 139/63  Pulse: 96 96 98 (!) 103  Resp: 20 (!) 25 (!) 32 (!) 35  Temp:   99.4 F (37.4 C)   TempSrc:   Axillary   SpO2: 92% 94% 95% 92%  Weight:      Height:        Intake/Output Summary (Last 24 hours) at 05/06/2020 1412 Last data filed at 05/06/2020 1148 Gross per 24 hour  Intake 2691.98 ml  Output 1750 ml  Net 941.98 ml   Last 3 Weights 05/04/2020 04/21/2020 04/23/2020  Weight (lbs) 165 lb 2 oz 178 lb 12.7 oz 165 lb  Weight (kg) 74.9 kg 81.1 kg 74.844 kg      Telemetry    Normal sinus rhythm rate 90- Personally Reviewed  ECG     - Personally Reviewed  Physical Exam   GEN: No acute distress.   Neck:  Unable to estimate JVD Cardiac: RRR, no murmurs, rubs, or gallops.  Respiratory:  Scattered Rales GI: Soft,  nontender, mildly distended MS: No edema; No deformity. Neuro:   Full exam not performed Psych: Lethargic  Labs    High Sensitivity Troponin:   Recent Labs  Lab 05/05/20 0225 05/05/20 0517 05/05/20 0629  TROPONINIHS 536* 489* 474*      Chemistry Recent Labs  Lab 05/02/2020 0426 05/04/20 0630 05/05/20 0225 05/05/20 0629 05/06/20 0302  NA 139   < > 143 143 146*  K 3.8   < > 3.5 4.0 3.3*  CL 106   < > 115* 111 113*  CO2 23   < > 21* 22 22  GLUCOSE 149*   < > 132* 117* 150*  BUN 19   < > 17 16 14   CREATININE 0.53*   < > 0.87 0.88 0.81  CALCIUM 8.6*   < > 8.0* 7.8* 7.7*  PROT 5.8*  --  4.4*  --  4.2*  ALBUMIN 2.7*  --  2.0* 2.0* 1.7*  AST 16  --  14*  --  19  ALT 15  --  11  --  11  ALKPHOS 56  --  38  --  40  BILITOT 0.5  --  1.0  --  0.8  GFRNONAA >60   < > >60 >60 >60  ANIONGAP 10   < > 7 10  11   < > = values in this interval not displayed.     Hematology Recent Labs  Lab 05/04/20 2237 05/05/20 0225 05/06/20 0302  WBC 32.2* 28.8* 20.0*  RBC 3.01* 2.76* 2.54*  HGB 9.4* 8.6* 7.9*  HCT 30.0* 27.4* 25.3*  MCV 99.7 99.3 99.6  MCH 31.2 31.2 31.1  MCHC 31.3 31.4 31.2  RDW 16.3* 16.3* 16.2*  PLT 203 185 161    BNP Recent Labs  Lab 05/04/20 2207  BNP 193.7*     DDimer No results for input(s): DDIMER in the last 168 hours.   Radiology    CT ABDOMEN PELVIS WO CONTRAST  Result Date: 05/05/2020 CLINICAL DATA:  Abdominal pain, fever.  Postop jejunal perforation EXAM: CT ABDOMEN AND PELVIS WITHOUT CONTRAST TECHNIQUE: Multidetector CT imaging of the abdomen and pelvis was performed following the standard protocol without IV contrast. COMPARISON:  04/13/2020 FINDINGS: Lower chest: Consolidation posteriorly in the left lower lobe. Trace left pleural effusion. Heart is normal size. Right base atelectasis. Hepatobiliary: Layering high-density material within the gallbladder may reflect vicarious excretion of bowel. No focal hepatic abnormality. Pancreas: No focal  abnormality or ductal dilatation. Spleen: Calcifications throughout the spleen.  Normal size. Adrenals/Urinary Tract: Small hyperdense lesion off the upper pole of the right kidney measures 2 cm, likely hemorrhagic cyst, stable. Small hyperdense lesion off the upper pole of the left kidney measures 1.2 cm, stable. IV contrast noted within the collecting systems from prior contrast CT. Adrenal glands and urinary bladder unremarkable. No hydronephrosis. Stomach/Bowel: Postoperative changes in the jejunum. No current evidence of bowel obstruction. Sigmoid diverticulosis. Continued inflammation around the sigmoid colon with soft tissue thickening and stranding around the sigmoid colon along with locules of extraluminal gas concerning for diverticulitis and perforation. Findings are similar to prior study. NG tube in the stomach. Vascular/Lymphatic: Aortic atherosclerosis. No evidence of aneurysm or adenopathy. Reproductive: No visible focal abnormality. Other: Small amount of free air in the upper abdomen, likely postoperative. Surgical drain noted in the left abdomen. Musculoskeletal: No acute bony abnormality. IMPRESSION: Postoperative changes in the region of the jejunum. Continued inflammation and extraluminal locules of gas adjacent to the sigmoid colon concerning for diverticulitis and contained perforation. This is unchanged since prior study. No bowel obstruction. Electronically Signed   By: Rolm Baptise M.D.   On: 05/05/2020 10:09   CT ANGIO CHEST PE W OR WO CONTRAST  Result Date: 05/05/2020 CLINICAL DATA:  Pulmonary embolism, status post small bowel resection pod # 1 EXAM: CT ANGIOGRAPHY CHEST WITH CONTRAST TECHNIQUE: Multidetector CT imaging of the chest was performed using the standard protocol during bolus administration of intravenous contrast. Multiplanar CT image reconstructions and MIPs were obtained to evaluate the vascular anatomy. CONTRAST:  77m OMNIPAQUE IOHEXOL 350 MG/ML SOLN COMPARISON:   10/14/2017, 04/27/2020 FINDINGS: Cardiovascular: Satisfactory opacification of the pulmonary arteries to the segmental level. No evidence of pulmonary embolism. Normal heart size. No pericardial effusion. The thoracic aorta is unremarkable. Right internal jugular central venous catheter tip is seen within the proximal superior vena cava. Mediastinum/Nodes: Visualized thyroid is unremarkable. No pathologic thoracic adenopathy. Nasogastric tube extends into the proximal body of the stomach. The esophagus is otherwise unremarkable. Lungs/Pleura: There is dense consolidation of the left lower lobe in keeping with changes of acute lobar pneumonia. There is extensive endobronchial debris within the a left lower lobe segmental bronchi. Superimposed mild to moderate centrilobular emphysema. No pneumothorax. Trace left pleural effusion. Upper Abdomen: At least mild hepatic steatosis noted. Surgical drain is  partially visualized within the epigastric region. Punctate free intraperitoneal gas is likely postsurgical in nature. Musculoskeletal: No acute bone abnormality. Review of the MIP images confirms the above findings. IMPRESSION: No pulmonary embolism. Dense consolidation of the left lower lobe with extensive endobronchial debris in keeping with aspiration or infection. Moderate centrilobular emphysema. Emphysema (ICD10-J43.9). Electronically Signed   By: Fidela Salisbury MD   On: 05/05/2020 00:13   DG CHEST PORT 1 VIEW  Result Date: 05/06/2020 CLINICAL DATA:  Tachypnea EXAM: PORTABLE CHEST 1 VIEW COMPARISON:  05/05/2020 FINDINGS: There is progressive left basilar consolidation. Tiny left pleural effusion may be present. Right lung is clear. Nasogastric tube extends into the upper abdomen beyond the margin of the examination. Right internal jugular central venous catheter is unchanged with its tip at this superior vena cava. Cardiac size within normal limits. No acute bone abnormality. IMPRESSION: Progressive left basilar  consolidation. Possible tiny associated pleural effusion. Electronically Signed   By: Fidela Salisbury MD   On: 05/06/2020 02:29   DG CHEST PORT 1 VIEW  Result Date: 05/05/2020 CLINICAL DATA:  Enteric tube placement EXAM: PORTABLE CHEST 1 VIEW COMPARISON:  Chest radiograph from one day prior. FINDINGS: Right internal jugular central venous catheter terminates in the middle third of the SVC. Enteric tube terminates in the proximal stomach. Stable cardiomediastinal silhouette with normal heart size. No pneumothorax. Stable small left pleural effusion. No right pleural effusion. No pulmonary edema. Increased patchy left retrocardiac consolidation. IMPRESSION: 1. Enteric tube terminates in the proximal stomach. 2. Stable small left pleural effusion. 3. Increased patchy left retrocardiac consolidation, either atelectasis, aspiration or pneumonia. Electronically Signed   By: Ilona Sorrel M.D.   On: 05/05/2020 06:07   DG CHEST PORT 1 VIEW  Result Date: 05/04/2020 CLINICAL DATA:  Tachycardia and shortness of breath EXAM: PORTABLE CHEST 1 VIEW COMPARISON:  04/27/2018 FINDINGS: A right central venous catheter has been placed with tip over the mid SVC region. No pneumothorax. An enteric tube is present with tip projected at or just below the expected location of the EG junction. Proximal side hole is projected in the lower esophagus region. Advancement is suggested. Mild cardiac enlargement. Small left pleural effusion with infiltration or atelectasis in the left lung base. Surgical clips at the EG junction region. IMPRESSION: Enteric tube tip is at or just below the expected location of the EG junction. Advancement is suggested. Small left pleural effusion with infiltration or atelectasis in the left lung base. Electronically Signed   By: Lucienne Capers M.D.   On: 05/04/2020 22:29   ECHOCARDIOGRAM COMPLETE  Result Date: 05/06/2020    ECHOCARDIOGRAM REPORT   Patient Name:   URIAS SHEEK Date of Exam: 05/05/2020  Medical Rec #:  [id number]      Height:       70.0 in Accession #:    [id number]     Weight:       165.1 lb Date of Birth:  09-Jun-1955      BSA:          1.924 m Patient Age:    65 years       BP:           118/61 mmHg Patient Gender: M              HR:           113 bpm. Exam Location:  [facility] Procedure: 2D Echo Indications:     Elevated Troponin  History:  Patient has no prior history of Echocardiogram examinations.  Sonographer:     Arville Go RDCS Referring Phys:  Strawn Diagnosing Phys: Ida Rogue MD  Sonographer Comments: Technically difficult study due to poor echo windows and no subcostal window. IMPRESSIONS  1. Left ventricular ejection fraction, by estimation, is 55 to 60%. The left ventricle has normal function. The left ventricle has no regional wall motion abnormalities. Left ventricular diastolic parameters are indeterminate.  2. Right ventricular systolic function is normal. The right ventricular size is normal. Tricuspid regurgitation signal is inadequate for assessing PA pressure.  3. Challenging images. FINDINGS  Left Ventricle: Left ventricular ejection fraction, by estimation, is 55 to 60%. The left ventricle has normal function. The left ventricle has no regional wall motion abnormalities. The left ventricular internal cavity size was normal in size. There is  no left ventricular hypertrophy. Left ventricular diastolic parameters are indeterminate. Right Ventricle: The right ventricular size is normal. No increase in right ventricular wall thickness. Right ventricular systolic function is normal. Tricuspid regurgitation signal is inadequate for assessing PA pressure. Left Atrium: Left atrial size was normal in size. Right Atrium: Right atrial size was normal in size. Pericardium: There is no evidence of pericardial effusion. Mitral Valve: The mitral valve is normal in structure. No evidence of mitral valve regurgitation. No evidence of mitral valve stenosis. Tricuspid  Valve: The tricuspid valve is normal in structure. Tricuspid valve regurgitation is mild . No evidence of tricuspid stenosis. Aortic Valve: The aortic valve is normal in structure. Aortic valve regurgitation is not visualized. No aortic stenosis is present. Aortic valve peak gradient measures 7.6 mmHg. Pulmonic Valve: The pulmonic valve was normal in structure. Pulmonic valve regurgitation is not visualized. No evidence of pulmonic stenosis. Aorta: The aortic root is normal in size and structure. Venous: The inferior vena cava is normal in size with greater than 50% respiratory variability, suggesting right atrial pressure of 3 mmHg. IAS/Shunts: No atrial level shunt detected by color flow Doppler.  LEFT VENTRICLE PLAX 2D LVIDd:         3.67 cm     Diastology LVIDs:         2.46 cm     LV e' medial:    7.72 cm/s LV PW:         0.92 cm     LV E/e' medial:  8.7 LV IVS:        1.24 cm     LV e' lateral:   7.83 cm/s LVOT diam:     1.90 cm     LV E/e' lateral: 8.5 LVOT Area:     2.84 cm  LV Volumes (MOD) LV vol d, MOD A4C: 21.4 ml LV SV MOD A4C:     21.4 ml LEFT ATRIUM           Index      RIGHT ATRIUM           Index LA diam:      1.80 cm 0.94 cm/m RA Area:     14.40 cm LA Vol (A4C): 18.2 ml 9.46 ml/m RA Volume:   37.40 ml  19.44 ml/m  AORTIC VALVE              PULMONIC VALVE AV Vmax:      138.00 cm/s PV Vmax:       1.45 m/s AV Peak Grad: 7.6 mmHg    PV Peak grad:  8.4 mmHg  AORTA Ao Root diam: 3.10 cm  Ao Asc diam:  3.30 cm MITRAL VALVE MV Area (PHT): 5.66 cm    SHUNTS MV Decel Time: 134 msec    Systemic Diam: 1.90 cm MV E velocity: 66.80 cm/s MV A velocity: 85.30 cm/s MV E/A ratio:  0.78 Ida Rogue MD Electronically signed by Ida Rogue MD Signature Date/Time: 05/06/2020/1:09:47 PM    Final     Cardiac Studies     Patient Profile     RYER ASATO is a 66 y.o. male with a hx of Crohn's disease, history of PE on Eliquis , prior bowel surgeries, presenting to the hospital with severe abdominal pain,  closed-loop proximal jejunal obstruction/internal hernia, chronic and acute diverticulitis, underwent exploratory lap, small bowel resection for proximal jejunal perforation, postop day 2, last night with narrow complex tachycardia concerning for atrial fibrillation, runs of SVT, nonsustained VT  Assessment & Plan     1.  Atrial fibrillation with RVR, paroxysmal Past 36 hours having paroxysmal atrial fibrillation, runs of VT up to 20 beats High risk of arrhythmia given recent GI surgery, associated stressors, fever 101 -Rhythm has dramatically improved on amiodarone infusion -Still not tolerating oral medications -We will continue amiodarone infusion with metoprolol IV as needed until taking oral medications  Left lower lobe pneumonia Chest x-ray today with progressive left basilar consolidation  on broad-spectrum antibiotics, Zosyn  Perforated proximal jejunum Status post laparoscopic surgery, resection, lysis of adhesions -Postop day 3   Total encounter time more than 25 minutes  Greater than 50% was spent in counseling and coordination of care with the patient     For questions or updates, please contact [facility] Please consult [email or web address] for contact info under        Signed, Ida Rogue, MD  05/06/2020, 2:12 PM

## 2020-05-06 NOTE — Consult Note (Signed)
Pharmacy Antibiotic Note  Brent Diaz is a 65 y.o. male admitted on 04/30/2020 with Peritonitis. Pt presented via EMS with severe abdominal pain associated with hematemesis and hematochezia. PMH include Crohn's, bowel resection, GERD, HTN, and PE in 2019. Pt underwent emergent surgery for closed-loop bowel obstruction following reversal of apixaban on 12/30. Patient has been on Zosyn since 12/30. Patient also noted to have LLL PNA. Pharmacy is now also consulted for vancomycin dosing for sepsis. Day 4 IV abx, WBC 32.8>36.7>28.8>20; however, spiked a fever on 1/2 0200 and again at 1500. Stat CXR ordered and reviewed - per radiologist, progressing infiltrate from prior film.   12/30 CT abd: Findings suspicious for a closed loop proximal jejunal obstruction due to an internal hernia.   Plan: Will give Vancomycin 1793m IV x1 loading dose, followed by 7538mIV q8h   Continue Zosyn 3.375g IV q8h (4 hour infusion)  Monitor renal function and adjust doses as clinically indicated Obtain vanc level prior to 4th or 5th dose  Temp (24hrs), Avg:99.3 F (37.4 C), Min:97.4 F (36.3 C), Max:101.5 F (38.6 C)  Recent Labs  Lab 04/09/2020 1311 05/04/20 0630 05/04/20 2237 05/04/20 2310 05/05/20 0225 05/05/20 0235 05/05/20 0517 05/05/20 0629 05/05/20 0957 05/05/20 1207 05/06/20 0302  WBC 36.7* 36.1* 32.2*  --  28.8*  --   --   --   --   --  20.0*  CREATININE  --  0.73 1.06  --  0.87  --   --  0.88  --   --  0.81  LATICACIDVEN  --   --   --    < >  --  2.3* 2.3* 3.7* 4.7* 4.2*  --    < > = values in this interval not displayed.    Estimated Creatinine Clearance: 95.1 mL/min (by C-G formula based on SCr of 0.81 mg/dL).    Allergies  Allergen Reactions  . Amlodipine Swelling    feet  . Aspirin Other (See Comments)    Peptic ulcers  . Flagyl [Metronidazole]     Gives neuropathy if taken too long     Antimicrobials this admission: 12/30 Zosyn >> 01/02 Vancomycin >>  Microbiology  results: 12/30 Resp Panel: negative 12/31 Bcx: NGTD  Thank you for allowing pharmacy to be a part of this patient's care.  SaSherilyn BankerPharmD Pharmacy Resident  05/06/2020 4:39 PM

## 2020-05-06 NOTE — Consult Note (Signed)
NAME:  Brent Diaz, MRN:  [id number], DOB:  Aug 27, 1955, LOS: 3 ADMISSION DATE:  04/16/2020, CONSULTATION DATE:  05/06/2020 REFERRING MD:  Dr. Lysle Pearl, CHIEF COMPLAINT: abdominal pain  Brief History:  65 year old male with Crohn's disease admitted with closed-loop bowel obstruction status post ex lap and small bowel resection for proximal jejunal perforation now with suspected left lower lobe aspiration pneumonia.  History of Present Illness:  65 year old male with significant history of Crohn's disease and previous small bowel resection, presented to the ED with suprapubic abdominal pain.  Patient reported sharp LUQ pain without radiation, associated with nausea and vomiting and exacerbated by touch.  Patient had bloody vomitus while in the ED and reported an episode of dark tarry stools on 05/02/2020.  Patient reported that he is on Eliquis for previous pulmonary embolism.  ED course: CT abdomen showed dilated jejunum and possible internal hernia and/or closed-loop bowel obstruction. Labs: WBC 32.8, lactic 3.8 & Hgb stable at 12.6. Patient was taken emergently to the OR on 04/28/2020.  Postop course complicated by bile leak noted on 05/04/2020, given location of perforation and prior abdominal surgeries in the setting of baseline Crohn's surgery is attempting to avoid more procedures.  On 05/05/2020 patient developed narrow complex tachycardia overnight, with elevated troponin at 536~474 and complaints of shortness of breath.  CTa negative for PE, but showed new left lower lobe pneumonia.  Lactate increased to 6.8, down to 2.3 after fluid boluses.   On 05/06/2020 patient again developed shortness of breath unable to clear secretions, requiring NT suctioning by respiratory therapy, ultimately requiring rapid intubation and mechanical ventilation.  PCCM consulted for further management and monitoring. Past Medical History:  Crohn's Pulmonary embolism - 10/2017 Hypertension GERD Cancer  -skin Significant Hospital Events:  04/16/2020-admit to stepdown unit after emergent ex lap with small bowel resection for proximal jejunal perforation 05/04/20-bile leak noted, JP drain in place 05/05/20-narrow complex tachycardia, CTA negative for PE, new LLL pneumonia  Consults:  Cardiology Hospitalist  Procedures:  04/30/2020-ex lap and small bowel resection for proximal jejunal perforation 04/14/2020 - CVC 2 L RIJ>> 05/06/20 - ETT >> 05/06/20 - CVC 3 L R femoral >> Significant Diagnostic Tests:  05/02/2020 CT abdomen w contrast > findings suspicious for closed-loop proximal jejunal obstruction due to an internal hernia.  Mild wall thickening involving the jejunum distal to the focally dilated loop of jejunum in the left mid abdomen.  Findings suspicious for chronic and acute diverticulitis involving the proximal and mid sigmoid colon with associated interval irregular adjacent soft tissue mass measuring 5.4 x 4.6 x 3.4 cm.  Small to moderate amount of free peritoneal fluid.  1.2 cm medium density exophytic mass arising from the upper pole of the left kidney.  Mild diffuse hepatic steatosis. 05/04/2020 CTA chest > no pulmonary embolism, dense consolidation of the left lower lobe with extensive endobronchial debris.  Emphysema noted 05/05/2020 CT abdomen wo contrast > postoperative changes in the region of the jejunum, continued inflammation and extraluminal locules of gas adjacent to the sigmoid colon concerning for diverticulitis and contained perforation.  No bowel obstruction 05/05/2020 Echocardiogram > LVEF 55 to 60%, no regional wall motion abnormalities, no LVH.  Mild TR, all else WNL. Micro Data:  04/28/2020 COVID-19 > negative 04/06/2020 influenza A/B > negative 05/05/2020 BC x 2 >> 05/06/2020 Tracheal aspirate >>  Antimicrobials:  04/13/2020 Zosyn >> 05/06/2020 Vancomycin >>  Interim History / Subjective:  Patient emergently intubated requiring mechanical ventilation and sedated due to  inability to clear  increasing secretions and work of breathing. Wife updated by MD, general surgery attending-Dr. Lysle Pearl aware.  Labs/ Imaging personally reviewed Net: + 1.8 L -currently replacing albumin, receiving IV fluids due to concerns for intravascular dehydration.  Na+/ K+: 143/3.8 BUN/Cr.:  18/0.80 Hgb: 7.8  WBC/ TMAX: 20.8/38.5 Lactic/ PCT: 2.4~2.1/1.04  ABG: 7.29/ 48/ 89/ 23.1 > s/p intubation, ventilator settings adjusted CXR 05/06/2020: Tubes and lines in stable position, no significant interval change in bilateral airspace disease-left greater than right and small left pleural effusion since radiograph earlier today.  Overall worsening as compared with exams several days prior  Objective   Blood pressure (!) 91/58, pulse (!) 113, temperature (!) 101.5 F (38.6 C), temperature source Axillary, resp. rate (!) 29, height 5' 10"  (1.778 m), weight 74.9 kg, SpO2 95 %.    FiO2 (%):  [21 %] 21 %   Intake/Output Summary (Last 24 hours) at 05/06/2020 0334 Last data filed at 05/06/2020 0200 Gross per 24 hour  Intake 1496.88 ml  Output 2350 ml  Net -853.12 ml   Filed Weights   04/24/2020 1853 05/04/20 1225  Weight: 81.1 kg 74.9 kg    Examination: General: Adult male, critically ill, lying in bed intubated & sedated requiring mechanical ventilation  HEENT: MM pink/moist, anicteric, atraumatic, neck supple-ruddy complexion Neuro: Sedated, unable to follow commands, PERRL +3, MAE CV: s1s2 RRR, ST on monitor, no r/m/g Pulm: Regular, non labored on PRVC: 75%/ PEEP- 5, breath sounds rhonchi-BUL & diminished-BLL GI: soft, rounded with covered midline incision and JP drain, bs present GU: foley in place with clear yellow urine Skin: Known abdominal surgical incision, scattered keratosis appearing lesions that are intact, scattered scabbed abrasions and scattered ecchymosis Extremities: warm/dry, pulses + 2 R/P, no edema noted  Resolved Hospital Problem list     Assessment & Plan:   Acute Hypoxic / Hypercapnic Respiratory Failure  Secondary to / in the setting of LLL Pneumonia & Abdominal Surgery Suspected Aspiration Pneumonia CTa on 05/04/2020 showed dense consolidation with endobronchial debris in the left lower lobe, suspected aspiration.  Patient continues to have difficulty clearing secretions requiring NTS, ultimately needing emergent intubation on 05/06/20. - Ventilator settings: PRVC  7 mL/kg, 75% FiO2, 5 PEEP, continue ventilator support & lung protective strategies - Wean PEEP & FiO2 as tolerated, maintain SpO2 > 90%5 - Head of bed elevated 30 degrees, VAP protocol in place - Plateau pressures less than 30 cm H20  - Intermittent chest x-ray & ABG PRN - Daily WUA with SBT as tolerated  - Ensure adequate pulmonary hygiene  - F/u cultures, trend lactic/PCT - PAD protocol in place: continue Fentanyl drip & Precedex with Versed IVP. Avoiding propofol due to TPN administration  Leukocytosis In the setting of aspiration pneumonia and proximal jejunal perforation PMHx: Chronic leukocytosis and follows with hematology outpatient -Monitor daily WBC/fever curve -Continue Zosyn for intra-abdominal coverage & aspiration pneumonia -Vancomycin started 05/06/2020, MRSA PCR positive - f/u cultures -Trend lactic/PCT  Paroxysmal Atrial Fibrillation  Elevated troponin In the setting of bowel perforation/surgical repair PMHx: PE CHA2DS2-VASc score: 2 Echocardiogram completed-see above.  Past 36 hours PAF with runs of VT up to 20 beats, rhythm dramatically improved on amiodarone infusion.  Previous BB IVP was ineffective and dropped blood pressure.  Elevated troponin-peak at 536 and down trending, suspect demand ischemia - Continue Amiodarone infusion, cardiology recommendation to rebolused with amiodarone for any additional arrhythmias -Home regimen of Eliquis held at this time due to hematemesis on admission, reports of dark tarry stools &  recent abdominal surgery -VTE  prophylaxis okayed by general surgery and initiated 05/06/2020 - f/u TSH & thyroid panel - Cardiology consulted, appreciate input - Continuous cardiac monitoring  Perforated bowel -proximal jejunal perforation, with postsurgical leak Upper GI bleed PMHx: Crohn's S/p small bowel resection and JP drain placement for postsurgical leak.  Per general surgery patient has chronically inflamed tissue and previous abdominal surgeries.  This combined with chronic unhealthy bowel tissue in the setting of Crohn's Dr. Lysle Pearl is hoping to avoid another surgery and provide supportive care. 05/06/20-noted decrease of JP output & NGT output.  Dr. Lysle Pearl plans for reimaging 05/07/2020 if patient continues to decline, palliative care consult was also discussed. -Continue octreotide per surgery - Albumin 1.7, replacing with albumin twice daily x 6 doses - LR bolus & IVF, due to concerns of intravascular dehydration -Home regimen will budesonide on hold-Per general surgery  Hypertension -Hypotension in the setting of sedation: Ordered phenylephrine to maintain a MAP > 65.  Discontinuing peripheral Levophed due to recent history of tachyarrhythmias -Home regimen: Lisinopril/hydrochlorothiazide on hold  Goals of care Wife updated by MD regarding emergent intubation and mechanical ventilation.  Due to patient's inability to clear secretions & general surgery's reluctance for additional abdominal surgeries if needed.  Palliative care consult placed for 05/07/2020 to assist in plan of care discussions  Best practice (evaluated daily)  Diet: NPO Pain/Anxiety/Delirium protocol (if indicated): ketorolac & morphine as needed  VAP protocol (if indicated): N/A DVT prophylaxis: heparin SQ - OK'd by Dr. Lysle Pearl GI prophylaxis: protonix drip Glucose control: monitor Q 6 Mobility: bedrest, mobilize as tolerated Disposition: ICU  Goals of Care:  Last date of multidisciplinary goals of care discussion: 05/06/2020 Family and staff  present: Wife updated via telephone by MD Summary of discussion: Discussed plan of care and emergent intubation Follow up goals of care discussion due: 05/07/2020 Code Status: FULL  Labs   CBC: Recent Labs  Lab 04/17/2020 1311 05/04/20 0630 05/04/20 2237 05/05/20 0225 05/06/20 0302  WBC 36.7* 36.1* 32.2* 28.8* 20.0*  NEUTROABS  --   --   --  24.4*  --   HGB 11.7* 11.2* 9.4* 8.6* 7.9*  HCT 36.8* 34.1* 30.0* 27.4* 25.3*  MCV 97.6 96.3 99.7 99.3 99.6  PLT 361 221 203 185 308    Basic Metabolic Panel: Recent Labs  Lab 04/13/2020 0426 05/04/20 0630 05/04/20 2237 05/04/20 2310 05/05/20 0225 05/05/20 0629  NA 139 143 142  --  143 143  K 3.8 4.4 4.0  --  3.5 4.0  CL 106 115* 112*  --  115* 111  CO2 23 20* 15*  --  21* 22  GLUCOSE 149* 142* 156*  --  132* 117*  BUN 19 14 17   --  17 16  CREATININE 0.53* 0.73 1.06  --  0.87 0.88  CALCIUM 8.6* 7.7* 8.2*  --  8.0* 7.8*  MG  --   --  1.9 1.9 1.9  --   PHOS  --   --   --   --  2.8 2.5   GFR: Estimated Creatinine Clearance: 87.6 mL/min (by C-G formula based on SCr of 0.88 mg/dL). Recent Labs  Lab 05/04/20 0630 05/04/20 2237 05/04/20 2310 05/05/20 0225 05/05/20 0235 05/05/20 0517 05/05/20 0629 05/05/20 0957 05/05/20 1207 05/06/20 0302  PROCALCITON  --   --  0.51 0.46  --   --   --   --   --   --   WBC 36.1* 32.2*  --  28.8*  --   --   --   --   --  20.0*  LATICACIDVEN  --   --  6.8*  --    < > 2.3* 3.7* 4.7* 4.2*  --    < > = values in this interval not displayed.    Liver Function Tests: Recent Labs  Lab 04/14/2020 0426 05/05/20 0225 05/05/20 0629  AST 16 14*  --   ALT 15 11  --   ALKPHOS 56 38  --   BILITOT 0.5 1.0  --   PROT 5.8* 4.4*  --   ALBUMIN 2.7* 2.0* 2.0*   Recent Labs  Lab 04/04/2020 0426  LIPASE 20   No results for input(s): AMMONIA in the last 168 hours.  ABG    Component Value Date/Time   PHART 7.48 (H) 05/05/2020 1500   PCO2ART 26 (L) 05/05/2020 1500   PO2ART 67 (L) 05/05/2020 1500   HCO3  19.4 (L) 05/05/2020 1500   ACIDBASEDEF 3.3 (H) 05/05/2020 1500   O2SAT 94.4 05/05/2020 1500     Coagulation Profile: No results for input(s): INR, PROTIME in the last 168 hours.  Cardiac Enzymes: No results for input(s): CKTOTAL, CKMB, CKMBINDEX, TROPONINI in the last 168 hours.  HbA1C: Hgb A1c MFr Bld  Date/Time Value Ref Range Status  05/05/2020 02:35 AM 5.5 4.8 - 5.6 % Final    Comment:    (NOTE) Pre diabetes:          5.7%-6.4%  Diabetes:              >6.4%  Glycemic control for   <7.0% adults with diabetes     CBG: Recent Labs  Lab 05/04/20 2211 05/05/20 0643 05/05/20 0735 05/05/20 1321 05/05/20 2328  GLUCAP 175* 117* 131* 111* 122*    Review of Systems: Positives in BOLD   UTA-patient intubated and sedated  Past Medical History:  He,  has a past medical history of Cancer ([place]), Crohn's disease ([place]), GERD (gastroesophageal reflux disease), Hypertension, MRSA (methicillin resistant staph aureus) culture positive, Neutrophilia, Pulmonary embolism ([place]) (10/2017), Shingles, and Wears contact lenses.   Surgical History:   Past Surgical History:  Procedure Laterality Date  . ABDOMINAL SURGERY    . BOWEL RESECTION     x 3  . CATARACT EXTRACTION W/PHACO Left 10/18/2018   Procedure: CATARACT EXTRACTION PHACO AND INTRAOCULAR LENS PLACEMENT ([place]) LEFT;  Surgeon: Eulogio Bear, MD;  Location: [facility];  Service: Ophthalmology;  Laterality: Left;  . CATARACT EXTRACTION W/PHACO Right 12/06/2018   Procedure: CATARACT EXTRACTION PHACO AND INTRAOCULAR LENS PLACEMENT ([facility]) RIGHT;  Surgeon: Eulogio Bear, MD;  Location: [facility];  Service: Ophthalmology;  Laterality: Right;  . HYDROCELE EXCISION / REPAIR    . IMAGE GUIDED SINUS SURGERY N/A 05/21/2017   Procedure: IMAGE GUIDED SINUS SURGERY;  Surgeon: Margaretha Sheffield, MD;  Location: [facility];  Service: ENT;  Laterality: N/A;  need stryker disk  . LAPAROTOMY N/A 04/30/2020   Procedure:  EXPLORATORY LAPAROTOMY;  Surgeon: Benjamine Sprague, DO;  Location: [facility];  Service: General;  Laterality: N/A;  . MAXILLARY ANTROSTOMY Right 05/21/2017   Procedure: MAXILLARY ANTROSTOMY;  Surgeon: Margaretha Sheffield, MD;  Location: [facility];  Service: ENT;  Laterality: Right;  . SEPTOPLASTY N/A 05/21/2017   Procedure: SEPTOPLASTY;  Surgeon: Margaretha Sheffield, MD;  Location: [facility];  Service: ENT;  Laterality: N/A;     Social History:   reports that he has been smoking cigarettes. He has a 23.50 pack-year smoking history. He has never used smokeless tobacco. He reports that he  does not drink alcohol and does not use drugs.   Family History:  His family history includes Aneurysm (age of onset: 47) in his father; Dementia (age of onset: 50) in his mother.   Allergies Allergies  Allergen Reactions  . Amlodipine Swelling    feet  . Aspirin Other (See Comments)    Peptic ulcers  . Flagyl [Metronidazole]     Gives neuropathy if taken too long      Home Medications  Prior to Admission medications   Medication Sig Start Date End Date Taking? Authorizing Provider  apixaban (ELIQUIS) 5 MG TABS tablet Take 1 tablet (5 mg total) by mouth 2 (two) times daily. Start 6/19 10/21/17   Bettey Costa, MD  B Complex-Folic Acid (B COMPLEX-VITAMIN B12 PO) Take 1,000 mcg by mouth daily.     [provider]  budesonide (ENTOCORT EC) 3 MG 24 hr capsule Take 6 mg by mouth daily.     [provider]  butalbital-acetaminophen-caffeine (FIORICET) 50-325-40 MG tablet Take 1 tablet by mouth every 4 (four) hours as needed for headache.    [provider]  fluorouracil (EFUDEX) 5 % cream Apply topically. 09/19/19 09/18/20  [provider]  folic acid (FOLVITE) 1 MG tablet Take 1 mg by mouth daily. 06/21/16   [provider]  hyoscyamine (LEVSIN SL) 0.125 MG SL tablet Place 0.125 mg under the tongue every 4 (four) hours as needed.    [provider]   LISINOPRIL-HYDROCHLOROTHIAZIDE PO Take 1 tablet by mouth daily. 5 mg daily    [provider]  loperamide (IMODIUM) 2 MG capsule Take 2 mg by mouth as needed for diarrhea or loose stools.    [provider]  Melatonin 5 MG TABS Take 10 mg by mouth at bedtime as needed.    [provider]  montelukast (SINGULAIR) 10 MG tablet Take 10 mg by mouth every morning.     [provider]  nystatin (MYCOSTATIN) 100000 UNIT/ML suspension Swish and swallow 5 mLs 4 (four) times daily 12/22/19   [provider]  ondansetron (ZOFRAN ODT) 4 MG disintegrating tablet Take 1 tablet (4 mg total) by mouth every 8 (eight) hours as needed for nausea or vomiting. 04/27/18   Darel Hong, MD  pantoprazole (PROTONIX) 40 MG tablet Take 40 mg by mouth 2 (two) times daily. 04/14/16   [provider]  potassium chloride SA (K-DUR,KLOR-CON) 20 MEQ tablet Take 1 tablet by mouth daily.  10/08/17 04/11/20  [provider]  vitamin B-12 (CYANOCOBALAMIN) 1000 MCG tablet Take 1,000 mcg by mouth daily.    [provider]  Vitamin D, Ergocalciferol, (DRISDOL) 50000 units CAPS capsule Take 50,000 Units by mouth every 7 (seven) days.    [provider]  zolpidem (AMBIEN) 10 MG tablet Take 10 mg by mouth at bedtime as needed for sleep. Patient not taking: Reported on 03/27/2020    [provider]     Critical care time: 55 minutes     Venetia Night, AGACNP-BC Acute Care Nurse Practitioner [place]   [phone] / [phone] Please see Amion for pager details.

## 2020-05-06 NOTE — Care Management (Signed)
Received secure chat from [place] stating patient had more lethargy this afternoon.  Noted to be tachypneic respiratory rate 35-40.  Hypoxia requiring initiation of nasal cannula 3L saturating mid 90s on above.  Stat chest x-ray ordered and reviewed.  Per radiologist progressing infiltrate from prior film.  Could be representative of a new infectious process given mild breakthrough fever to 100.4.  Also possibility of pulmonary edema considering his new oxygen requirement and increased respiratory rate.  Per RN primary surgical team is aware and will round on patient when out of OR.  Recommendations: STAT CBC, BMP, lactic acid STAT EKG STAT ABG Hold maintenance fluids for now Lasix 18m IV x 1 Initiate vancomycin given breakthrough fever.  Pharmacy consulted for dosing  Patient may benefit from NIPPV if ABG demonstrates hypercarbia Also suggest intensivist consult, defer this decision to primary surgical team   SRalene MuskratMD

## 2020-05-06 NOTE — Progress Notes (Addendum)
Subjective:  CC: Brent Diaz is a 65 y.o. male  Hospital stay day 3, 3 Days Post-Op ex-lap, small bowel resection   HPI: Pt about to be intubated. Increased work of breathing and confusion reported by Therapist, sports.  ROS:  Unable to obtain secondary to patient status.   Objective:   Temp:  [97.4 F (36.3 C)-101.5 F (38.6 C)] 100.4 F (38 C) (01/02 1500) Pulse Rate:  [83-162] 114 (01/02 1700) Resp:  [19-41] 37 (01/02 1700) BP: (81-160)/(48-131) 148/61 (01/02 1700) SpO2:  [87 %-100 %] 95 % (01/02 1835) FiO2 (%):  [21 %-100 %] 100 % (01/02 1835)     Height: 5' 10"  (177.8 cm) Weight: 74.9 kg BMI (Calculated): 23.69   Intake/Output this shift:   Intake/Output Summary (Last 24 hours) at 05/06/2020 1904 Last data filed at 05/06/2020 1705 Gross per 24 hour  Intake 3093.16 ml  Output 1285 ml  Net 1808.16 ml   PE: deferred due to imminent intubation  LABS:  CMP Latest Ref Rng & Units 05/06/2020 05/06/2020 05/05/2020  Glucose 70 - 99 mg/dL 136(H) 150(H) 117(H)  BUN 8 - 23 mg/dL 18 14 16   Creatinine 0.61 - 1.24 mg/dL 0.80 0.81 0.88  Sodium 135 - 145 mmol/L 143 146(H) 143  Potassium 3.5 - 5.1 mmol/L 3.8 3.3(L) 4.0  Chloride 98 - 111 mmol/L 115(H) 113(H) 111  CO2 22 - 32 mmol/L 22 22 22   Calcium 8.9 - 10.3 mg/dL 8.1(L) 7.7(L) 7.8(L)  Total Protein 6.5 - 8.1 g/dL - 4.2(L) -  Total Bilirubin 0.3 - 1.2 mg/dL - 0.8 -  Alkaline Phos 38 - 126 U/L - 40 -  AST 15 - 41 U/L - 19 -  ALT 0 - 44 U/L - 11 -   CBC Latest Ref Rng & Units 05/06/2020 05/06/2020 05/05/2020  WBC 4.0 - 10.5 K/uL 20.8(H) 20.0(H) 28.8(H)  Hemoglobin 13.0 - 17.0 g/dL 7.8(L) 7.9(L) 8.6(L)  Hematocrit 39.0 - 52.0 % 24.4(L) 25.3(L) 27.4(L)  Platelets 150 - 400 K/uL 185 161 185    RADS: n/a Assessment:   S/p ex-lap, proximal jejunal resection and re-anastamosis for perforted 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  Now with acute respiratory failure requiring intubation.  Appreciate critical care  consult. Chart check and discussion with RN noted decreased JP output as well as NG output.  Possible octreotide working vs leak resolving vs JP malfunction.  No report of significant change in abdominal tenderness, and lactate downtrending.  Will consider re-imaging tomorrow if patient continues to clinically decline.  Also discussed palliative care consult.  Ok to resume DVT prophylaxis since hgb remains stable.  UPDATE: midline incision with purulent drainage at superior aspect.  Some staples removed and all drained.  Fascia intact.  Couple staples removed at bottom edge for some serous drainage.  Packed with 4x4 and will continue to monitor.

## 2020-05-06 NOTE — Progress Notes (Signed)
Pt intubated at approximately 1830. Fentanyl and precedex gtts administered for continuous sedation post intubation. CXR ordered to check placement. LR rate increased per MD. NG maintains intact.

## 2020-05-06 NOTE — Procedures (Signed)
Central Venous Catheter Insertion Procedure Note  PERCIVAL GLASHEEN  [id number]  February 07, 1956  Date:05/06/20  Time:10:18 PM   Provider Performing:Britton L Rust-Chester   Procedure: Insertion of Non-tunneled Central Venous Catheter(36556) with US guidance (05110)   Indication(s) Medication administration  Consent Unable to obtain consent due to emergent nature of procedure.  Anesthesia Topical only with 1% lidocaine   Timeout Verified patient identification, verified procedure, site/side was marked, verified correct patient position, special equipment/implants available, medications/allergies/relevant history reviewed, required imaging and test results available.  Sterile Technique Maximal sterile technique including full sterile barrier drape, hand hygiene, sterile gown, sterile gloves, mask, hair covering, sterile ultrasound probe cover (if used).  Procedure Description Area of catheter insertion was cleaned with chlorhexidine and draped in sterile fashion.  With real-time ultrasound guidance a central venous catheter was placed into the right femoral vein. Nonpulsatile blood flow and easy flushing noted in all ports.  The catheter was sutured in place and sterile dressing applied.  Complications/Tolerance None; patient tolerated the procedure well. Chest X-ray is ordered to verify placement for internal jugular or subclavian cannulation.   Chest x-ray is not ordered for femoral cannulation.  EBL Minimal  Specimen(s) None  Venetia Night, AGACNP-BC Acute Care Nurse Practitioner [facility]   [phone] / [phone] Please see Amion for pager details.

## 2020-05-06 NOTE — Progress Notes (Signed)
Pt more alert at beginning of shift. Pt speech garbled due to airway secretions, Unable to cough up anything despite, chest PT, Incentive spirometry, and deep breath cough. Pt is more lethargic this afternoon. Responds to voice but hard to maintain attention/re-orient. Placed on 3 L by [place]. HR 111-117, RR 30-40s, appears very SOB, Hospitalist made aware and new orders received for CXR and EKG. Per Dr. Lysle Pearl will round on pt when out of OR. Will continue to monitor.

## 2020-05-06 NOTE — Progress Notes (Signed)
PROGRESS NOTE    SECUNDINO ELLITHORPE  VWP:[id number] DOB: 1955/05/18 DOA: 04/19/2020 PCP: Lynnell Jude, MD   Primary team: General surgery  Brief Narrative: SERAPIO EDELSON is a 65 y.o. male that presented secondary to bowel obstruction with perforation. General medicine consulted for tachycardia secondary to atrial fibrillation.  1/2: Patient seen and evaluated.  Appears tachypneic and lethargic.  Does awaken easily and answers questions appropriately though to fatigue to participate in interview.  Thank you for the consult.  [facility] hospitalist service to follow this patient with you.   Assessment & Plan:   Active Problems:   Leukocytosis   Perforated bowel (HCC)   Malnutrition of moderate degree   Aspiration pneumonia (HCC)   Left lower lobe pneumonia Aspiration pneumonia Likely aspiration\. Tmax of 101.4 F.  Significant leukocytosis (trending down) in setting of bowel perforation as well.  On Zosyn for intra-abdominal prophylaxis Blood cultures (05/04/20) obtained and are no growth to date.  CTA chest was negative for PE and showed evidence of emphysema.  Patient is on room air however is tachypneic.  He has been started on TPN which could be contributing also with significant intra-abdominal pathology. Plan: Continue intravenous Zosyn Monitor fever curve Supplemental oxygen if necessary  Atrial fibrillation with RVR Ventricular rate is improved Patient has paroxysmal atrial fibrillation Rate was initially treated with metoprolol TTE ordered, normal EF with no [facility] Cardiology consulted by primary surgical team Plan:  On IV amiodarone status post bolus Continue amnio gtt. Additional amnio bolus for breakthrough tachyarrhythmias  Perforated bowel Patient is s/p exploratory laparotomy on 12/30. -Postsurgical care per primary team -TPN initiated  Elevated troponin No chest pain.  In setting of bowel perforation/surgical repair.  Peak troponin of 536 and  downtrending. Suspect this is demand ischemia Normal EF with no WMA on TTE  Leukocytosis Significant in setting of above.  Patient also has chronic leukocytosis and follows with hematology as an outpatient. WBC downtrending Continue antibiotics  Primary hypertension Patient is on lisinopril, hydrochlorothiazide as an outpatient.  Patient required vasopressor support post-surgery -Continue to hold lisinopril-hydrochlorothiazide  Emphysema Noted on CT scan. Patient is currently on room air.  History of PE Patient is on Eliquis as an outpatient.  Eliquis held in setting of abdominal surgery. -Resume VTE treatment per primary  History of Crohns disease Patient is on budesonide as an outpatient   DVT prophylaxis: Per primary Code Status:   Code Status: Full Code Family Communication: None at bedside Disposition Plan: Per primary surgical team   Consultants:   General medicine  Cardiology  Procedures:   EXPLORATORY LAPAROTOMY (05/02/2020)  Antimicrobials:  Zosyn    Subjective: Seen and examined.  Lethargic, easily awakened.  Mild abdominal pain.  Tachypneic  Objective: Vitals:   05/06/20 1100 05/06/20 1115 05/06/20 1130 05/06/20 1145  BP: (!) 129/50 (!) 125/51 (!) 133/56 139/63  Pulse: 96 96 98 (!) 103  Resp: 20 (!) 25 (!) 32 (!) 35  Temp:   99.4 F (37.4 C)   TempSrc:   Axillary   SpO2: 92% 94% 95% 92%  Weight:      Height:        Intake/Output Summary (Last 24 hours) at 05/06/2020 1350 Last data filed at 05/06/2020 1148 Gross per 24 hour  Intake 2691.98 ml  Output 1750 ml  Net 941.98 ml   Filed Weights   04/21/2020 1853 05/04/20 1225  Weight: 81.1 kg 74.9 kg    Examination:  General exam: Appears fatigued.  Mild  distress Respiratory system: Shallow respirations.  Normal work of breathing.  Room air.  Tachypneic Cardiovascular system: Regular rate, irregular rhythm, no appreciable murmurs  gastrointestinal system: Decreased bowel sounds,  distended, moderate tender to palpation Central nervous system: Alert and oriented. No focal neurological deficits. Musculoskeletal: No edema. No calf tenderness Skin: No cyanosis. No rashes Psychiatry: Judgement and insight appear normal. Mood & affect appropriate.     Data Reviewed: I have personally reviewed following labs and imaging studies  CBC Lab Results  Component Value Date   WBC 20.0 (H) 05/06/2020   RBC 2.54 (L) 05/06/2020   HGB 7.9 (L) 05/06/2020   HCT 25.3 (L) 05/06/2020   MCV 99.6 05/06/2020   MCH 31.1 05/06/2020   PLT 161 05/06/2020   MCHC 31.2 05/06/2020   RDW 16.2 (H) 05/06/2020   LYMPHSABS 1.3 05/05/2020   MONOABS 1.9 (H) 05/05/2020   EOSABS 0.1 05/05/2020   BASOSABS 0.1 02/26/8526     Last metabolic panel Lab Results  Component Value Date   NA 146 (H) 05/06/2020   K 3.3 (L) 05/06/2020   CL 113 (H) 05/06/2020   CO2 22 05/06/2020   BUN 14 05/06/2020   CREATININE 0.81 05/06/2020   GLUCOSE 150 (H) 05/06/2020   GFRNONAA >60 05/06/2020   GFRAA >60 10/14/2018   CALCIUM 7.7 (L) 05/06/2020   PHOS 2.5 05/05/2020   PROT 4.2 (L) 05/06/2020   ALBUMIN 1.7 (L) 05/06/2020   LABGLOB 2.8 10/14/2018   BILITOT 0.8 05/06/2020   ALKPHOS 40 05/06/2020   AST 19 05/06/2020   ALT 11 05/06/2020   ANIONGAP 11 05/06/2020    CBG (last 3)  Recent Labs    05/05/20 2328 05/06/20 0607 05/06/20 1154  GLUCAP 122* 130* 155*     GFR: Estimated Creatinine Clearance: 95.1 mL/min (by C-G formula based on SCr of 0.81 mg/dL).  Coagulation Profile: No results for input(s): INR, PROTIME in the last 168 hours.  Recent Results (from the past 240 hour(s))  Resp Panel by RT-PCR (Flu A&B, Covid) Nasopharyngeal Swab     Status: None   Collection Time: 04/24/2020 10:40 AM   Specimen: Nasopharyngeal Swab; Nasopharyngeal(NP) swabs in vial transport medium  Result Value Ref Range Status   SARS Coronavirus 2 by RT PCR NEGATIVE NEGATIVE Final    Comment: (NOTE) SARS-CoV-2 target  nucleic acids are NOT DETECTED.  The SARS-CoV-2 RNA is generally detectable in upper respiratory specimens during the acute phase of infection. The lowest concentration of SARS-CoV-2 viral copies this assay can detect is 138 copies/mL. A negative result does not preclude SARS-Cov-2 infection and should not be used as the sole basis for treatment or other patient management decisions. A negative result may occur with  improper specimen collection/handling, submission of specimen other than nasopharyngeal swab, presence of viral mutation(s) within the areas targeted by this assay, and inadequate number of viral copies(<138 copies/mL). A negative result must be combined with clinical observations, patient history, and epidemiological information. The expected result is Negative.  Fact Sheet for Patients:  EntrepreneurPulse.com.au  Fact Sheet for Healthcare Providers:  IncredibleEmployment.be  This test is no t yet approved or cleared by the [place] FDA and  has been authorized for detection and/or diagnosis of SARS-CoV-2 by FDA under an Emergency Use Authorization (EUA). This EUA will remain  in effect (meaning this test can be used) for the duration of the COVID-19 declaration under Section 564(b)(1) of the Act, 21 U.S.C.section 360bbb-3(b)(1), unless the authorization is terminated  or revoked sooner.  Influenza A by PCR NEGATIVE NEGATIVE Final   Influenza B by PCR NEGATIVE NEGATIVE Final    Comment: (NOTE) The Xpert Xpress SARS-CoV-2/FLU/RSV plus assay is intended as an aid in the diagnosis of influenza from Nasopharyngeal swab specimens and should not be used as a sole basis for treatment. Nasal washings and aspirates are unacceptable for Xpert Xpress SARS-CoV-2/FLU/RSV testing.  Fact Sheet for Patients: EntrepreneurPulse.com.au  Fact Sheet for Healthcare  Providers: IncredibleEmployment.be  This test is not yet approved or cleared by the [place] FDA and has been authorized for detection and/or diagnosis of SARS-CoV-2 by FDA under an Emergency Use Authorization (EUA). This EUA will remain in effect (meaning this test can be used) for the duration of the COVID-19 declaration under Section 564(b)(1) of the Act, 21 U.S.C. section 360bbb-3(b)(1), unless the authorization is terminated or revoked.  Performed at [facility], [address] [place], [place] [address]   CULTURE, BLOOD (ROUTINE X 2) w Reflex to ID Panel     Status: None (Preliminary result)   Collection Time: 05/04/20 11:27 PM   Specimen: BLOOD  Result Value Ref Range Status   Specimen Description BLOOD BLOOD RIGHT FOREARM  Final   Special Requests   Final    BOTTLES DRAWN AEROBIC AND ANAEROBIC Blood Culture adequate volume   Culture   Final    NO GROWTH 2 DAYS Performed at [facility], [address] [place], [place] [address]    Report Status PENDING  Incomplete  CULTURE, BLOOD (ROUTINE X 2) w Reflex to ID Panel     Status: None (Preliminary result)   Collection Time: 05/05/20  6:29 AM   Specimen: BLOOD  Result Value Ref Range Status   Specimen Description BLOOD RIGHT [facility]  Final   Special Requests   Final    BOTTLES DRAWN AEROBIC AND ANAEROBIC Blood Culture adequate volume   Culture   Final    NO GROWTH < 24 HOURS Performed at [facility], [address] [place], [place] [address]    Report Status PENDING  Incomplete        Radiology Studies: CT ABDOMEN PELVIS WO CONTRAST  Result Date: 05/05/2020 CLINICAL DATA:  Abdominal pain, fever.  Postop jejunal perforation EXAM: CT ABDOMEN AND PELVIS WITHOUT CONTRAST TECHNIQUE: Multidetector CT imaging of the abdomen and pelvis was performed following the standard protocol without IV contrast. COMPARISON:  04/05/2020 FINDINGS: Lower chest: Consolidation posteriorly  in the left lower lobe. Trace left pleural effusion. Heart is normal size. Right base atelectasis. Hepatobiliary: Layering high-density material within the gallbladder may reflect vicarious excretion of bowel. No focal hepatic abnormality. Pancreas: No focal abnormality or ductal dilatation. Spleen: Calcifications throughout the spleen.  Normal size. Adrenals/Urinary Tract: Small hyperdense lesion off the upper pole of the right kidney measures 2 cm, likely hemorrhagic cyst, stable. Small hyperdense lesion off the upper pole of the left kidney measures 1.2 cm, stable. IV contrast noted within the collecting systems from prior contrast CT. Adrenal glands and urinary bladder unremarkable. No hydronephrosis. Stomach/Bowel: Postoperative changes in the jejunum. No current evidence of bowel obstruction. Sigmoid diverticulosis. Continued inflammation around the sigmoid colon with soft tissue thickening and stranding around the sigmoid colon along with locules of extraluminal gas concerning for diverticulitis and perforation. Findings are similar to prior study. NG tube in the stomach. Vascular/Lymphatic: Aortic atherosclerosis. No evidence of aneurysm or adenopathy. Reproductive: No visible focal abnormality. Other: Small amount of free air in the upper abdomen, likely postoperative. Surgical drain  noted in the left abdomen. Musculoskeletal: No acute bony abnormality. IMPRESSION: Postoperative changes in the region of the jejunum. Continued inflammation and extraluminal locules of gas adjacent to the sigmoid colon concerning for diverticulitis and contained perforation. This is unchanged since prior study. No bowel obstruction. Electronically Signed   By: Rolm Baptise M.D.   On: 05/05/2020 10:09   CT ANGIO CHEST PE W OR WO CONTRAST  Result Date: 05/05/2020 CLINICAL DATA:  Pulmonary embolism, status post small bowel resection pod # 1 EXAM: CT ANGIOGRAPHY CHEST WITH CONTRAST TECHNIQUE: Multidetector CT imaging of the  chest was performed using the standard protocol during bolus administration of intravenous contrast. Multiplanar CT image reconstructions and MIPs were obtained to evaluate the vascular anatomy. CONTRAST:  69m OMNIPAQUE IOHEXOL 350 MG/ML SOLN COMPARISON:  10/14/2017, 04/05/2020 FINDINGS: Cardiovascular: Satisfactory opacification of the pulmonary arteries to the segmental level. No evidence of pulmonary embolism. Normal heart size. No pericardial effusion. The thoracic aorta is unremarkable. Right internal jugular central venous catheter tip is seen within the proximal superior vena cava. Mediastinum/Nodes: Visualized thyroid is unremarkable. No pathologic thoracic adenopathy. Nasogastric tube extends into the proximal body of the stomach. The esophagus is otherwise unremarkable. Lungs/Pleura: There is dense consolidation of the left lower lobe in keeping with changes of acute lobar pneumonia. There is extensive endobronchial debris within the a left lower lobe segmental bronchi. Superimposed mild to moderate centrilobular emphysema. No pneumothorax. Trace left pleural effusion. Upper Abdomen: At least mild hepatic steatosis noted. Surgical drain is partially visualized within the epigastric region. Punctate free intraperitoneal gas is likely postsurgical in nature. Musculoskeletal: No acute bone abnormality. Review of the MIP images confirms the above findings. IMPRESSION: No pulmonary embolism. Dense consolidation of the left lower lobe with extensive endobronchial debris in keeping with aspiration or infection. Moderate centrilobular emphysema. Emphysema (ICD10-J43.9). Electronically Signed   By: AFidela SalisburyMD   On: 05/05/2020 00:13   DG CHEST PORT 1 VIEW  Result Date: 05/06/2020 CLINICAL DATA:  Tachypnea EXAM: PORTABLE CHEST 1 VIEW COMPARISON:  05/05/2020 FINDINGS: There is progressive left basilar consolidation. Tiny left pleural effusion may be present. Right lung is clear. Nasogastric tube extends  into the upper abdomen beyond the margin of the examination. Right internal jugular central venous catheter is unchanged with its tip at this superior vena cava. Cardiac size within normal limits. No acute bone abnormality. IMPRESSION: Progressive left basilar consolidation. Possible tiny associated pleural effusion. Electronically Signed   By: AFidela SalisburyMD   On: 05/06/2020 02:29   DG CHEST PORT 1 VIEW  Result Date: 05/05/2020 CLINICAL DATA:  Enteric tube placement EXAM: PORTABLE CHEST 1 VIEW COMPARISON:  Chest radiograph from one day prior. FINDINGS: Right internal jugular central venous catheter terminates in the middle third of the SVC. Enteric tube terminates in the proximal stomach. Stable cardiomediastinal silhouette with normal heart size. No pneumothorax. Stable small left pleural effusion. No right pleural effusion. No pulmonary edema. Increased patchy left retrocardiac consolidation. IMPRESSION: 1. Enteric tube terminates in the proximal stomach. 2. Stable small left pleural effusion. 3. Increased patchy left retrocardiac consolidation, either atelectasis, aspiration or pneumonia. Electronically Signed   By: JIlona SorrelM.D.   On: 05/05/2020 06:07   DG CHEST PORT 1 VIEW  Result Date: 05/04/2020 CLINICAL DATA:  Tachycardia and shortness of breath EXAM: PORTABLE CHEST 1 VIEW COMPARISON:  04/27/2018 FINDINGS: A right central venous catheter has been placed with tip over the mid SVC region. No pneumothorax. An enteric tube is present  with tip projected at or just below the expected location of the EG junction. Proximal side hole is projected in the lower esophagus region. Advancement is suggested. Mild cardiac enlargement. Small left pleural effusion with infiltration or atelectasis in the left lung base. Surgical clips at the EG junction region. IMPRESSION: Enteric tube tip is at or just below the expected location of the EG junction. Advancement is suggested. Small left pleural effusion with  infiltration or atelectasis in the left lung base. Electronically Signed   By: Lucienne Capers M.D.   On: 05/04/2020 22:29   ECHOCARDIOGRAM COMPLETE  Result Date: 05/06/2020    ECHOCARDIOGRAM REPORT   Patient Name:   AMARIE TARTE Date of Exam: 05/05/2020 Medical Rec #:  [id number]      Height:       70.0 in Accession #:    [id number]     Weight:       165.1 lb Date of Birth:  04-12-1956      BSA:          1.924 m Patient Age:    43 years       BP:           118/61 mmHg Patient Gender: M              HR:           113 bpm. Exam Location:  [facility] Procedure: 2D Echo Indications:     Elevated Troponin  History:         Patient has no prior history of Echocardiogram examinations.  Sonographer:     Arville Go RDCS Referring Phys:  [place] Diagnosing Phys: Ida Rogue MD  Sonographer Comments: Technically difficult study due to poor echo windows and no subcostal window. IMPRESSIONS  1. Left ventricular ejection fraction, by estimation, is 55 to 60%. The left ventricle has normal function. The left ventricle has no regional wall motion abnormalities. Left ventricular diastolic parameters are indeterminate.  2. Right ventricular systolic function is normal. The right ventricular size is normal. Tricuspid regurgitation signal is inadequate for assessing PA pressure.  3. Challenging images. FINDINGS  Left Ventricle: Left ventricular ejection fraction, by estimation, is 55 to 60%. The left ventricle has normal function. The left ventricle has no regional wall motion abnormalities. The left ventricular internal cavity size was normal in size. There is  no left ventricular hypertrophy. Left ventricular diastolic parameters are indeterminate. Right Ventricle: The right ventricular size is normal. No increase in right ventricular wall thickness. Right ventricular systolic function is normal. Tricuspid regurgitation signal is inadequate for assessing PA pressure. Left Atrium: Left atrial size was normal in  size. Right Atrium: Right atrial size was normal in size. Pericardium: There is no evidence of pericardial effusion. Mitral Valve: The mitral valve is normal in structure. No evidence of mitral valve regurgitation. No evidence of mitral valve stenosis. Tricuspid Valve: The tricuspid valve is normal in structure. Tricuspid valve regurgitation is mild . No evidence of tricuspid stenosis. Aortic Valve: The aortic valve is normal in structure. Aortic valve regurgitation is not visualized. No aortic stenosis is present. Aortic valve peak gradient measures 7.6 mmHg. Pulmonic Valve: The pulmonic valve was normal in structure. Pulmonic valve regurgitation is not visualized. No evidence of pulmonic stenosis. Aorta: The aortic root is normal in size and structure. Venous: The inferior vena cava is normal in size with greater than 50% respiratory variability, suggesting right atrial pressure of 3 mmHg. IAS/Shunts: No atrial level  shunt detected by color flow Doppler.  LEFT VENTRICLE PLAX 2D LVIDd:         3.67 cm     Diastology LVIDs:         2.46 cm     LV e' medial:    7.72 cm/s LV PW:         0.92 cm     LV E/e' medial:  8.7 LV IVS:        1.24 cm     LV e' lateral:   7.83 cm/s LVOT diam:     1.90 cm     LV E/e' lateral: 8.5 LVOT Area:     2.84 cm  LV Volumes (MOD) LV vol d, MOD A4C: 21.4 ml LV SV MOD A4C:     21.4 ml LEFT ATRIUM           Index      RIGHT ATRIUM           Index LA diam:      1.80 cm 0.94 cm/m RA Area:     14.40 cm LA Vol (A4C): 18.2 ml 9.46 ml/m RA Volume:   37.40 ml  19.44 ml/m  AORTIC VALVE              PULMONIC VALVE AV Vmax:      138.00 cm/s PV Vmax:       1.45 m/s AV Peak Grad: 7.6 mmHg    PV Peak grad:  8.4 mmHg  AORTA Ao Root diam: 3.10 cm Ao Asc diam:  3.30 cm MITRAL VALVE MV Area (PHT): 5.66 cm    SHUNTS MV Decel Time: 134 msec    Systemic Diam: 1.90 cm MV E velocity: 66.80 cm/s MV A velocity: 85.30 cm/s MV E/A ratio:  0.78 Ida Rogue MD Electronically signed by Ida Rogue MD  Signature Date/Time: 05/06/2020/1:09:47 PM    Final         Scheduled Meds: . Chlorhexidine Gluconate Cloth  6 each Topical Daily  . insulin aspart  0-9 Units Subcutaneous Q6H  . ipratropium-albuterol  3 mL Nebulization Q6H  . mouth rinse  15 mL Mouth Rinse q12n4p  . octreotide  50 mcg Subcutaneous Q8H   Continuous Infusions: . amiodarone 30 mg/hr (05/06/20 1148)  . lactated ringers    . lactated ringers 40 mL/hr at 05/06/20 1148  . pantoprazole (PROTONIX) IV Stopped (05/06/20 1147)  . piperacillin-tazobactam (ZOSYN)  IV Stopped (05/06/20 0953)  . TPN ADULT (ION) 66 mL/hr at 05/06/20 1148  . TPN ADULT (ION)       LOS: 3 days   Time spent: 25 minutes  Ralene Muskrat, MD [facility] 05/06/2020, 1:50 PM  If 7PM-7AM, please contact night-coverage [email or web address]

## 2020-05-07 ENCOUNTER — Telehealth: Payer: Self-pay

## 2020-05-07 DIAGNOSIS — I480 Paroxysmal atrial fibrillation: Secondary | ICD-10-CM | POA: Diagnosis not present

## 2020-05-07 LAB — BLOOD GAS, ARTERIAL
Acid-base deficit: 3.8 mmol/L — ABNORMAL HIGH (ref 0.0–2.0)
Bicarbonate: 23 mmol/L (ref 20.0–28.0)
FIO2: 0.6
MECHVT: 480 mL
O2 Saturation: 95.3 %
PEEP: 5 cmH2O
Patient temperature: 37
RATE: 20 resp/min
pCO2 arterial: 50 mmHg — ABNORMAL HIGH (ref 32.0–48.0)
pH, Arterial: 7.27 — ABNORMAL LOW (ref 7.350–7.450)
pO2, Arterial: 88 mmHg (ref 83.0–108.0)

## 2020-05-07 LAB — CBC
HCT: 24.1 % — ABNORMAL LOW (ref 39.0–52.0)
Hemoglobin: 7.4 g/dL — ABNORMAL LOW (ref 13.0–17.0)
MCH: 31.1 pg (ref 26.0–34.0)
MCHC: 30.7 g/dL (ref 30.0–36.0)
MCV: 101.3 fL — ABNORMAL HIGH (ref 80.0–100.0)
Platelets: 177 10*3/uL (ref 150–400)
RBC: 2.38 MIL/uL — ABNORMAL LOW (ref 4.22–5.81)
RDW: 16.6 % — ABNORMAL HIGH (ref 11.5–15.5)
WBC: 23.2 10*3/uL — ABNORMAL HIGH (ref 4.0–10.5)
nRBC: 1.2 % — ABNORMAL HIGH (ref 0.0–0.2)

## 2020-05-07 LAB — GLUCOSE, CAPILLARY
Glucose-Capillary: 126 mg/dL — ABNORMAL HIGH (ref 70–99)
Glucose-Capillary: 131 mg/dL — ABNORMAL HIGH (ref 70–99)
Glucose-Capillary: 142 mg/dL — ABNORMAL HIGH (ref 70–99)
Glucose-Capillary: 147 mg/dL — ABNORMAL HIGH (ref 70–99)
Glucose-Capillary: 147 mg/dL — ABNORMAL HIGH (ref 70–99)
Glucose-Capillary: 158 mg/dL — ABNORMAL HIGH (ref 70–99)
Glucose-Capillary: 190 mg/dL — ABNORMAL HIGH (ref 70–99)

## 2020-05-07 LAB — PHOSPHORUS: Phosphorus: 3.4 mg/dL (ref 2.5–4.6)

## 2020-05-07 LAB — DIFFERENTIAL
Abs Immature Granulocytes: 0.95 10*3/uL — ABNORMAL HIGH (ref 0.00–0.07)
Basophils Absolute: 0.1 10*3/uL (ref 0.0–0.1)
Basophils Relative: 1 %
Eosinophils Absolute: 0.6 10*3/uL — ABNORMAL HIGH (ref 0.0–0.5)
Eosinophils Relative: 3 %
Immature Granulocytes: 4 %
Lymphocytes Relative: 7 %
Lymphs Abs: 1.6 10*3/uL (ref 0.7–4.0)
Monocytes Absolute: 1.6 10*3/uL — ABNORMAL HIGH (ref 0.1–1.0)
Monocytes Relative: 7 %
Neutro Abs: 18.2 10*3/uL — ABNORMAL HIGH (ref 1.7–7.7)
Neutrophils Relative %: 78 %
Smear Review: NORMAL

## 2020-05-07 LAB — PREALBUMIN: Prealbumin: <5 mg/dL — ABNORMAL LOW (ref 18–38)

## 2020-05-07 LAB — MAGNESIUM: Magnesium: 1.9 mg/dL (ref 1.7–2.4)

## 2020-05-07 LAB — COMPREHENSIVE METABOLIC PANEL
ALT: 10 U/L (ref 0–44)
AST: 13 U/L — ABNORMAL LOW (ref 15–41)
Albumin: 2.1 g/dL — ABNORMAL LOW (ref 3.5–5.0)
Alkaline Phosphatase: 39 U/L (ref 38–126)
Anion gap: 5 (ref 5–15)
BUN: 17 mg/dL (ref 8–23)
CO2: 24 mmol/L (ref 22–32)
Calcium: 7.5 mg/dL — ABNORMAL LOW (ref 8.9–10.3)
Chloride: 113 mmol/L — ABNORMAL HIGH (ref 98–111)
Creatinine, Ser: 0.68 mg/dL (ref 0.61–1.24)
GFR, Estimated: >60 mL/min (ref >60)
Glucose, Bld: 169 mg/dL — ABNORMAL HIGH (ref 70–99)
Potassium: 3.5 mmol/L (ref 3.5–5.1)
Sodium: 142 mmol/L (ref 135–145)
Total Bilirubin: 1 mg/dL (ref 0.3–1.2)
Total Protein: 4.3 g/dL — ABNORMAL LOW (ref 6.5–8.1)

## 2020-05-07 LAB — TRIGLYCERIDES: Triglycerides: 139 mg/dL (ref <150)

## 2020-05-07 LAB — LACTIC ACID, PLASMA: Lactic Acid, Venous: 1.9 mmol/L (ref 0.5–1.9)

## 2020-05-07 LAB — SURGICAL PATHOLOGY

## 2020-05-07 MED ORDER — ACETAMINOPHEN 325 MG PO TABS: 650.0000 mg | ORAL_TABLET | ORAL | Status: DC | PRN

## 2020-05-07 MED ORDER — VECURONIUM BROMIDE 10 MG IV SOLR
10.0000 mg | Freq: Once | INTRAVENOUS | Status: AC
  Administered 2020-05-07: 10 mg via INTRAVENOUS
  Filled 2020-05-07: qty 10

## 2020-05-07 MED ORDER — ACETAMINOPHEN 650 MG RE SUPP
650.0000 mg | Freq: Four times a day (QID) | RECTAL | Status: DC | PRN
  Administered 2020-05-07: 650 mg via RECTAL
  Filled 2020-05-07: qty 1

## 2020-05-07 MED ORDER — DIGOXIN 0.25 MG/ML IJ SOLN
0.1250 mg | Freq: Every day | INTRAMUSCULAR | Status: DC
  Administered 2020-05-08: 0.125 mg via INTRAVENOUS
  Filled 2020-05-07: qty 2

## 2020-05-07 MED ORDER — DIGOXIN 0.25 MG/ML IJ SOLN
0.5000 mg | Freq: Every day | INTRAMUSCULAR | Status: DC
  Administered 2020-05-07: 0.5 mg via INTRAVENOUS
  Filled 2020-05-07: qty 2

## 2020-05-07 MED ORDER — DIGOXIN 0.25 MG/ML IJ SOLN
0.2500 mg | Freq: Once | INTRAMUSCULAR | Status: AC
  Administered 2020-05-07: 0.25 mg via INTRAVENOUS
  Filled 2020-05-07: qty 2

## 2020-05-07 MED ORDER — SODIUM CHLORIDE 0.9% FLUSH: 10.0000 mL | INTRAVENOUS | Status: DC | PRN

## 2020-05-07 MED ORDER — TRAVASOL 10 % IV SOLN
INTRAVENOUS | Status: AC
  Filled 2020-05-07: qty 1200

## 2020-05-07 MED ORDER — VASOPRESSIN 20 UNITS/100 ML INFUSION FOR SHOCK
0.0000 [IU]/min | INTRAVENOUS | Status: DC
  Administered 2020-05-07: 0.03 [IU]/min via INTRAVENOUS
  Administered 2020-05-08 – 2020-05-09 (×4): 0.04 [IU]/min via INTRAVENOUS
  Filled 2020-05-07 (×6): qty 100

## 2020-05-07 MED ORDER — INSULIN ASPART 100 UNIT/ML ~~LOC~~ SOLN
0.0000 [IU] | SUBCUTANEOUS | Status: DC
  Administered 2020-05-07: 1 [IU] via SUBCUTANEOUS
  Administered 2020-05-07 (×2): 2 [IU] via SUBCUTANEOUS
  Administered 2020-05-07: 1 [IU] via SUBCUTANEOUS
  Administered 2020-05-07: 2 [IU] via SUBCUTANEOUS
  Administered 2020-05-08 (×3): 1 [IU] via SUBCUTANEOUS
  Administered 2020-05-08: 3 [IU] via SUBCUTANEOUS
  Administered 2020-05-08: 1 [IU] via SUBCUTANEOUS
  Administered 2020-05-09: 7 [IU] via SUBCUTANEOUS
  Administered 2020-05-09: 5 [IU] via SUBCUTANEOUS
  Filled 2020-05-07 (×11): qty 1

## 2020-05-07 MED ORDER — MAGNESIUM SULFATE 2 GM/50ML IV SOLN
2.0000 g | Freq: Once | INTRAVENOUS | Status: AC
  Administered 2020-05-07: 2 g via INTRAVENOUS
  Filled 2020-05-07: qty 50

## 2020-05-07 MED ORDER — POTASSIUM CHLORIDE 10 MEQ/50ML IV SOLN
10.0000 meq | INTRAVENOUS | Status: AC
  Administered 2020-05-07 (×4): 10 meq via INTRAVENOUS
  Filled 2020-05-07 (×4): qty 50

## 2020-05-07 MED ORDER — SODIUM CHLORIDE 0.9% FLUSH
10.0000 mL | Freq: Two times a day (BID) | INTRAVENOUS | Status: DC
  Administered 2020-05-07 – 2020-05-08 (×3): 10 mL

## 2020-05-07 NOTE — Progress Notes (Signed)
Progress Note  Patient Name: Brent Diaz Date of Encounter: 05/07/2020  [facility] Cardiologist: No primary care provider on file.   Subjective   He is maintaining sinus rhythm.  Amiodarone drip was discontinued due to concerns about volume of infusions. The patient is intubated and sedated.  Inpatient Medications    Scheduled Meds: . chlorhexidine gluconate (MEDLINE KIT)  15 mL Mouth Rinse BID  . Chlorhexidine Gluconate Cloth  6 each Topical Daily  . [START ON 05/08/2020] digoxin  0.125 mg Intravenous Daily  . docusate  100 mg Per Tube BID  . heparin injection (subcutaneous)  5,000 Units Subcutaneous Q8H  . insulin aspart  0-9 Units Subcutaneous Q4H  . ipratropium-albuterol  3 mL Nebulization Q6H  . mouth rinse  15 mL Mouth Rinse 10 times per day  . mupirocin ointment  1 application Nasal BID  . octreotide  50 mcg Subcutaneous Q8H  . polyethylene glycol  17 g Per Tube Daily   Continuous Infusions: . sodium chloride Stopped (05/07/20 0538)  . dexmedetomidine (PRECEDEX) IV infusion 1.2 mcg/kg/hr (05/07/20 1334)  . fentaNYL infusion INTRAVENOUS 200 mcg/hr (05/07/20 1129)  . pantoprazole (PROTONIX) IV Stopped (05/07/20 1007)  . phenylephrine (NEO-SYNEPHRINE) Adult infusion 230 mcg/min (05/07/20 1129)  . piperacillin-tazobactam (ZOSYN)  IV Stopped (05/07/20 3419)  . TPN ADULT (ION) 100 mL/hr at 05/07/20 1129  . TPN ADULT (ION)    . vancomycin 150 mL/hr at 05/07/20 1129   PRN Meds: acetaminophen **OR** acetaminophen, fentaNYL, ketorolac, midazolam, morphine injection, ondansetron **OR** ondansetron (ZOFRAN) IV, promethazine   Vital Signs    Vitals:   05/07/20 1230 05/07/20 1300 05/07/20 1330 05/07/20 1400  BP: (!) 102/49 (!) 99/51 (!) 93/50 (!) 99/54  Pulse: 76 76 77 79  Resp: 19 19 18 18   Temp:      TempSrc:      SpO2: 91% 92% 91% 92%  Weight:      Height:        Intake/Output Summary (Last 24 hours) at 05/07/2020 1424 Last data filed at 05/07/2020 1129 Gross  per 24 hour  Intake 7385.52 ml  Output 3750 ml  Net 3635.52 ml   Last 3 Weights 05/04/2020 05/02/2020 04/23/2020  Weight (lbs) 165 lb 2 oz 178 lb 12.7 oz 165 lb  Weight (kg) 74.9 kg 81.1 kg 74.844 kg      Telemetry    Normal sinus rhythm rate 90- Personally Reviewed  ECG     - Personally Reviewed  Physical Exam   GEN: No acute distress.   Neck:  Unable to estimate JVD Cardiac: RRR, no murmurs, rubs, or gallops.  Respiratory:  Scattered Rales GI: Soft, nontender, mildly distended MS: No edema; No deformity. Neuro:   Full exam not performed Psych: Lethargic  Labs    High Sensitivity Troponin:   Recent Labs  Lab 05/05/20 0225 05/05/20 0517 05/05/20 0629  TROPONINIHS 536* 489* 474*      Chemistry Recent Labs  Lab 05/06/20 0302 05/06/20 1636 05/07/20 0409  NA 146* 143 142  K 3.3* 3.8 3.5  CL 113* 115* 113*  CO2 22 22 24   GLUCOSE 150* 136* 169*  BUN 14 18 17   CREATININE 0.81 0.80 0.68  CALCIUM 7.7* 8.1* 7.5*  PROT 4.2* 4.3* 4.3*  ALBUMIN 1.7* 2.0* 2.1*  AST 19 16 13*  ALT 11 10 10   ALKPHOS 40 41 39  BILITOT 0.8 1.0 1.0  GFRNONAA >60 >60 >60  ANIONGAP 11 6 5      Hematology Recent  Labs  Lab 05/06/20 0302 05/06/20 1636 05/07/20 0409  WBC 20.0* 20.8* 23.2*  RBC 2.54* 2.51* 2.38*  HGB 7.9* 7.8* 7.4*  HCT 25.3* 24.4* 24.1*  MCV 99.6 97.2 101.3*  MCH 31.1 31.1 31.1  MCHC 31.2 32.0 30.7  RDW 16.2* 16.2* 16.6*  PLT 161 185 177    BNP Recent Labs  Lab 05/04/20 2207  BNP 193.7*     DDimer No results for input(s): DDIMER in the last 168 hours.   Radiology    DG Chest Port 1 View  Result Date: 05/06/2020 CLINICAL DATA:  Intubated EXAM: PORTABLE CHEST 1 VIEW COMPARISON:  05/06/2020, 05/05/2020 FINDINGS: Interval intubation, tip of the endotracheal tube is 4.3 cm superior to carina. Right IJ central venous catheter tip over the SVC. Esophageal tube tip over the proximal stomach. Small left-sided pleural effusion. Dense basilar consolidation on  the left with adjacent ground-glass opacity in the left lung. Right perihilar and basilar mild airspace infiltrates are unchanged. Stable cardiomediastinal silhouette. No pneumothorax. IMPRESSION: 1. Interval intubation with tip of the endotracheal tube 4.3 cm superior to carina. Esophageal tube tip over the proximal stomach. 2. No significant interval change in bilateral airspace disease, left greater than right, and small left pleural effusion since radiograph earlier today but overall worsening as compared with exams several days prior. Electronically Signed   By: Donavan Foil M.D.   On: 05/06/2020 19:39   DG Chest Port 1 View  Result Date: 05/06/2020 CLINICAL DATA:  Lethargy and shortness of breath. EXAM: PORTABLE CHEST 1 VIEW COMPARISON:  05/05/2020 FINDINGS: Enteric tube and right central venous catheter are unchanged in position. Shallow inspiration. Heart size and pulmonary vascularity are normal for technique. Patchy airspace disease in the left mid lung and both lung bases. This is progressing since the previous study. No pneumothorax. IMPRESSION: Patchy airspace disease in the left mid lung and both lung bases progressing since previous study. Electronically Signed   By: Lucienne Capers M.D.   On: 05/06/2020 16:09   DG CHEST PORT 1 VIEW  Result Date: 05/06/2020 CLINICAL DATA:  Tachypnea EXAM: PORTABLE CHEST 1 VIEW COMPARISON:  05/05/2020 FINDINGS: There is progressive left basilar consolidation. Tiny left pleural effusion may be present. Right lung is clear. Nasogastric tube extends into the upper abdomen beyond the margin of the examination. Right internal jugular central venous catheter is unchanged with its tip at this superior vena cava. Cardiac size within normal limits. No acute bone abnormality. IMPRESSION: Progressive left basilar consolidation. Possible tiny associated pleural effusion. Electronically Signed   By: Fidela Salisbury MD   On: 05/06/2020 02:29   ECHOCARDIOGRAM  COMPLETE  Result Date: 05/06/2020    ECHOCARDIOGRAM REPORT   Patient Name:   Brent Diaz Date of Exam: 05/05/2020 Medical Rec #:  [id number]      Height:       70.0 in Accession #:    [id number]     Weight:       165.1 lb Date of Birth:  16-Aug-1955      BSA:          1.924 m Patient Age:    65 years       BP:           118/61 mmHg Patient Gender: M              HR:           113 bpm. Exam Location:  [facility] Procedure: 2D Echo Indications:  Elevated Troponin  History:         Patient has no prior history of Echocardiogram examinations.  Sonographer:     Arville Go RDCS Referring Phys:  [place] Diagnosing Phys: Ida Rogue MD  Sonographer Comments: Technically difficult study due to poor echo windows and no subcostal window. IMPRESSIONS  1. Left ventricular ejection fraction, by estimation, is 55 to 60%. The left ventricle has normal function. The left ventricle has no regional wall motion abnormalities. Left ventricular diastolic parameters are indeterminate.  2. Right ventricular systolic function is normal. The right ventricular size is normal. Tricuspid regurgitation signal is inadequate for assessing PA pressure.  3. Challenging images. FINDINGS  Left Ventricle: Left ventricular ejection fraction, by estimation, is 55 to 60%. The left ventricle has normal function. The left ventricle has no regional wall motion abnormalities. The left ventricular internal cavity size was normal in size. There is  no left ventricular hypertrophy. Left ventricular diastolic parameters are indeterminate. Right Ventricle: The right ventricular size is normal. No increase in right ventricular wall thickness. Right ventricular systolic function is normal. Tricuspid regurgitation signal is inadequate for assessing PA pressure. Left Atrium: Left atrial size was normal in size. Right Atrium: Right atrial size was normal in size. Pericardium: There is no evidence of pericardial effusion. Mitral Valve: The mitral  valve is normal in structure. No evidence of mitral valve regurgitation. No evidence of mitral valve stenosis. Tricuspid Valve: The tricuspid valve is normal in structure. Tricuspid valve regurgitation is mild . No evidence of tricuspid stenosis. Aortic Valve: The aortic valve is normal in structure. Aortic valve regurgitation is not visualized. No aortic stenosis is present. Aortic valve peak gradient measures 7.6 mmHg. Pulmonic Valve: The pulmonic valve was normal in structure. Pulmonic valve regurgitation is not visualized. No evidence of pulmonic stenosis. Aorta: The aortic root is normal in size and structure. Venous: The inferior vena cava is normal in size with greater than 50% respiratory variability, suggesting right atrial pressure of 3 mmHg. IAS/Shunts: No atrial level shunt detected by color flow Doppler.  LEFT VENTRICLE PLAX 2D LVIDd:         3.67 cm     Diastology LVIDs:         2.46 cm     LV e' medial:    7.72 cm/s LV PW:         0.92 cm     LV E/e' medial:  8.7 LV IVS:        1.24 cm     LV e' lateral:   7.83 cm/s LVOT diam:     1.90 cm     LV E/e' lateral: 8.5 LVOT Area:     2.84 cm  LV Volumes (MOD) LV vol d, MOD A4C: 21.4 ml LV SV MOD A4C:     21.4 ml LEFT ATRIUM           Index      RIGHT ATRIUM           Index LA diam:      1.80 cm 0.94 cm/m RA Area:     14.40 cm LA Vol (A4C): 18.2 ml 9.46 ml/m RA Volume:   37.40 ml  19.44 ml/m  AORTIC VALVE              PULMONIC VALVE AV Vmax:      138.00 cm/s PV Vmax:       1.45 m/s AV Peak Grad: 7.6 mmHg    PV  Peak grad:  8.4 mmHg  AORTA Ao Root diam: 3.10 cm Ao Asc diam:  3.30 cm MITRAL VALVE MV Area (PHT): 5.66 cm    SHUNTS MV Decel Time: 134 msec    Systemic Diam: 1.90 cm MV E velocity: 66.80 cm/s MV A velocity: 85.30 cm/s MV E/A ratio:  0.78 Ida Rogue MD Electronically signed by Ida Rogue MD Signature Date/Time: 05/06/2020/1:09:47 PM    Final    [place] EKG SITE RITE  Result Date: 05/06/2020 If Site Rite image not attached, placement could  not be confirmed due to current cardiac rhythm.   Cardiac Studies     Patient Profile     Brent Diaz is a 65 y.o. male with a hx of Crohn's disease, history of PE on Eliquis , prior bowel surgeries, presenting to the hospital with severe abdominal pain, closed-loop proximal jejunal obstruction/internal hernia, chronic and acute diverticulitis, underwent exploratory lap, small bowel resection for proximal jejunal perforation, postop day 2, last night with narrow complex tachycardia concerning for atrial fibrillation, runs of SVT, nonsustained VT  Assessment & Plan     1.  Atrial fibrillation with RVR, paroxysmal Past 36 hours having paroxysmal atrial fibrillation, runs of VT up to 20 beats High risk of arrhythmia given recent GI surgery, associated stressors. -Is maintaining sinus rhythm even after stopping amiodarone drip.  If he develops recurrent atrial arrhythmia, recommend resuming amiodarone drip or using intermittent IV metoprolol for rate control. Agree with no anticoagulation given postsurgical state and continued bloody drainage.  Left lower lobe pneumonia Chest x-ray today with progressive left basilar consolidation  on broad-spectrum antibiotics, Zosyn  Perforated proximal jejunum Status post laparoscopic surgery, resection, lysis of adhesions    Total encounter time more than 25 minutes  Greater than 50% was spent in counseling and coordination of care with the patient     For questions or updates, please contact [facility] Please consult [email or web address] for contact info under        Signed, Kathlyn Sacramento, MD  05/07/2020, 2:24 PM

## 2020-05-07 NOTE — Progress Notes (Signed)
Spoke with Jinny Blossom RN at bedside re PICC.  States Dr Lanney Gins is still wanting to hold off on PICC placement at this time.

## 2020-05-07 NOTE — Progress Notes (Signed)
NAME:  AVIR DERUITER, MRN:  [id number], DOB:  14-Jan-1956, LOS: 4 ADMISSION DATE:  05/01/2020, CONSULTATION DATE:  05/06/2020 REFERRING MD:  Dr. Lysle Pearl, CHIEF COMPLAINT: abdominal pain  Brief History:  65 year old male with Crohn's disease admitted with closed-loop bowel obstruction status post ex lap and small bowel resection for proximal jejunal perforation now with suspected left lower lobe aspiration pneumonia.  History of Present Illness:  65 year old male with significant history of Crohn's disease and previous small bowel resection, presented to the ED with suprapubic abdominal pain.  Patient reported sharp LUQ pain without radiation, associated with nausea and vomiting and exacerbated by touch.  Patient had bloody vomitus while in the ED and reported an episode of dark tarry stools on 05/02/2020.  Patient reported that he is on Eliquis for previous pulmonary embolism.  ED course: CT abdomen showed dilated jejunum and possible internal hernia and/or closed-loop bowel obstruction. Labs: WBC 32.8, lactic 3.8 & Hgb stable at 12.6. Patient was taken emergently to the OR on 04/19/2020.  Postop course complicated by bile leak noted on 05/04/2020, given location of perforation and prior abdominal surgeries in the setting of baseline Crohn's surgery is attempting to avoid more procedures.    EVENTS:  On 05/05/2020 patient developed narrow complex tachycardia overnight, with elevated troponin at 536~474 and complaints of shortness of breath.  CTa negative for PE, but showed new left lower lobe pneumonia.  Lactate increased to 6.8, down to 2.3 after fluid boluses.   On 05/06/2020 patient again developed shortness of breath unable to clear secretions, requiring NT suctioning by respiratory therapy, ultimately requiring rapid intubation and mechanical ventilation.  PCCM consulted for further management and monitoring.  05/08/19- Patient on vasopressor support in septic shock from intraabdominal source of  infection due to perforated diverticulitis.  Palliative consultation is in process.  Stopped IV LR today, dcd albumin and amio gtt to decrease volume of infusions and delivered digoxin load. Will add vasopressin to decrease neosynephrine as it is arrythmogenic and contributing to episodes of tachyarrythmia and AFrVR.    Past Medical History:  Crohn's Pulmonary embolism - 10/2017 Hypertension GERD Cancer -skin Significant Hospital Events:  04/16/2020-admit to stepdown unit after emergent ex lap with small bowel resection for proximal jejunal perforation 05/04/20-bile leak noted, JP drain in place 05/05/20-narrow complex tachycardia, CTA negative for PE, new LLL pneumonia  Consults:  Cardiology Hospitalist  Procedures:  04/17/2020-ex lap and small bowel resection for proximal jejunal perforation 04/12/2020 - CVC 2 L RIJ>> 05/06/20 - ETT >> 05/06/20 - CVC 3 L R femoral >> Significant Diagnostic Tests:  04/16/2020 CT abdomen w contrast > findings suspicious for closed-loop proximal jejunal obstruction due to an internal hernia.  Mild wall thickening involving the jejunum distal to the focally dilated loop of jejunum in the left mid abdomen.  Findings suspicious for chronic and acute diverticulitis involving the proximal and mid sigmoid colon with associated interval irregular adjacent soft tissue mass measuring 5.4 x 4.6 x 3.4 cm.  Small to moderate amount of free peritoneal fluid.  1.2 cm medium density exophytic mass arising from the upper pole of the left kidney.  Mild diffuse hepatic steatosis. 05/04/2020 CTA chest > no pulmonary embolism, dense consolidation of the left lower lobe with extensive endobronchial debris.  Emphysema noted 05/05/2020 CT abdomen wo contrast > postoperative changes in the region of the jejunum, continued inflammation and extraluminal locules of gas adjacent to the sigmoid colon concerning for diverticulitis and contained perforation.  No bowel obstruction 05/05/2020  Echocardiogram > LVEF 55 to 60%, no regional wall motion abnormalities, no LVH.  Mild TR, all else WNL. Micro Data:  04/27/2020 COVID-19 > negative 04/20/2020 influenza A/B > negative 05/05/2020 BC x 2 >> 05/06/2020 Tracheal aspirate >>  Antimicrobials:  04/11/2020 Zosyn >> 05/06/2020 Vancomycin >>  Interim History / Subjective:  Patient emergently intubated requiring mechanical ventilation and sedated due to inability to clear increasing secretions and work of breathing. Wife updated by MD, general surgery attending-Dr. Lysle Pearl aware.  Labs/ Imaging personally reviewed Net: + 1.8 L -currently replacing albumin, receiving IV fluids due to concerns for intravascular dehydration.  Na+/ K+: 143/3.8 BUN/Cr.:  18/0.80 Hgb: 7.8  WBC/ TMAX: 20.8/38.5 Lactic/ PCT: 2.4~2.1/1.04  ABG: 7.29/ 48/ 89/ 23.1 > s/p intubation, ventilator settings adjusted CXR 05/06/2020: Tubes and lines in stable position, no significant interval change in bilateral airspace disease-left greater than right and small left pleural effusion since radiograph earlier today.  Overall worsening as compared with exams several days prior  Objective   Blood pressure (!) 103/56, pulse 73, temperature 98.2 F (36.8 C), temperature source Axillary, resp. rate 20, height 5' 10"  (1.778 m), weight 74.9 kg, SpO2 97 %.    Vent Mode: PRVC FiO2 (%):  [55 %-100 %] 55 % Set Rate:  [18 bmp-20 bmp] 20 bmp Vt Set:  [430 mL-480 mL] 480 mL PEEP:  [5 cmH20] 5 cmH20 Plateau Pressure:  [11 cmH20-16 cmH20] 16 cmH20   Intake/Output Summary (Last 24 hours) at 05/07/2020 0847 Last data filed at 05/07/2020 0998 Gross per 24 hour  Intake 7035.94 ml  Output 3460 ml  Net 3575.94 ml   Filed Weights   04/29/2020 1853 05/04/20 1225  Weight: 81.1 kg 74.9 kg    Examination: General: Adult male, critically ill, lying in bed intubated & sedated requiring mechanical ventilation  HEENT: MM pink/moist, anicteric, atraumatic, neck supple-ruddy  complexion Neuro: Sedated, unable to follow commands, PERRL +3, MAE CV: s1s2 RRR, ST on monitor, no r/m/g Pulm: Regular, non labored on PRVC: 75%/ PEEP- 5, breath sounds rhonchi-BUL & diminished-BLL GI: soft, rounded with covered midline incision and JP drain, bs present GU: foley in place with clear yellow urine Skin: Known abdominal surgical incision, scattered keratosis appearing lesions that are intact, scattered scabbed abrasions and scattered ecchymosis Extremities: warm/dry, pulses + 2 R/P, no edema noted  Resolved Hospital Problem list     Assessment & Plan:  Acute Hypoxic / Hypercapnic Respiratory Failure  Secondary to / in the setting of LLL Pneumonia & Abdominal Surgery Suspected Aspiration Pneumonia CTa on 05/04/2020 showed dense consolidation with endobronchial debris in the left lower lobe, suspected aspiration.  Patient continues to have difficulty clearing secretions requiring NTS, ultimately needing emergent intubation on 05/06/20. - Ventilator settings: PRVC  7 mL/kg, 75% FiO2, 5 PEEP, continue ventilator support & lung protective strategies - Wean PEEP & FiO2 as tolerated, maintain SpO2 > 90%5 - Head of bed elevated 30 degrees, VAP protocol in place - Plateau pressures less than 30 cm H20  - Intermittent chest x-ray & ABG PRN - Daily WUA with SBT as tolerated  - Ensure adequate pulmonary hygiene  - F/u cultures, trend lactic/PCT - PAD protocol in place: continue Fentanyl drip & Precedex with Versed IVP. Avoiding propofol due to TPN administration  Leukocytosis In the setting of aspiration pneumonia and proximal jejunal perforation PMHx: Chronic leukocytosis and follows with hematology outpatient -Monitor daily WBC/fever curve -Continue Zosyn for intra-abdominal coverage & aspiration pneumonia -Vancomycin started 05/06/2020, MRSA PCR positive -  f/u cultures -Trend lactic/PCT  Paroxysmal Atrial Fibrillation  Elevated troponin In the setting of bowel  perforation/surgical repair PMHx: PE CHA2DS2-VASc score: 2 Echocardiogram completed-see above.  Past 36 hours PAF with runs of VT up to 20 beats, rhythm dramatically improved on amiodarone infusion.  Previous BB IVP was ineffective and dropped blood pressure.  Elevated troponin-peak at 536 and down trending, suspect demand ischemia - Continue Amiodarone infusion, cardiology recommendation to rebolused with amiodarone for any additional arrhythmias -Home regimen of Eliquis held at this time due to hematemesis on admission, reports of dark tarry stools & recent abdominal surgery -VTE prophylaxis okayed by general surgery and initiated 05/06/2020 - f/u TSH & thyroid panel - Cardiology consulted, appreciate input - Continuous cardiac monitoring  Perforated bowel -proximal jejunal perforation, with postsurgical leak Upper GI bleed PMHx: Crohn's S/p small bowel resection and JP drain placement for postsurgical leak.  Per general surgery patient has chronically inflamed tissue and previous abdominal surgeries.  This combined with chronic unhealthy bowel tissue in the setting of Crohn's Dr. Lysle Pearl is hoping to avoid another surgery and provide supportive care. 05/06/20-noted decrease of JP output & NGT output.  Dr. Lysle Pearl plans for reimaging 05/07/2020 if patient continues to decline, palliative care consult was also discussed. -Continue octreotide per surgery - Albumin 1.7, replacing with albumin twice daily x 6 doses - LR bolus & IVF, due to concerns of intravascular dehydration -Home regimen will budesonide on hold-Per general surgery  Hypertension -Hypotension in the setting of sedation: Ordered phenylephrine to maintain a MAP > 65.  Discontinuing peripheral Levophed due to recent history of tachyarrhythmias -Home regimen: Lisinopril/hydrochlorothiazide on hold  Goals of care Wife updated by MD regarding emergent intubation and mechanical ventilation.  Due to patient's inability to clear secretions &  general surgery's reluctance for additional abdominal surgeries if needed.  Palliative care consult placed for 05/07/2020 to assist in plan of care discussions  Best practice (evaluated daily)  Diet: NPO Pain/Anxiety/Delirium protocol (if indicated): ketorolac & morphine as needed  VAP protocol (if indicated): N/A DVT prophylaxis: heparin SQ - OK'd by Dr. Lysle Pearl GI prophylaxis: protonix drip Glucose control: monitor Q 6 Mobility: bedrest, mobilize as tolerated Disposition: ICU  Goals of Care:  Last date of multidisciplinary goals of care discussion: 05/06/2020 Family and staff present: Wife updated via telephone by MD Summary of discussion: Discussed plan of care and emergent intubation Follow up goals of care discussion due: 05/07/2020 Code Status: FULL  Labs   CBC: Recent Labs  Lab 05/04/20 2237 05/05/20 0225 05/06/20 0302 05/06/20 1636 05/07/20 0409  WBC 32.2* 28.8* 20.0* 20.8* 23.2*  NEUTROABS  --  24.4*  --  17.2* 18.2*  HGB 9.4* 8.6* 7.9* 7.8* 7.4*  HCT 30.0* 27.4* 25.3* 24.4* 24.1*  MCV 99.7 99.3 99.6 97.2 101.3*  PLT 203 185 161 185 086    Basic Metabolic Panel: Recent Labs  Lab 05/04/20 2237 05/04/20 2310 05/05/20 0225 05/05/20 0629 05/06/20 0302 05/06/20 1636 05/07/20 0409  NA 142  --  143 143 146* 143 142  K 4.0  --  3.5 4.0 3.3* 3.8 3.5  CL 112*  --  115* 111 113* 115* 113*  CO2 15*  --  21* 22 22 22 24   GLUCOSE 156*  --  132* 117* 150* 136* 169*  BUN 17  --  17 16 14 18 17   CREATININE 1.06  --  0.87 0.88 0.81 0.80 0.68  CALCIUM 8.2*  --  8.0* 7.8* 7.7* 8.1* 7.5*  MG  1.9 1.9 1.9  --  1.9  --  1.9  PHOS  --   --  2.8 2.5  --   --  3.4   GFR: Estimated Creatinine Clearance: 96.3 mL/min (by C-G formula based on SCr of 0.68 mg/dL). Recent Labs  Lab 05/04/20 2310 05/05/20 0225 05/05/20 0235 05/05/20 1207 05/06/20 0302 05/06/20 1636 05/06/20 1956 05/07/20 0409  PROCALCITON 0.51 0.46  --   --  1.04  --   --   --   WBC  --  28.8*  --   --  20.0*  20.8*  --  23.2*  LATICACIDVEN 6.8*  --    < > 4.2*  --  2.4* 2.1* 1.9   < > = values in this interval not displayed.    Liver Function Tests: Recent Labs  Lab 04/16/2020 0426 05/05/20 0225 05/05/20 0629 05/06/20 0302 05/06/20 1636 05/07/20 0409  AST 16 14*  --  19 16 13*  ALT 15 11  --  11 10 10   ALKPHOS 56 38  --  40 41 39  BILITOT 0.5 1.0  --  0.8 1.0 1.0  PROT 5.8* 4.4*  --  4.2* 4.3* 4.3*  ALBUMIN 2.7* 2.0* 2.0* 1.7* 2.0* 2.1*   Recent Labs  Lab 04/29/2020 0426  LIPASE 20   No results for input(s): AMMONIA in the last 168 hours.  ABG    Component Value Date/Time   PHART 7.27 (L) 05/07/2020 0500   PCO2ART 50 (H) 05/07/2020 0500   PO2ART 88 05/07/2020 0500   HCO3 23.0 05/07/2020 0500   ACIDBASEDEF 3.8 (H) 05/07/2020 0500   O2SAT 95.3 05/07/2020 0500     Coagulation Profile: No results for input(s): INR, PROTIME in the last 168 hours.  Cardiac Enzymes: No results for input(s): CKTOTAL, CKMB, CKMBINDEX, TROPONINI in the last 168 hours.  HbA1C: Hgb A1c MFr Bld  Date/Time Value Ref Range Status  05/05/2020 02:35 AM 5.5 4.8 - 5.6 % Final    Comment:    (NOTE) Pre diabetes:          5.7%-6.4%  Diabetes:              >6.4%  Glycemic control for   <7.0% adults with diabetes     CBG: Recent Labs  Lab 05/06/20 1805 05/06/20 2007 05/07/20 0032 05/07/20 0408 05/07/20 0724  GLUCAP 130* 169* 190* 147* 158*    Review of Systems: Positives in BOLD   UTA-patient intubated and sedated  Past Medical History:  He,  has a past medical history of Cancer ([facility]), Crohn's disease ([place]), GERD (gastroesophageal reflux disease), Hypertension, MRSA (methicillin resistant staph aureus) culture positive, Neutrophilia, Pulmonary embolism ([place]) (10/2017), Shingles, and Wears contact lenses.   Surgical History:   Past Surgical History:  Procedure Laterality Date  . ABDOMINAL SURGERY    . BOWEL RESECTION     x 3  . CATARACT EXTRACTION W/PHACO Left 10/18/2018    Procedure: CATARACT EXTRACTION PHACO AND INTRAOCULAR LENS PLACEMENT ([place]) LEFT;  Surgeon: Eulogio Bear, MD;  Location: [facility];  Service: Ophthalmology;  Laterality: Left;  . CATARACT EXTRACTION W/PHACO Right 12/06/2018   Procedure: CATARACT EXTRACTION PHACO AND INTRAOCULAR LENS PLACEMENT ([facility]) RIGHT;  Surgeon: Eulogio Bear, MD;  Location: [facility];  Service: Ophthalmology;  Laterality: Right;  . HYDROCELE EXCISION / REPAIR    . IMAGE GUIDED SINUS SURGERY N/A 05/21/2017   Procedure: IMAGE GUIDED SINUS SURGERY;  Surgeon: Margaretha Sheffield, MD;  Location: [facility];  Service: ENT;  Laterality: N/A;  need stryker disk  . LAPAROTOMY N/A 04/04/2020   Procedure: EXPLORATORY LAPAROTOMY;  Surgeon: Benjamine Sprague, DO;  Location: [facility];  Service: General;  Laterality: N/A;  . MAXILLARY ANTROSTOMY Right 05/21/2017   Procedure: MAXILLARY ANTROSTOMY;  Surgeon: Margaretha Sheffield, MD;  Location: [facility];  Service: ENT;  Laterality: Right;  . SEPTOPLASTY N/A 05/21/2017   Procedure: SEPTOPLASTY;  Surgeon: Margaretha Sheffield, MD;  Location: [facility];  Service: ENT;  Laterality: N/A;     Social History:   reports that he has been smoking cigarettes. He has a 23.50 pack-year smoking history. He has never used smokeless tobacco. He reports that he does not drink alcohol and does not use drugs.   Family History:  His family history includes Aneurysm (age of onset: 81) in his father; Dementia (age of onset: 59) in his mother.   Allergies Allergies  Allergen Reactions  . Amlodipine Swelling    feet  . Aspirin Other (See Comments)    Peptic ulcers  . Flagyl [Metronidazole]     Gives neuropathy if taken too long      Home Medications  Prior to Admission medications   Medication Sig Start Date End Date Taking? Authorizing Provider  apixaban (ELIQUIS) 5 MG TABS tablet Take 1 tablet (5 mg total) by mouth 2 (two) times daily. Start 6/19 10/21/17   Bettey Costa,  MD  B Complex-Folic Acid (B COMPLEX-VITAMIN B12 PO) Take 1,000 mcg by mouth daily.     [provider]  budesonide (ENTOCORT EC) 3 MG 24 hr capsule Take 6 mg by mouth daily.     [provider]  butalbital-acetaminophen-caffeine (FIORICET) 50-325-40 MG tablet Take 1 tablet by mouth every 4 (four) hours as needed for headache.    [provider]  fluorouracil (EFUDEX) 5 % cream Apply topically. 09/19/19 09/18/20  [provider]  folic acid (FOLVITE) 1 MG tablet Take 1 mg by mouth daily. 06/21/16   [provider]  hyoscyamine (LEVSIN SL) 0.125 MG SL tablet Place 0.125 mg under the tongue every 4 (four) hours as needed.    [provider]  LISINOPRIL-HYDROCHLOROTHIAZIDE PO Take 1 tablet by mouth daily. 5 mg daily    [provider]  loperamide (IMODIUM) 2 MG capsule Take 2 mg by mouth as needed for diarrhea or loose stools.    [provider]  Melatonin 5 MG TABS Take 10 mg by mouth at bedtime as needed.    [provider]  montelukast (SINGULAIR) 10 MG tablet Take 10 mg by mouth every morning.     [provider]  nystatin (MYCOSTATIN) 100000 UNIT/ML suspension Swish and swallow 5 mLs 4 (four) times daily 12/22/19   [provider]  ondansetron (ZOFRAN ODT) 4 MG disintegrating tablet Take 1 tablet (4 mg total) by mouth every 8 (eight) hours as needed for nausea or vomiting. 04/27/18   Darel Hong, MD  pantoprazole (PROTONIX) 40 MG tablet Take 40 mg by mouth 2 (two) times daily. 04/14/16   [provider]  potassium chloride SA (K-DUR,KLOR-CON) 20 MEQ tablet Take 1 tablet by mouth daily.  10/08/17 04/11/20  [provider]  vitamin B-12 (CYANOCOBALAMIN) 1000 MCG tablet Take 1,000 mcg by mouth daily.    [provider]  Vitamin D, Ergocalciferol, (DRISDOL) 50000 units CAPS capsule Take 50,000 Units by mouth every 7 (seven) days.    [provider]  zolpidem (AMBIEN) 10 MG  tablet Take 10 mg  by mouth at bedtime as needed for sleep. Patient not taking: Reported on 03/27/2020    [provider]     Critical care time: 32 minutes    Critical care provider statement:    Critical care time (minutes):  32   Critical care time was exclusive of:  Separately billable procedures and  treating other patients   Critical care was necessary to treat or prevent imminent or  life-threatening deterioration of the following conditions:  septic shock due to intraabdominal infection, multiple comorbid conditions.    Critical care was time spent personally by me on the following  activities:  Development of treatment plan with patient or surrogate,  discussions with consultants, evaluation of patient's response to  treatment, examination of patient, obtaining history from patient or  surrogate, ordering and performing treatments and interventions, ordering  and review of laboratory studies and re-evaluation of patient's condition   I assumed direction of critical care for this patient from another  provider in my specialty: no     Ottie Glazier, M.D.  [facility] [place]

## 2020-05-07 NOTE — Progress Notes (Signed)
Pt went into SVT with a HR of 150 at this time and pt remains in afib now with a rate of 80 to 140. O2 dropped to 84%. RT called to bedside. Pt is now on 100% FIO2. Pt's SBP in the 70s and neo gtt increased to 300 mcg. Pt seems to be working more to breathe but is maxed out on current sedation. Dr. Lanney Gins notified of events.

## 2020-05-07 NOTE — Progress Notes (Signed)
Brief visit this AM while rounding on ICU; pt.'s wife Hassan Rowan at bedside.  She shared pt. is 'not doing well' --> was intubated w/her permission last night.  Wife says she has limited support as she seeks to be present w/pt. at hospital and also care for their son w/special needs.  [place] paged to another room; when [facility] returned wife had gone home.  [place] remains available.

## 2020-05-07 NOTE — Consult Note (Signed)
PHARMACY - TOTAL PARENTERAL NUTRITION CONSULT NOTE   Indication: Fistula  Patient Measurements: Height: 5' 10"  (177.8 cm) Weight: 74.9 kg (165 lb 2 oz) IBW/kg (Calculated) : 73 TPN AdjBW (KG): 81.1 Body mass index is 23.69 kg/m.  Assessment:  Patient is a 65 y/o M with medical history including Crohn's disease, history of PE, GERD who presented to the ED 12/30 with severe abdominal pain. Patient went for emergent exploratory laparotomy with small bowel resection and lysis of adhesions. 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   Glucose / Insulin: No apparent history of diabetes. BG 150 - 190 last 24h, 8 units insulin urequired  Electrolytes: WNL Renal: Scr < 1, stable LFTs / TGs: Transaminases within normal limits. TG ordered for tomorrow AM. Prealbumin / albumin: Albumin 2.7-->2.1, prealbumin <5 mg/dL Intake / Output; MIVF: Net + 2.3L this admission.  GI Imaging: 12/30 CT Abdomen Pelvis:   Suspicious for a closed loop proximal jejunal obstruction due to an internal hernia.  Suspicious for chronic and acute diverticulitis involving the proximal to mid sigmoid colon with associated interval irregular adjacent soft tissue mass measuring 5.4 x 4.6 x 3.4 cm.  Small to moderate amount of free peritoneal fluid. 01/02 CXR: No significant interval change in bilateral airspace disease, left greater than right, and small left pleural effusion since radiograph earlier today but overall worsening as compared with exams several days prior Surgeries / Procedures:  12/30 Exploratory laparotomy with small bowel resection and lysis of adhesions  Central access: 12/30 (CVC Double Lumen 04/27/2020 Right Internal jugular) TPN start date: 12/31   Nutritional Goals (per RD recommendation on 12/31): kCal: 2100 - 2300, Protein: 110-120g, Fluid: 2.2 - 2.6L Goal TPN rate is 100 mL/hr (provides 120 g of protein and 2227 kcals per day)  Current Nutrition:  NPO  Plan:   Continue  TPN at goal rate of 100 mL/hr  Nutritional Components:  Fluid: 2500 mL  Protein: 120 g  Dextrose: 408 g  Lipids: 36 g  Kcal: 2227 / day  Electrolytes in TPN: 56mq/L of Na, 566m/L of K, 45m245mL of Ca, 45mE16m of Mg, and 145mm39m of Phos. Continue Cl:Ac ratio to 1:2  Add standard MVI and trace elements to TPN, add thiamine for re-feed risk  10 mEq IV KCl x 4, 2 grams IV magnesium sulfate x 1  Continue Sensitive q4h SSI and adjust as needed   stop MIVF   Monitor TPN labs on Mon/Thurs, daily until stable  RodneDallie PilesrmD, BCPS Clinical Pharmacist 05/07/2020 9:54 AM

## 2020-05-07 NOTE — Progress Notes (Signed)
Brief progress note.    [facility] hospitalist service was consulted by primary surgical team 12/31  For rapid atrial fibrillation.  Subsequently cardiology was consulted with recommendations for amio bolus and gtt.  On 1/2 patient was noted by RN to be altered, tachypneic, febrile.  Case d/w PCCM attending.  Likely etiology 2/2 intra-abdominal surgical issues.    Patient was emergently intubated by PCCM.  Level of care transferred to ICU.  [facility] hospitalist service to sign off at this time.  Please re-consult as needed.  Ralene Muskrat MD  No charge

## 2020-05-07 NOTE — Consult Note (Signed)
Pharmacy Antibiotic Note  Brent Diaz is a 65 y.o. male admitted on 04/17/2020 with Peritonitis. Pt presented via EMS with severe abdominal pain associated with hematemesis and hematochezia. PMH include Crohn's, bowel resection, GERD, HTN, and PE in 2019. Pt underwent emergent surgery for closed-loop bowel obstruction following reversal of apixaban on 12/30. Pharmacy was consulted for vancomycin and Zosyn dosing. Since admission his renal function has been stable  Plan:  1) continue Zosyn 3.375g IV q8h (4 hour infusion).  2) continue vancomycin 750 mg IV every 8 hours  Ke: 0.084 h-1, T1/2: 8.1 h  Css (calcluated): 28.3 / 15.7 mcg/mL  Levels as clinically indicated  Daily renal function assessment while on vancomycin  Temp (24hrs), Avg:100.1 F (37.8 C), Min:98.2 F (36.8 C), Max:101.4 F (38.6 C)  Recent Labs  Lab 05/04/20 2237 05/04/20 2310 05/05/20 0225 05/05/20 0235 05/05/20 0629 05/05/20 0957 05/05/20 1207 05/06/20 0302 05/06/20 1636 05/06/20 1956 05/07/20 0409  WBC 32.2*  --  28.8*  --   --   --   --  20.0* 20.8*  --  23.2*  CREATININE 1.06  --  0.87  --  0.88  --   --  0.81 0.80  --  0.68  LATICACIDVEN  --    < >  --    < > 3.7* 4.7* 4.2*  --  2.4* 2.1* 1.9   < > = values in this interval not displayed.    Estimated Creatinine Clearance: 96.3 mL/min (by C-G formula based on SCr of 0.68 mg/dL).    Antimicrobials this admission: 12/30 Zosyn >> 01/02 vancomycin >>  Microbiology results: 01/02 tracheal asp: pending 12/30 resp. Panel: negative 01/02 MRSA PCR: (+) 01/01 BCx: NG x 3 days  Thank you for allowing pharmacy to be a part of this patient's care.  Dallie Piles, PharmD 05/07/2020 1:59 PM

## 2020-05-07 NOTE — Progress Notes (Signed)
Nutrition Follow Up Note   DOCUMENTATION CODES:   Non-severe (moderate) malnutrition in context of chronic illness  INTERVENTION:   Continue TPN per pharmacy  Daily weights  NUTRITION DIAGNOSIS:   Moderate Malnutrition related to chronic illness (Crohn's disease) as evidenced by moderate fat depletion,moderate muscle depletion,severe muscle depletion. -ongoing   GOAL:   Provide needs based on ASPEN/SCCM guidelines  -met with TPN  MONITOR:   Vent status,Labs,Weight trends,Skin,I & O's, TPN  ASSESSMENT:   65 year old male with PMHx of HTN, Crohn's disease, GERD, shingles, hx bowel resection x 3 admitted with closed-loop bowel obstruction with perforated bowel s/p exploratory laparotomy, small bowel resection, and lysis of adhesions on 12/30. Pt noted to have new LLL aspiration PNA and Afib.   -Pt s/p central venous cath 1/2 -Vancomycin started 05/06/2020, MRSA PCR positive  Pt intubated and ventilated 1/2. NGT in place with 600ml output. JP drain in place with 25ml output. Pt tolerating TPN well at goal rate. PICC line pending. Palliative care consult pending.    No new weight since 12/31; will order daily weights  Medications reviewed and include: colace, heparin, insulin, octreotide, miralax, albumin, precedex, fentanyl, LRS @100ml/hr, protonix, phenylephrine, zosyn, KCl, vancomycin  Labs reviewed: K 3.5 wnl, P 3.4 wnl, Mg 1.9 wnl Wbc- 23.2(H), Hgb 7.4(L), Hct 24.1(L) cbgs- 190, 147, 158 x 24 hrs  Patient is currently intubated on ventilator support MV: 10.0 L/min Temp (24hrs), Avg:100.2 F (37.9 C), Min:98.2 F (36.8 C), Max:101.4 F (38.6 C)  Propofol: none   MAP- >65mmHg  UOP- 2775ml  Diet Order:   Diet Order            Diet NPO time specified  Diet effective now                EDUCATION NEEDS:   No education needs have been identified at this time  Skin:  Skin Assessment: Skin Integrity Issues: Skin Integrity Issues:: Incisions Incisions:  closed incision to abdomen  Last BM:  Unknown  Height:   Ht Readings from Last 1 Encounters:  04/16/2020 5' 10" (1.778 m)   Weight:   Wt Readings from Last 1 Encounters:  05/04/20 74.9 kg   Ideal Body Weight:  75.5 kg  BMI:  Body mass index is 23.69 kg/m.  Estimated Nutritional Needs:   Kcal:  2032kcal/day  Protein:  110-120 grams  Fluid:  2.2-2.6 L/day    MS, RD, LDN Please refer to AMION for RD and/or RD on-call/weekend/after hours pager  

## 2020-05-07 NOTE — Progress Notes (Signed)
Dr. Lanney Gins at bedside assessing patient. .81m digoxin ordered and he wants to add vasopressin gtt and titrate neo gtt down. He is aware pt is now on 100% FIO2. He has assessed pt's breathing and is okay with this.

## 2020-05-07 NOTE — Progress Notes (Signed)
Assessed pt BUE for PIV insertion.  PICC remains ordered, but on hold per Dr Lanney Gins due to attempting to d/c drips.  Skin BUE weeping and with dressings due to multiple skin tears. Due to skin status, anterior aspect of LUE noted on ultrasound with brachial vein at 0.5 cm depth.  Due to multiple meds inappropriate for midline usage, 20 G 1.88 " catheter inserted without difficulty.  Dressing applied to intact skin.  RN notified.  States still waiting on PICC placement.  RUE salvaged for PICC when instructed to proceed. Megan RN aware.

## 2020-05-07 NOTE — Progress Notes (Signed)
Attempted to call pt's wife to obtain consent for PICC line placement. No answer at this time.

## 2020-05-07 NOTE — Progress Notes (Signed)
Subjective:  CC: Brent Diaz is a 65 y.o. male  Hospital stay day 4, 4 Days Post-Op ex-lap, small bowel resection   HPI: Intubated and sedated.  ROS:  Unable to obtain secondary to patient status   Objective:   Temp:  [98.2 F (36.8 C)-101.4 F (38.6 C)] 98.4 F (36.9 C) (01/03 1200) Pulse Rate:  [72-122] 76 (01/03 1230) Resp:  [10-41] 19 (01/03 1230) BP: (68-156)/(35-131) 102/49 (01/03 1230) SpO2:  [87 %-100 %] 91 % (01/03 1230) FiO2 (%):  [55 %-100 %] 55 % (01/03 1200)     Height: 5' 10"  (177.8 cm) Weight: 74.9 kg BMI (Calculated): 23.69   Intake/Output this shift:   Intake/Output Summary (Last 24 hours) at 05/07/2020 1309 Last data filed at 05/07/2020 1129 Gross per 24 hour  Intake 7385.52 ml  Output 3750 ml  Net 3635.52 ml    Constitutional :  intubated and sedated  Respiratory:  clear to auscultation bilaterally  Cardiovascular:  irregularly irregular rhythm  Gastrointestinal: soft, no guarding, sedated.  midline wound with serous drainage, scant purulent drainage, no worsenign erythema or induration.  JP with minimal bilious output. NG with dark bilious, old bloody output   Skin: Cool and moist.   Psychiatric: Normal affect, non-agitated, not confused       LABS:  CMP Latest Ref Rng & Units 05/07/2020 05/06/2020 05/06/2020  Glucose 70 - 99 mg/dL 169(H) 136(H) 150(H)  BUN 8 - 23 mg/dL 17 18 14   Creatinine 0.61 - 1.24 mg/dL 0.68 0.80 0.81  Sodium 135 - 145 mmol/L 142 143 146(H)  Potassium 3.5 - 5.1 mmol/L 3.5 3.8 3.3(L)  Chloride 98 - 111 mmol/L 113(H) 115(H) 113(H)  CO2 22 - 32 mmol/L 24 22 22   Calcium 8.9 - 10.3 mg/dL 7.5(L) 8.1(L) 7.7(L)  Total Protein 6.5 - 8.1 g/dL 4.3(L) 4.3(L) 4.2(L)  Total Bilirubin 0.3 - 1.2 mg/dL 1.0 1.0 0.8  Alkaline Phos 38 - 126 U/L 39 41 40  AST 15 - 41 U/L 13(L) 16 19  ALT 0 - 44 U/L 10 10 11    CBC Latest Ref Rng & Units 05/07/2020 05/06/2020 05/06/2020  WBC 4.0 - 10.5 K/uL 23.2(H) 20.8(H) 20.0(H)  Hemoglobin 13.0 - 17.0 g/dL 7.4(L)  7.8(L) 7.9(L)  Hematocrit 39.0 - 52.0 % 24.1(L) 24.4(L) 25.3(L)  Platelets 150 - 400 K/uL 177 185 161    RADS: n/a Assessment:   S/p ex-lap, proximal jejunal resection and re-anastamosis for perforted 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  Unfortunately already leaking bile, likely due to the chronically inflammed tissue.  Slowly decompensating, requiring intubation and pressor support now.   Continue with TPN, strict NPO, NG decompression, JP drain to control leak, octreotide in hopes of slowing output from surgery standpoint.  JP output has dropped significantly, and t.bili levels remain wnl past couple days, and abdomen with no involuntary guarding.  Hope is output has slowed, or leak has stopped.    Will continue foley for strict I&O, IV abx, lyte replacements.  Hx of PE, on eliquis.  Will hold anticoagulation for now since NG output still looks like old blood.  hgb is slowly downtrending, continue to monitor.  Will continue IV protonix in the meantime.    Chronic leukocytosis-  Increasing today, continue to monitor.  Wife updated at bedside of guarded condition.  Recommended palliative care discussion at this time.  All questions addressed.  Appreciate all consult care and recs

## 2020-05-08 DIAGNOSIS — K631 Perforation of intestine (nontraumatic): Secondary | ICD-10-CM | POA: Diagnosis not present

## 2020-05-08 DIAGNOSIS — Z7189 Other specified counseling: Secondary | ICD-10-CM | POA: Diagnosis not present

## 2020-05-08 DIAGNOSIS — A419 Sepsis, unspecified organism: Secondary | ICD-10-CM

## 2020-05-08 DIAGNOSIS — Z515 Encounter for palliative care: Secondary | ICD-10-CM

## 2020-05-08 DIAGNOSIS — J690 Pneumonitis due to inhalation of food and vomit: Secondary | ICD-10-CM | POA: Diagnosis not present

## 2020-05-08 DIAGNOSIS — R6521 Severe sepsis with septic shock: Secondary | ICD-10-CM

## 2020-05-08 DIAGNOSIS — I480 Paroxysmal atrial fibrillation: Secondary | ICD-10-CM | POA: Diagnosis not present

## 2020-05-08 DIAGNOSIS — J9601 Acute respiratory failure with hypoxia: Secondary | ICD-10-CM | POA: Diagnosis not present

## 2020-05-08 DIAGNOSIS — E875 Hyperkalemia: Secondary | ICD-10-CM

## 2020-05-08 DIAGNOSIS — N179 Acute kidney failure, unspecified: Secondary | ICD-10-CM | POA: Diagnosis not present

## 2020-05-08 DIAGNOSIS — D72819 Decreased white blood cell count, unspecified: Secondary | ICD-10-CM

## 2020-05-08 DIAGNOSIS — B999 Unspecified infectious disease: Secondary | ICD-10-CM

## 2020-05-08 LAB — GLUCOSE, CAPILLARY
Glucose-Capillary: 110 mg/dL — ABNORMAL HIGH (ref 70–99)
Glucose-Capillary: 131 mg/dL — ABNORMAL HIGH (ref 70–99)
Glucose-Capillary: 136 mg/dL — ABNORMAL HIGH (ref 70–99)
Glucose-Capillary: 137 mg/dL — ABNORMAL HIGH (ref 70–99)
Glucose-Capillary: 139 mg/dL — ABNORMAL HIGH (ref 70–99)
Glucose-Capillary: 214 mg/dL — ABNORMAL HIGH (ref 70–99)

## 2020-05-08 LAB — POTASSIUM
Potassium: 5.7 mmol/L — ABNORMAL HIGH (ref 3.5–5.1)
Potassium: 6.2 mmol/L — ABNORMAL HIGH (ref 3.5–5.1)
Potassium: 7 mmol/L (ref 3.5–5.1)

## 2020-05-08 LAB — HEPATIC FUNCTION PANEL
ALT: 9 U/L (ref 0–44)
AST: 17 U/L (ref 15–41)
Albumin: 1.5 g/dL — ABNORMAL LOW (ref 3.5–5.0)
Alkaline Phosphatase: 52 U/L (ref 38–126)
Bilirubin, Direct: 0.6 mg/dL — ABNORMAL HIGH (ref 0.0–0.2)
Indirect Bilirubin: 0.5 mg/dL (ref 0.3–0.9)
Total Bilirubin: 1.1 mg/dL (ref 0.3–1.2)
Total Protein: 3.9 g/dL — ABNORMAL LOW (ref 6.5–8.1)

## 2020-05-08 LAB — RENAL FUNCTION PANEL
Albumin: 1.5 g/dL — ABNORMAL LOW (ref 3.5–5.0)
Anion gap: 5 (ref 5–15)
BUN: 30 mg/dL — ABNORMAL HIGH (ref 8–23)
CO2: 23 mmol/L (ref 22–32)
Calcium: 7.8 mg/dL — ABNORMAL LOW (ref 8.9–10.3)
Chloride: 113 mmol/L — ABNORMAL HIGH (ref 98–111)
Creatinine, Ser: 1.47 mg/dL — ABNORMAL HIGH (ref 0.61–1.24)
GFR, Estimated: 53 mL/min — ABNORMAL LOW (ref >60)
Glucose, Bld: 162 mg/dL — ABNORMAL HIGH (ref 70–99)
Phosphorus: 4 mg/dL (ref 2.5–4.6)
Potassium: 5.4 mmol/L — ABNORMAL HIGH (ref 3.5–5.1)
Sodium: 141 mmol/L (ref 135–145)

## 2020-05-08 LAB — CBC WITH DIFFERENTIAL/PLATELET
Abs Immature Granulocytes: 2.13 10*3/uL — ABNORMAL HIGH (ref 0.00–0.07)
Basophils Absolute: 0.2 10*3/uL — ABNORMAL HIGH (ref 0.0–0.1)
Basophils Relative: 1 %
Eosinophils Absolute: 0.8 10*3/uL — ABNORMAL HIGH (ref 0.0–0.5)
Eosinophils Relative: 3 %
HCT: 27.5 % — ABNORMAL LOW (ref 39.0–52.0)
Hemoglobin: 8.1 g/dL — ABNORMAL LOW (ref 13.0–17.0)
Immature Granulocytes: 7 %
Lymphocytes Relative: 7 %
Lymphs Abs: 2.1 10*3/uL (ref 0.7–4.0)
MCH: 30.8 pg (ref 26.0–34.0)
MCHC: 29.5 g/dL — ABNORMAL LOW (ref 30.0–36.0)
MCV: 104.6 fL — ABNORMAL HIGH (ref 80.0–100.0)
Monocytes Absolute: 2.8 10*3/uL — ABNORMAL HIGH (ref 0.1–1.0)
Monocytes Relative: 9 %
Neutro Abs: 23.7 10*3/uL — ABNORMAL HIGH (ref 1.7–7.7)
Neutrophils Relative %: 73 %
Platelets: 191 10*3/uL (ref 150–400)
RBC: 2.63 MIL/uL — ABNORMAL LOW (ref 4.22–5.81)
RDW: 16.9 % — ABNORMAL HIGH (ref 11.5–15.5)
Smear Review: NORMAL
WBC: 31.8 10*3/uL — ABNORMAL HIGH (ref 4.0–10.5)
nRBC: 1.9 % — ABNORMAL HIGH (ref 0.0–0.2)

## 2020-05-08 LAB — THYROID PANEL WITH TSH
Free Thyroxine Index: 1 — ABNORMAL LOW (ref 1.2–4.9)
T3 Uptake Ratio: 32 % (ref 24–39)
T4, Total: 3 ug/dL — ABNORMAL LOW (ref 4.5–12.0)
TSH: 0.048 u[IU]/mL — ABNORMAL LOW (ref 0.450–4.500)

## 2020-05-08 LAB — T4, FREE: Free T4: 0.75 ng/dL (ref 0.61–1.12)

## 2020-05-08 LAB — DIGOXIN LEVEL: Digoxin Level: 3.2 ng/mL (ref 0.8–2.0)

## 2020-05-08 LAB — MAGNESIUM: Magnesium: 2.1 mg/dL (ref 1.7–2.4)

## 2020-05-08 MED ORDER — SODIUM CHLORIDE 0.9 % IV SOLN
0.1000 mg/kg/h | INTRAVENOUS | Status: DC
  Administered 2020-05-08: 0.1 mg/kg/h via INTRAVENOUS
  Filled 2020-05-08 (×2): qty 10
  Filled 2020-05-08: qty 5
  Filled 2020-05-08: qty 10
  Filled 2020-05-08: qty 5
  Filled 2020-05-08: qty 10
  Filled 2020-05-08: qty 5
  Filled 2020-05-08: qty 10

## 2020-05-08 MED ORDER — DEXTROSE 50 % IV SOLN
1.0000 | Freq: Once | INTRAVENOUS | Status: AC
  Administered 2020-05-08: 50 mL via INTRAVENOUS
  Filled 2020-05-08: qty 50

## 2020-05-08 MED ORDER — INSULIN ASPART 100 UNIT/ML ~~LOC~~ SOLN
10.0000 [IU] | Freq: Once | SUBCUTANEOUS | Status: AC
  Administered 2020-05-08: 10 [IU] via INTRAVENOUS
  Filled 2020-05-08: qty 1
  Filled 2020-05-08 (×2): qty 0.1

## 2020-05-08 MED ORDER — DEXTROSE 5 % IV SOLN: INTRAVENOUS | Status: AC

## 2020-05-08 MED ORDER — VANCOMYCIN HCL 1500 MG/300ML IV SOLN
1500.0000 mg | INTRAVENOUS | Status: DC
  Administered 2020-05-08: 1500 mg via INTRAVENOUS
  Filled 2020-05-08 (×2): qty 300

## 2020-05-08 MED ORDER — SODIUM BICARBONATE 8.4 % IV SOLN
50.0000 meq | Freq: Once | INTRAVENOUS | Status: AC
  Administered 2020-05-08: 50 meq via INTRAVENOUS
  Filled 2020-05-08: qty 50

## 2020-05-08 MED ORDER — LACTATED RINGERS IV BOLUS: 1000.0000 mL | Freq: Once | INTRAVENOUS | Status: DC

## 2020-05-08 MED ORDER — CALCIUM GLUCONATE-NACL 1-0.675 GM/50ML-% IV SOLN
1.0000 g | Freq: Once | INTRAVENOUS | Status: AC
  Administered 2020-05-08: 1000 mg via INTRAVENOUS
  Filled 2020-05-08: qty 50

## 2020-05-08 MED ORDER — INSULIN ASPART 100 UNIT/ML IV SOLN
10.0000 [IU] | Freq: Once | INTRAVENOUS | Status: AC
  Administered 2020-05-08: 10 [IU] via INTRAVENOUS
  Filled 2020-05-08: qty 0.1

## 2020-05-08 MED ORDER — HYDROCORTISONE NA SUCCINATE PF 100 MG IJ SOLR
50.0000 mg | Freq: Four times a day (QID) | INTRAMUSCULAR | Status: DC
  Administered 2020-05-08 – 2020-05-09 (×4): 50 mg via INTRAVENOUS
  Filled 2020-05-08 (×4): qty 2

## 2020-05-08 MED ORDER — INSULIN ASPART 100 UNIT/ML ~~LOC~~ SOLN: 10.0000 [IU] | Freq: Once | SUBCUTANEOUS | Status: DC

## 2020-05-08 MED ORDER — TRAVASOL 10 % IV SOLN
INTRAVENOUS | Status: DC
  Filled 2020-05-08: qty 1200

## 2020-05-08 NOTE — Progress Notes (Signed)
Progress Note  Patient Name: Brent Diaz Date of Encounter: 05/08/2020  [facility] Cardiologist: Rockey Situ  Subjective   Intubated and sedated with labile BP's overnight.  Patient currently on multiple vasopressors.  Inpatient Medications    Scheduled Meds: . chlorhexidine gluconate (MEDLINE KIT)  15 mL Mouth Rinse BID  . Chlorhexidine Gluconate Cloth  6 each Topical Daily  . dextrose  1 ampule Intravenous Once  . digoxin  0.125 mg Intravenous Daily  . docusate  100 mg Per Tube BID  . heparin injection (subcutaneous)  5,000 Units Subcutaneous Q8H  . hydrocortisone sod succinate (SOLU-CORTEF) inj  50 mg Intravenous Q6H  . insulin aspart  0-9 Units Subcutaneous Q4H  . insulin aspart  10 Units Intravenous Once  . ipratropium-albuterol  3 mL Nebulization Q6H  . mouth rinse  15 mL Mouth Rinse 10 times per day  . mupirocin ointment  1 application Nasal BID  . octreotide  50 mcg Subcutaneous Q8H  . polyethylene glycol  17 g Per Tube Daily  . sodium chloride flush  10-40 mL Intracatheter Q12H   Continuous Infusions: . sodium chloride Stopped (05/07/20 0538)  . dexmedetomidine (PRECEDEX) IV infusion 1.2 mcg/kg/hr (05/08/20 0602)  . fentaNYL infusion INTRAVENOUS 200 mcg/hr (05/08/20 0242)  . ketamine (KETALAR) Adult IV Infusion    . lactated ringers    . pantoprazole (PROTONIX) IV 80 mg (05/07/20 2241)  . phenylephrine (NEO-SYNEPHRINE) Adult infusion 400 mcg/min (05/08/20 0854)  . piperacillin-tazobactam (ZOSYN)  IV 3.375 g (05/08/20 0506)  . TPN ADULT (ION) 100 mL/hr at 05/07/20 1811  . vancomycin    . vasopressin 0.04 Units/min (05/08/20 1016)   PRN Meds: acetaminophen **OR** acetaminophen, fentaNYL, midazolam, morphine injection, ondansetron **OR** ondansetron (ZOFRAN) IV, promethazine, sodium chloride flush   Vital Signs    Vitals:   05/08/20 0540 05/08/20 0550 05/08/20 0600 05/08/20 0610  BP: (!) 88/50 (!) 93/52 (!) 90/54 (!) 91/52  Pulse: 82 81 82 79  Resp: (!)  21 19 20 20   Temp:      TempSrc:      SpO2: 100% 100% 96% 99%  Weight:      Height:        Intake/Output Summary (Last 24 hours) at 05/08/2020 1107 Last data filed at 05/08/2020 1016 Gross per 24 hour  Intake 1885.92 ml  Output 1490 ml  Net 395.92 ml   Last 3 Weights 05/08/2020 05/04/2020 04/08/2020  Weight (lbs) 184 lb 1.4 oz 165 lb 2 oz 178 lb 12.7 oz  Weight (kg) 83.5 kg 74.9 kg 81.1 kg      Telemetry    Currently sinus rhythm with PAC's.  Patient converted from a-fib to SR yesterday afternoon around 5:30. - Personally Reviewed  ECG    No new tracing.  Physical Exam   GEN: Intubated and sedated. Neck: Unable to assess JVP due to support devices. Cardiac: RRR, no murmurs, rubs, or gallops.  Respiratory: Clear anteriorly. GI: Distended with clean midline abdominal dressing. MS: No edema; No deformity. Neuro:  Intubated and sedated. Psych: Unable to assess; intubated and sedated.  Labs    High Sensitivity Troponin:   Recent Labs  Lab 05/05/20 0225 05/05/20 0517 05/05/20 0629  TROPONINIHS 536* 489* 474*      Chemistry Recent Labs  Lab 05/06/20 0302 05/06/20 1636 05/07/20 0409 05/08/20 0448 05/08/20 0620 05/08/20 1014  NA 146* 143 142 141  --   --   K 3.3* 3.8 3.5 5.4* 5.7* 6.2*  CL 113* 115* 113*  113*  --   --   CO2 22 22 24 23   --   --   GLUCOSE 150* 136* 169* 162*  --   --   BUN 14 18 17  30*  --   --   CREATININE 0.81 0.80 0.68 1.47*  --   --   CALCIUM 7.7* 8.1* 7.5* 7.8*  --   --   PROT 4.2* 4.3* 4.3*  --   --   --   ALBUMIN 1.7* 2.0* 2.1* 1.5*  --   --   AST 19 16 13*  --   --   --   ALT 11 10 10   --   --   --   ALKPHOS 40 41 39  --   --   --   BILITOT 0.8 1.0 1.0  --   --   --   GFRNONAA >60 >60 >60 53*  --   --   ANIONGAP 11 6 5 5   --   --      Hematology Recent Labs  Lab 05/06/20 1636 05/07/20 0409 05/08/20 0448  WBC 20.8* 23.2* 31.8*  RBC 2.51* 2.38* 2.63*  HGB 7.8* 7.4* 8.1*  HCT 24.4* 24.1* 27.5*  MCV 97.2 101.3* 104.6*  MCH  31.1 31.1 30.8  MCHC 32.0 30.7 29.5*  RDW 16.2* 16.6* 16.9*  PLT 185 177 191    BNP Recent Labs  Lab 05/04/20 2207  BNP 193.7*     DDimer No results for input(s): DDIMER in the last 168 hours.   Radiology    DG Chest Port 1 View  Result Date: 05/06/2020 CLINICAL DATA:  Intubated EXAM: PORTABLE CHEST 1 VIEW COMPARISON:  05/06/2020, 05/05/2020 FINDINGS: Interval intubation, tip of the endotracheal tube is 4.3 cm superior to carina. Right IJ central venous catheter tip over the SVC. Esophageal tube tip over the proximal stomach. Small left-sided pleural effusion. Dense basilar consolidation on the left with adjacent ground-glass opacity in the left lung. Right perihilar and basilar mild airspace infiltrates are unchanged. Stable cardiomediastinal silhouette. No pneumothorax. IMPRESSION: 1. Interval intubation with tip of the endotracheal tube 4.3 cm superior to carina. Esophageal tube tip over the proximal stomach. 2. No significant interval change in bilateral airspace disease, left greater than right, and small left pleural effusion since radiograph earlier today but overall worsening as compared with exams several days prior. Electronically Signed   By: Donavan Foil M.D.   On: 05/06/2020 19:39   DG Chest Port 1 View  Result Date: 05/06/2020 CLINICAL DATA:  Lethargy and shortness of breath. EXAM: PORTABLE CHEST 1 VIEW COMPARISON:  05/05/2020 FINDINGS: Enteric tube and right central venous catheter are unchanged in position. Shallow inspiration. Heart size and pulmonary vascularity are normal for technique. Patchy airspace disease in the left mid lung and both lung bases. This is progressing since the previous study. No pneumothorax. IMPRESSION: Patchy airspace disease in the left mid lung and both lung bases progressing since previous study. Electronically Signed   By: Lucienne Capers M.D.   On: 05/06/2020 16:09   [place] EKG SITE RITE  Result Date: 05/06/2020 If Site Rite image not attached,  placement could not be confirmed due to current cardiac rhythm.   Cardiac Studies   TTE (05/05/20): 1. Left ventricular ejection fraction, by estimation, is 55 to 60%. The  left ventricle has normal function. The left ventricle has no regional  wall motion abnormalities. Left ventricular diastolic parameters are  indeterminate.  2. Right ventricular systolic function is normal.  The right ventricular  size is normal. Tricuspid regurgitation signal is inadequate for assessing  PA pressure.  3. Challenging images.   Patient Profile     65 y.o. male with a hx of Crohn's disease, history of PE on Eliquis,prior bowel surgeries, presenting to the hospital with severe abdominal pain, closed-loop proximal jejunal obstruction/internal hernia,chronic and acute diverticulitis,underwent exploratory lap, small bowel resection for proximal jejunal perforation, postop day 2, last night with narrow complex tachycardia concerning for atrial fibrillation, runs of SVT, nonsustained VT.   Assessment & Plan    Paroxysmal atrial fibrillation: Patient currently maintaining sinus rhythm since yesterday evening.  He received IV digoxin this AM.  Amio on hold out of reported concerns that he was receiving too much volume.  Discontinue digoxin given significant bump in creatinine overnight (0.7->1.5).  Consider restarting amiodarone infusion if patient has recurrent a-fib with RVR.  BB and CCB contraindicated at this time given shock requiring vasopressor use.  Jejunal perforation:  Per surgery.  Pneumonia:  Continue vent support and antibiotics per CCM.  AKI: Likely multifactorial.  Avoid nephrotoxic agents and persistent hypotension.  Gentle hydration, as tolerated, per CCM and surgery.  For questions or updates, please contact [place] Please consult [email or web address] for contact info under [facility].     Signed, Nelva Bush, MD  05/08/2020, 11:07 AM

## 2020-05-08 NOTE — Consult Note (Addendum)
NAME: Brent Diaz  DOB: Sep 27, 1955  MRN: [id number]  Date/Time: 05/08/2020 3:30 PM  REQUESTING PROVIDER: [email or web address] Subjective:  REASON FOR CONSULT: Intestinal perforation ?No history available from patient.  Chart reviewed. Brent Diaz is a 65 y.o. male with a history of crohns, HTN, leucocytosis being worked up by [employer], presented to the ED by EMS on 04/21/2020 with severe abdominal pain of sudden onset. It was confined to left upper quadrant and was associated with nausea and vomiting.   Patient has had multiple abdominal surgeries.  In 1983 had small bowel resection.  In 2001 he had terminal ileum resection.  In 2011 he had laparoscopic gastrojejunostomy.  He has had complications of his abdominal surgery leading to intra-abdominal abscess in the past.  His last hospitalization for inflammatory bowel disease was in 2009.  He is followed by GI and is being treated with budesonide 9 mg daily.  He was previously on Humira which was DC'd May 2018 because of shingles.  In th ED vitals were 137/93, temp 98.2, HR 81 sats 97% Labs revealed a WBC of 32.8 9 baseline 20s), Hb 12.6, PLT 405, cr 0.53,  CT abdomen showed Interval mass-like dilatation of a segment of proximal jejunum containing a mass-like conglomeration of contents and fluid without wall thickening. There is adjacent mesenteric fluid and there is some pinching and swirling of the mesentery medial to this focally dilated loop with an appearance suggesting an internal hernia. Multiple colonic diverticula with wall thickening and pericolonic stranding and soft tissue density involving the proximal to mid sigmoid colon. The soft tissue density area adjacent to the sigmoid colon is irregular and contains several tiny locules of gas or air. He was started on zosyn.He was seen by surgeon and taken for exp  laparotomy.  The surgical note describes excessive adhesions from prior surgery and extremely dilated small bowel with 2 perforations  with copious amount of bile and old blood clots seen in the abdomen. After irrigation a portion of small intestine distal and proximal to the dilated jejunum was resected and bowel closed. A drain was left in place Post surgery pt is in step down. 05/04/20 bile leak noted .On 05/05/20 he developed narrow complex tachycardia and elevated troponin. CTA no PE but  Showed new lower lobe pneumonia- started on vanco on 05/06/20 as nares positive for MRSA intubated on 05/06/20  and on pressors He is needing more pressors now A repeat Ct abdomen done on 05/05/20 showed Postoperative changes in the region of the jejunum.Continued inflammation and extraluminal locules of gas adjacent to the sigmoid colon concerning for diverticulitis and contained Perforation.   I am asked to see the patient for sepsis and multiorgan failure   Past Medical History:  Diagnosis Date  . Cancer ([place])    skin  . Crohn's disease ([place])   . GERD (gastroesophageal reflux disease)   . Hypertension   . MRSA (methicillin resistant staph aureus) culture positive    cultured during sinus surgery 05/2017  . Neutrophilia   . Pulmonary embolism ([place]) 10/2017  . Shingles    resolved  . Wears contact lenses    Left eye    Past Surgical History:  Procedure Laterality Date  . ABDOMINAL SURGERY    . BOWEL RESECTION     x 3  . CATARACT EXTRACTION W/PHACO Left 10/18/2018   Procedure: CATARACT EXTRACTION PHACO AND INTRAOCULAR LENS PLACEMENT ([place]) LEFT;  Surgeon: Eulogio Bear, MD;  Location: [facility];  Service: Ophthalmology;  Laterality: Left;  . CATARACT EXTRACTION W/PHACO Right 12/06/2018   Procedure: CATARACT EXTRACTION PHACO AND INTRAOCULAR LENS PLACEMENT ([facility]) RIGHT;  Surgeon: Eulogio Bear, MD;  Location: [facility];  Service: Ophthalmology;  Laterality: Right;  . HYDROCELE EXCISION / REPAIR    . IMAGE GUIDED SINUS SURGERY N/A 05/21/2017   Procedure: IMAGE GUIDED SINUS SURGERY;  Surgeon: Margaretha Sheffield, MD;   Location: [facility];  Service: ENT;  Laterality: N/A;  need stryker disk  . LAPAROTOMY N/A 04/07/2020   Procedure: EXPLORATORY LAPAROTOMY;  Surgeon: Benjamine Sprague, DO;  Location: [facility];  Service: General;  Laterality: N/A;  . MAXILLARY ANTROSTOMY Right 05/21/2017   Procedure: MAXILLARY ANTROSTOMY;  Surgeon: Margaretha Sheffield, MD;  Location: [facility];  Service: ENT;  Laterality: Right;  . SEPTOPLASTY N/A 05/21/2017   Procedure: SEPTOPLASTY;  Surgeon: Margaretha Sheffield, MD;  Location: [facility];  Service: ENT;  Laterality: N/A;    Social History   Socioeconomic History  . Marital status: Married    Spouse name: Not on file  . Number of children: Not on file  . Years of education: Not on file  . Highest education level: Not on file  Occupational History  . Not on file  Tobacco Use  . Smoking status: Current Every Day Smoker    Packs/day: 0.50    Years: 47.00    Pack years: 23.50    Types: Cigarettes  . Smokeless tobacco: Never Used  . Tobacco comment: since age 59  Vaping Use  . Vaping Use: Never used  Substance and Sexual Activity  . Alcohol use: No  . Drug use: Never  . Sexual activity: Not on file  Other Topics Concern  . Not on file  Social History Narrative  . Not on file   Social Determinants of Health   Financial Resource Strain: Not on file  Food Insecurity: Not on file  Transportation Needs: Not on file  Physical Activity: Not on file  Stress: Not on file  Social Connections: Not on file  Intimate Partner Violence: Not on file    Family History  Problem Relation Age of Onset  . Aneurysm Father 93  . Dementia Mother 59   Allergies  Allergen Reactions  . Amlodipine Swelling    feet  . Aspirin Other (See Comments)    Peptic ulcers  . Flagyl [Metronidazole]     Gives neuropathy if taken too long    ? Current Facility-Administered Medications  Medication Dose Route Frequency Provider Last Rate Last Admin  . 0.9 %  sodium  chloride infusion  250 mL Intravenous Continuous Rust-Chester, Britton L, NP 5 mL/hr at 05/08/20 1431 250 mL at 05/08/20 1431  . acetaminophen (TYLENOL) tablet 650 mg  650 mg Oral Q4H PRN Rust-Chester, Huel Cote, NP       Or  . acetaminophen (TYLENOL) suppository 650 mg  650 mg Rectal Q6H PRN Rust-Chester, Britton L, NP   650 mg at 05/07/20 0321  . chlorhexidine gluconate (MEDLINE KIT) (PERIDEX) 0.12 % solution 15 mL  15 mL Mouth Rinse BID Rust-Chester, Britton L, NP   15 mL at 05/08/20 0812  . Chlorhexidine Gluconate Cloth 2 % PADS 6 each  6 each Topical Daily Novice, Isami, DO   6 each at 05/07/20 2208  . dexmedetomidine (PRECEDEX) 400 MCG/100ML (4 mcg/mL) infusion  0.4-1.2 mcg/kg/hr Intravenous Titrated Tyler Pita, MD 22.5 mL/hr at 05/08/20 0602 1.2 mcg/kg/hr at 05/08/20 0602  . dextrose 5 % solution  Intravenous Continuous Ottie Glazier, MD 100 mL/hr at 05/08/20 1246 New Bag at 05/08/20 1246  . docusate (COLACE) 50 MG/5ML liquid 100 mg  100 mg Per Tube BID Rust-Chester, Britton L, NP      . fentaNYL (SUBLIMAZE) bolus via infusion 50-100 mcg  50-100 mcg Intravenous Q2H PRN Rust-Chester, Britton L, NP   100 mcg at 05/07/20 0150  . fentaNYL 2578mg in NS 253m(1063mml) infusion-PREMIX  25-200 mcg/hr Intravenous Continuous Rust-Chester, Britton L, NP 20 mL/hr at 05/08/20 1356 200 mcg/hr at 05/08/20 1356  . heparin injection 5,000 Units  5,000 Units Subcutaneous Q8H Rust-Chester, Britton L, NP   5,000 Units at 05/08/20 1434  . hydrocortisone sodium succinate (SOLU-CORTEF) 100 MG injection 50 mg  50 mg Intravenous Q6H AleOttie GlazierD   50 mg at 05/08/20 1049  . insulin aspart (novoLOG) injection 0-9 Units  0-9 Units Subcutaneous Q4H Rust-Chester, Britton L, NP   1 Units at 05/08/20 1356  . ipratropium-albuterol (DUONEB) 0.5-2.5 (3) MG/3ML nebulizer solution 3 mL  3 mL Nebulization Q6H MorSharion SettlerP   3 mL at 05/08/20 0920  . ketamine (KETALAR) 500 mg in sodium chloride 0.9 % 100  mL (5 mg/mL) infusion  0.1-1 mg/kg/hr Intravenous Continuous AleOttie GlazierD 1.67 mL/hr at 05/08/20 1236 0.1 mg/kg/hr at 05/08/20 1236  . lactated ringers bolus 1,000 mL  1,000 mL Intravenous Once AleOttie GlazierD      . MEDLINE mouth rinse  15 mL Mouth Rinse 10 times per day Rust-Chester, Britton L, NP   15 mL at 05/08/20 1358  . midazolam (VERSED) injection 2 mg  2 mg Intravenous Q2H PRN Rust-Chester, Britton L, NP   2 mg at 05/07/20 0155  . morphine 2 MG/ML injection 1 mg  1 mg Intravenous Q2H PRN Sakai, Isami, DO   1 mg at 05/05/20 1010  . mupirocin ointment (BACTROBAN) 2 % 1 application  1 application Nasal BID Sakai, Isami, DO   1 application at 01/35/70/1749  . octreotide (SANDOSTATIN) injection 50 mcg  50 mcg Subcutaneous Q8H Sakai, Isami, DO   50 mcg at 05/08/20 0506  . ondansetron (ZOFRAN-ODT) disintegrating tablet 4 mg  4 mg Oral Q6H PRN SakLysle Pearlsami, DO       Or  . ondansetron (ZOFRAN) injection 4 mg  4 mg Intravenous Q6H PRN Sakai, Isami, DO   4 mg at 05/04/20 0713  . pantoprazole (PROTONIX) 80 mg in sodium chloride 0.9 % 100 mL IVPB  80 mg Intravenous Q12H CanFredirick MaudlinD 300 mL/hr at 05/08/20 1113 80 mg at 05/08/20 1113  . phenylephrine CONCENTRATED 100m26m sodium chloride 0.9% 250mL8m4mg/m7minfusion  0-400 mcg/min Intravenous Titrated Rust-Chester, Britton L, NP 60 mL/hr at 05/08/20 1324 400 mcg/min at 05/08/20 1324  . piperacillin-tazobactam (ZOSYN) IVPB 3.375 g  3.375 g Intravenous Q8H Sakai, Isami, DO 12.5 mL/hr at 05/08/20 1433 3.375 g at 05/08/20 1433  . polyethylene glycol (MIRALAX / GLYCOLAX) packet 17 g  17 g Per Tube Daily Rust-Chester, Britton L, NP      . promethazine (PHENERGAN) injection 12.5 mg  12.5 mg Intravenous Q6H PRN Sakai, Isami, DO   12.5 mg at 05/04/20 1240  . sodium chloride flush (NS) 0.9 % injection 10-40 mL  10-40 mL Intracatheter Q12H Gollan, TimothKathlene November 10 mL at 05/08/20 1050  . sodium chloride flush (NS) 0.9 % injection 10-40 mL  10-40  mL Intracatheter PRN GollanRockey SituthKathlene November    . TPN  ADULT (ION)   Intravenous Continuous TPN Dallie Piles, RPH 100 mL/hr at 05/07/20 1811 New Bag at 05/07/20 1811  . TPN ADULT (ION)   Intravenous Continuous TPN Lysle Pearl, Isami, DO      . vancomycin (VANCOREADY) IVPB 1500 mg/300 mL  1,500 mg Intravenous Q24H Sakai, Isami, DO 150 mL/hr at 05/08/20 1425 1,500 mg at 05/08/20 1425  . vasopressin (PITRESSIN) 20 Units in sodium chloride 0.9 % 100 mL infusion-*FOR SHOCK*  0-0.04 Units/min Intravenous Continuous Awilda Bill, NP 12 mL/hr at 05/08/20 1016 0.04 Units/min at 05/08/20 1016     Abtx:  Anti-infectives (From admission, onward)   Start     Dose/Rate Route Frequency Ordered Stop   05/08/20 1400  vancomycin (VANCOREADY) IVPB 1500 mg/300 mL        1,500 mg 150 mL/hr over 120 Minutes Intravenous Every 24 hours 05/08/20 0811     05/07/20 0200  vancomycin (VANCOREADY) IVPB 750 mg/150 mL  Status:  Discontinued        750 mg 150 mL/hr over 60 Minutes Intravenous Every 8 hours 05/06/20 1653 05/08/20 0811   05/06/20 1745  vancomycin (VANCOREADY) IVPB 1750 mg/350 mL        1,750 mg 175 mL/hr over 120 Minutes Intravenous  Once 05/06/20 1649 05/06/20 1953   04/25/2020 2200  piperacillin-tazobactam (ZOSYN) IVPB 3.375 g        3.375 g 12.5 mL/hr over 240 Minutes Intravenous Every 8 hours 04/20/2020 1852     04/26/2020 0700  piperacillin-tazobactam (ZOSYN) IVPB 3.375 g        3.375 g 100 mL/hr over 30 Minutes Intravenous  Once 05/04/2020 0647 04/26/2020 1846      REVIEW OF SYSTEMS:  NA : Objective:  VITALS:  BP (!) 93/46   Pulse 78   Temp 98.4 F (36.9 C) (Axillary)   Resp (!) 21   Ht 5' 10"  (1.778 m)   Wt 83.5 kg   SpO2 100%   BMI 26.41 kg/m  PHYSICAL EXAM:  General:intubated sedated with ketamine, fentanyl On  Pressors  Head: Normocephalic, without obvious abnormality, atraumatic. Eyes: Conjunctivae clear, anicteric sclerae. Pupils are equal NG tubes- bilious fluid Neck: Supple, rt  IJ no carotid bruit and no JVD.. Lungs: b/l air entry- decreased bases Heart: tachycardia Abdomen: laparotomy -surgicall wound packing Drain with bilious fluid Extremities: edema Rt femoral line Foley catheter Skin: No rashes or lesions. Or bruising Lymph: Cervical, supraclavicular normal. Neurologic: cannot be assessed Pertinent Labs Lab Results CBC    Component Value Date/Time   WBC 31.8 (H) 05/08/2020 0448   RBC 2.63 (L) 05/08/2020 0448   HGB 8.1 (L) 05/08/2020 0448   HCT 27.5 (L) 05/08/2020 0448   PLT 191 05/08/2020 0448   MCV 104.6 (H) 05/08/2020 0448   MCH 30.8 05/08/2020 0448   MCHC 29.5 (L) 05/08/2020 0448   RDW 16.9 (H) 05/08/2020 0448   LYMPHSABS 2.1 05/08/2020 0448   MONOABS 2.8 (H) 05/08/2020 0448   EOSABS 0.8 (H) 05/08/2020 0448   BASOSABS 0.2 (H) 05/08/2020 0448    CMP Latest Ref Rng & Units 05/08/2020 05/08/2020 05/08/2020  Glucose 70 - 99 mg/dL - - -  BUN 8 - 23 mg/dL - - -  Creatinine 0.61 - 1.24 mg/dL - - -  Sodium 135 - 145 mmol/L - - -  Potassium 3.5 - 5.1 mmol/L - 6.2(H) 5.7(H)  Chloride 98 - 111 mmol/L - - -  CO2 22 - 32 mmol/L - - -  Calcium 8.9 -  10.3 mg/dL - - -  Total Protein 6.5 - 8.1 g/dL 3.9(L) - -  Total Bilirubin 0.3 - 1.2 mg/dL 1.1 - -  Alkaline Phos 38 - 126 U/L 52 - -  AST 15 - 41 U/L 17 - -  ALT 0 - 44 U/L 9 - -      Microbiology: Recent Results (from the past 240 hour(s))  Resp Panel by RT-PCR (Flu A&B, Covid) Nasopharyngeal Swab     Status: None   Collection Time: 04/14/2020 10:40 AM   Specimen: Nasopharyngeal Swab; Nasopharyngeal(NP) swabs in vial transport medium  Result Value Ref Range Status   SARS Coronavirus 2 by RT PCR NEGATIVE NEGATIVE Final    Comment: (NOTE) SARS-CoV-2 target nucleic acids are NOT DETECTED.  The SARS-CoV-2 RNA is generally detectable in upper respiratory specimens during the acute phase of infection. The lowest concentration of SARS-CoV-2 viral copies this assay can detect is 138 copies/mL. A  negative result does not preclude SARS-Cov-2 infection and should not be used as the sole basis for treatment or other patient management decisions. A negative result may occur with  improper specimen collection/handling, submission of specimen other than nasopharyngeal swab, presence of viral mutation(s) within the areas targeted by this assay, and inadequate number of viral copies(<138 copies/mL). A negative result must be combined with clinical observations, patient history, and epidemiological information. The expected result is Negative.  Fact Sheet for Patients:  EntrepreneurPulse.com.au  Fact Sheet for Healthcare Providers:  IncredibleEmployment.be  This test is no t yet approved or cleared by the [place] FDA and  has been authorized for detection and/or diagnosis of SARS-CoV-2 by FDA under an Emergency Use Authorization (EUA). This EUA will remain  in effect (meaning this test can be used) for the duration of the COVID-19 declaration under Section 564(b)(1) of the Act, 21 U.S.C.section 360bbb-3(b)(1), unless the authorization is terminated  or revoked sooner.       Influenza A by PCR NEGATIVE NEGATIVE Final   Influenza B by PCR NEGATIVE NEGATIVE Final    Comment: (NOTE) The Xpert Xpress SARS-CoV-2/FLU/RSV plus assay is intended as an aid in the diagnosis of influenza from Nasopharyngeal swab specimens and should not be used as a sole basis for treatment. Nasal washings and aspirates are unacceptable for Xpert Xpress SARS-CoV-2/FLU/RSV testing.  Fact Sheet for Patients: EntrepreneurPulse.com.au  Fact Sheet for Healthcare Providers: IncredibleEmployment.be  This test is not yet approved or cleared by the [place] FDA and has been authorized for detection and/or diagnosis of SARS-CoV-2 by FDA under an Emergency Use Authorization (EUA). This EUA will remain in effect (meaning this test can  be used) for the duration of the COVID-19 declaration under Section 564(b)(1) of the Act, 21 U.S.C. section 360bbb-3(b)(1), unless the authorization is terminated or revoked.  Performed at [facility], [address] [place], [place] [address]   CULTURE, BLOOD (ROUTINE X 2) w Reflex to ID Panel     Status: None (Preliminary result)   Collection Time: 05/04/20 11:27 PM   Specimen: BLOOD  Result Value Ref Range Status   Specimen Description BLOOD BLOOD RIGHT FOREARM  Final   Special Requests   Final    BOTTLES DRAWN AEROBIC AND ANAEROBIC Blood Culture adequate volume   Culture   Final    NO GROWTH 4 DAYS Performed at [facility], [address] [place],  [address]    Report Status PENDING  Incomplete  CULTURE, BLOOD (ROUTINE X 2) w Reflex to ID Panel  Status: None (Preliminary result)   Collection Time: 05/05/20  6:29 AM   Specimen: BLOOD  Result Value Ref Range Status   Specimen Description BLOOD RIGHT [facility]  Final   Special Requests   Final    BOTTLES DRAWN AEROBIC AND ANAEROBIC Blood Culture adequate volume   Culture   Final    NO GROWTH 3 DAYS Performed at [facility], [address] [place], [place] [address]    Report Status PENDING  Incomplete  MRSA PCR Screening     Status: Abnormal   Collection Time: 05/06/20  5:21 PM   Specimen: Nasal Mucosa; Nasopharyngeal  Result Value Ref Range Status   MRSA by PCR POSITIVE (A) NEGATIVE Final    Comment:        The GeneXpert MRSA Assay (FDA approved for NASAL specimens only), is one component of a comprehensive MRSA colonization surveillance program. It is not intended to diagnose MRSA infection nor to guide or monitor treatment for MRSA infections. RESULT CALLED TO, READ BACK BY AND VERIFIED WITH: EMILY GANNON 05/06/20 AT 1912 BY ACR Performed at [facility], [address] [place], [place] [address]   Culture, respiratory (non-expectorated)     Status: None  (Preliminary result)   Collection Time: 05/06/20  7:37 PM   Specimen: Tracheal Aspirate; Respiratory  Result Value Ref Range Status   Specimen Description   Final    TRACHEAL ASPIRATE Performed at [facility], [address] [place], [place] [address]    Special Requests   Final    NONE Performed at [facility], [address] [place], [place] [address]    Gram Stain   Final    ABUNDANT WBC PRESENT, PREDOMINANTLY PMN MODERATE GRAM POSITIVE COCCI    Culture   Final    ABUNDANT STAPHYLOCOCCUS AUREUS CULTURE REINCUBATED FOR BETTER GROWTH Performed at [facility], [place] [address] [place], [place] [address]    Report Status PENDING  Incomplete    IMAGING RESULTS:  I have personally reviewed the films ?Left lower lobe infiltrate Impression/Recommendation ? ?65 year old male with history of Crohn's disease with multiple bowel surgeries in the past admitted with severe abdominal pain and found to have perforated jejunal section of the bowel.  Status post resection of portion of the jejunum with closing of the ends.  Has bilious fluid in the JP drain suggestive of ongoing bile leak.  Septic shock on pressors. Also concerning is  diverticulitis the sigmoid colon with contained perforation. Even though the CAT scan from 05/05/2020 did not show any abscess, need to repeat it and if present will have 2 ask IR to place a JP drain. Patient is on appropriate antibiotic which is Zosyn. He is at risk for Candida infection because of the perforation.  Patient needs adequate source control . without source control antibiotics are futile.  Spoke with Dr. Lysle Pearl.  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  So his wife does not want to pursue further imaging or surgery.  Patient has been seen by palliative but comfort care has not been decided yet.   Acute hypoxic hyper respiratory failure.  Secondary to intra-abdominal surgery and possible  aspiration  leading to left lower lobe pneumonia.  Is currently intubated.  And Zosyn treats the pneumonia as well.  MRSA in the nares and tracheal aspirate and hence patient is also on vancomycin as the pneumonia could be secondary to MRSA  Leukocytosis.  Patient has baseline leukocytosis and  is being worked up by oncology.  Currently has an increase in the same because of infection.    Paroxysmal A. fib seen b  cardiology.  Currently on amiodarone infusion.  Hyperkalemia  Hypoalbuminemia.  History of PE and on Eliquis.  Which is currently on hold because of dark tarry stools and recent abdominal surgery.  AKI due to sepsis.  Watch closely as he is on vancomycin which may have to be changed.  Will evaluate tomorrow with the creatinine.  _Discussed with Dr. Lysle Pearl.  There is no plan for repeat CT abdomen or surgery.  He had discussed with his wife. Palliative on board.  Would recommend considering comfort care if aggressive measures are not being pursued.  Discussed with care team. Patient critically ill.  Prognosis poor. Note:  This document was prepared using Dragon voice recognition software and may include unintentional dictation errors.

## 2020-05-08 NOTE — Consult Note (Signed)
PHARMACY - TOTAL PARENTERAL NUTRITION CONSULT NOTE   Indication: Fistula  Patient Measurements: Height: 5' 10"  (177.8 cm) Weight: 83.5 kg (184 lb 1.4 oz) IBW/kg (Calculated) : 73 TPN AdjBW (KG): 81.1 Body mass index is 26.41 kg/m.  Assessment:  Patient is a 65 y/o M with medical history including Crohn's disease, history of PE, GERD who presented to the ED 12/30 with severe abdominal pain. Patient went for emergent exploratory laparotomy with small bowel resection and lysis of adhesions. 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   Glucose / Insulin: No apparent history of diabetes. BG 150 - 190 last 24h, 8 units insulin urequired  Electrolytes: WNL Renal: Scr < 1, stable LFTs / TGs: Transaminases within normal limits. TG ordered for tomorrow AM. Prealbumin / albumin: Albumin 2.7-->2.1, prealbumin <5 mg/dL Intake / Output; MIVF: Net + 2.3L this admission.  GI Imaging: 12/30 CT Abdomen Pelvis:   Suspicious for a closed loop proximal jejunal obstruction due to an internal hernia.  Suspicious for chronic and acute diverticulitis involving the proximal to mid sigmoid colon with associated interval irregular adjacent soft tissue mass measuring 5.4 x 4.6 x 3.4 cm.  Small to moderate amount of free peritoneal fluid. 01/02 CXR: No significant interval change in bilateral airspace disease, left greater than right, and small left pleural effusion since radiograph earlier today but overall worsening as compared with exams several days prior Surgeries / Procedures:  12/30 Exploratory laparotomy with small bowel resection and lysis of adhesions  Central access: 12/30 (CVC Double Lumen 04/29/2020 Right Internal jugular) TPN start date: 12/31   Nutritional Goals (per RD recommendation on 12/31): kCal: 2100 - 2300, Protein: 110-120g, Fluid: 2.2 - 2.6L Goal TPN rate is 100 mL/hr (provides 120 g of protein and 2227 kcals per day)  Current Nutrition:  NPO  Plan:    Potassium trending up with bump in creatinine. Will hold current bag of TPN and start D10 at same rate 100 mL/hr. Will plan to stop fluids once new bag of TPN hung tonight.  Continue TPN at goal rate of 100 mL/hr  Nutritional Components:  Fluid: 2500 mL  Protein: 120 g  Dextrose: 408 g  Lipids: 36 g  Kcal: 2227 / day  Electrolytes in TPN: 50 mEq/L of Na, 0 mEq/L of K, 5 mEq/L of Ca, 5 mEq/L of Mg, and 15 mmol/L of Phos. Continue Cl:Ac ratio to 1:2  Will proceed cautiously with uptrending potassium and remove all potassium from next bag of TPN. Prefer replacement outside of TPN if necessary rather than having to hold TPN again if potassium continues to trend high.  Insulin 10 units IV x 1 for treatment of hyperkalemia. Will plan to recheck potassium this afternoon.  Add standard MVI and trace elements to TPN, add thiamine for re-feed risk  Continue Sensitive q4h SSI and adjust as needed   Monitor TPN labs on Mon/Thurs, daily until stable  Tawnya Crook, PharmD Clinical Pharmacist 05/08/2020 11:42 AM

## 2020-05-08 NOTE — Progress Notes (Signed)
Subjective:  CC: Brent Diaz is a 65 y.o. male  Hospital stay day 5, 5 Days Post-Op ex-lap, small bowel resection   HPI: Remains Intubated and sedated.  Increasing need for pressor support.  ROS:  Unable to obtain secondary to patient status   Objective:   Temp:  [98.9 F (37.2 C)-99.5 F (37.5 C)] 99.5 F (37.5 C) (01/04 0000) Pulse Rate:  [33-140] 79 (01/04 0610) Resp:  [16-22] 20 (01/04 0610) BP: (69-116)/(29-98) 91/52 (01/04 0610) SpO2:  [80 %-100 %] 99 % (01/04 0610) FiO2 (%):  [55 %-100 %] 100 % (01/04 0921) Weight:  [83.5 kg] 83.5 kg (01/04 0407)     Height: 5' 10"  (177.8 cm) Weight: 83.5 kg BMI (Calculated): 26.41   Intake/Output this shift:   Intake/Output Summary (Last 24 hours) at 05/08/2020 1312 Last data filed at 05/08/2020 1016 Gross per 24 hour  Intake 776.36 ml  Output 1190 ml  Net -413.64 ml    Constitutional :  intubated and sedated  Respiratory:  clear to auscultation bilaterally  Cardiovascular:  irregularly irregular rhythm  Gastrointestinal: soft, no guarding, sedated.  midline wound increasing purulent drainage. JP with more bilious output in bulb today. NG with more bilious output  Skin: Cool and moist.   Psychiatric: Sedated.       LABS:  CMP Latest Ref Rng & Units 05/08/2020 05/08/2020 05/08/2020  Glucose 70 - 99 mg/dL - - -  BUN 8 - 23 mg/dL - - -  Creatinine 0.61 - 1.24 mg/dL - - -  Sodium 135 - 145 mmol/L - - -  Potassium 3.5 - 5.1 mmol/L - 6.2(H) 5.7(H)  Chloride 98 - 111 mmol/L - - -  CO2 22 - 32 mmol/L - - -  Calcium 8.9 - 10.3 mg/dL - - -  Total Protein 6.5 - 8.1 g/dL 3.9(L) - -  Total Bilirubin 0.3 - 1.2 mg/dL 1.1 - -  Alkaline Phos 38 - 126 U/L 52 - -  AST 15 - 41 U/L 17 - -  ALT 0 - 44 U/L 9 - -   CBC Latest Ref Rng & Units 05/08/2020 05/07/2020 05/06/2020  WBC 4.0 - 10.5 K/uL 31.8(H) 23.2(H) 20.8(H)  Hemoglobin 13.0 - 17.0 g/dL 8.1(L) 7.4(L) 7.8(L)  Hematocrit 39.0 - 52.0 % 27.5(L) 24.1(L) 24.4(L)  Platelets 150 - 400 K/uL 191 177  185    RADS: n/a Assessment:   S/p ex-lap, proximal jejunal resection and re-anastamosis for perforted 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  Unfortunately already leaking bile, likely due to the chronically inflammed tissue.  Continued deterioration with increasing need  For pressor support.  Continue with TPN, strict NPO, NG decompression, JP drain to control leak, octreotide in hopes of slowing output from surgery standpoint.  JP output today with slight bump in output, but t.bili levels continues to remain wnl past couple days, and abdomen with no involuntary guarding.  Even if drainage is inadequate, unsure if patient can even tolerate CT guided drainage at this point.  Leukocytosis likely from combination of worsening sepsis and steroid use.  Continue foley for strict I&O, IV abx, lyte replacements.  Hx of PE, on eliquis. hgb stabilizing.  Ok to resume anticoagulation   Palliative care discussed care with family and patient is now partial code.    Appreciate all consults and recs

## 2020-05-08 NOTE — Progress Notes (Signed)
@   outset of shift, pt increasingly hypotensive. Vaso increased to .04 & Neo titrated up to 400. Pt's BP very labile t/o shift, stable @ this time. Per NP, keep Femoral central line in for now d/t hemodynamic instability. Pt remains on Fent and dex gtt's. NG tube continues to put out bile colored output @ LIWS (250 out overnight). JP drain putting out minimal bile colored output. Turn q2. Pt in no acute distress @ this time. Will continue to monitor.

## 2020-05-08 NOTE — Consult Note (Signed)
Pharmacy Antibiotic Note  Brent Diaz is a 65 y.o. male admitted on 04/16/2020 with Peritonitis. Pt presented via EMS with severe abdominal pain associated with hematemesis and hematochezia. PMH include Crohn's, bowel resection, GERD, HTN, and PE in 2019. Pt underwent emergent surgery for closed-loop bowel obstruction following reversal of apixaban on 12/30. Pharmacy was consulted for vancomycin and Zosyn dosing. Since admission his renal function has been stable  Plan:  1) Continue Zosyn 3.375g IV q8h (4 hour infusion).  2) Change vancomycin to 1500 mg IV q24h. May consider linezolid with worsening renal function.  Continue to follow renal function closely.  Temp (24hrs), Avg:99 F (37.2 C), Min:98.4 F (36.9 C), Max:99.5 F (37.5 C)  Recent Labs  Lab 05/05/20 0225 05/05/20 0235 05/05/20 0629 05/05/20 0957 05/05/20 1207 05/06/20 0302 05/06/20 1636 05/06/20 1956 05/07/20 0409 05/08/20 0448  WBC 28.8*  --   --   --   --  20.0* 20.8*  --  23.2* 31.8*  CREATININE 0.87  --  0.88  --   --  0.81 0.80  --  0.68 1.47*  LATICACIDVEN  --    < > 3.7* 4.7* 4.2*  --  2.4* 2.1* 1.9  --    < > = values in this interval not displayed.    Estimated Creatinine Clearance: 52.4 mL/min (A) (by C-G formula based on SCr of 1.47 mg/dL (H)).    Antimicrobials this admission: 12/30 Zosyn >> 01/02 vancomycin >>  Microbiology results: 01/02 tracheal asp: staph aureus, susceptibilities pending 12/30 resp. Panel: negative 01/02 MRSA PCR: (+) 01/01 BCx: NG  Thank you for allowing pharmacy to be a part of this patient's care.  Tawnya Crook, PharmD 05/08/2020 3:36 PM

## 2020-05-08 NOTE — Consult Note (Signed)
Consultation Note Date: 05/08/2020   Patient Name: Brent Diaz  DOB: 1955-05-28  MRN: [id number]  Age / Sex: 65 y.o., male  PCP: Lynnell Jude, MD Referring Physician: Benjamine Sprague, DO  Reason for Consultation: Establishing goals of care  HPI/Patient Profile: 65 y.o. male  with past medical history of Crohn's disease, bowel resection x 3, cancer, GERD, hypertension, MRSA, PE on Eliquis, shingles, current nicotine abuse admitted on 04/17/2020 with LUQ sharp pain with nausea/vomiting. S/P exploratory lap with proximal jejunal resection and re-anastamosis for perforated bowel secondary to Crohn's stricture and pressure buildup from GIB (per surgery). He continues with poor wound healing with chronic inflammation and leaking bile and requiring ICU admission with ventilator, vasopressor support, NGT decompression, JP drain to control leak, and octreotide to decreased output. Also with LLL pneumonia and requiring 100% FiO2.   Clinical Assessment and Goals of Care: I reviewed Mr. Loh records and discussed care with Dr. Lanney Gins and Westly Pam, RN. I met with sister, Zigmund Daniel, at bedside and then spoke separately with wife, Hassan Rowan, via telephone. Discussed with them each that Mr. Bartmess has ongoing leak with poor wound healing (underlying inflammation) leading to infection and ongoing sepsis. He is requiring increasing support from ventilator and blood pressure support from the overwhelming infection to his body that is expected to lead to further decline in organ function. I expressed that I am worried that he is too ill and fragile and approaching end of life. Neither were shocked to learn this but are still appropriately saddened by this information.   They both express to me that Mr. Mccanless has had worsening health since 2018 and especially over the past year. They have witnessed his health complications take more and more  out of him and requiring longer recovery periods. We discussed the next step to come together to discuss some difficult decisions in how to move forward and best care for Mr. Cheatum given his poor prognosis. I did speak with Hassan Rowan about code status and she does tell me that they have discussed before that neither one of them would want to be kept alive prolonged on machines. She agrees that if his heart stops that we should not attempt CPR and resuscitation but notify family. Further decisions will need to be made tomorrow when we meet in person. They will notify me of time when they will be able to meet.   All questions/concerns addressed. Emotional support provided.   Primary Decision Maker NEXT OF KIN wife Hassan Rowan    SUMMARY OF RECOMMENDATIONS   - DNR but continue current interventions for now - Plans for in person goals of care meeting tomorrow (time TBD)  Code Status/Advance Care Planning:  Limited code -  Continue vent support   Symptom Management:   Per PCCM, surgery  Palliative Prophylaxis:   Aspiration, Bowel Regimen, Frequent Pain Assessment, Oral Care and Turn Reposition  Psycho-social/Spiritual:   Desire for further Chaplaincy support:yes  Additional Recommendations: Compassionate Wean Education and Grief/Bereavement Support  Prognosis:   Overall prognosis is  very poor.   Discharge Planning: Anticipated Hospital Death      Primary Diagnoses: Present on Admission: . Perforated bowel ([place]) . Leukocytosis   I have reviewed the medical record, interviewed the patient and family, and examined the patient. The following aspects are pertinent.  Past Medical History:  Diagnosis Date  . Cancer ([place])    skin  . Crohn's disease ([place])   . GERD (gastroesophageal reflux disease)   . Hypertension   . MRSA (methicillin resistant staph aureus) culture positive    cultured during sinus surgery 05/2017  . Neutrophilia   . Pulmonary embolism ([place]) 10/2017  . Shingles     resolved  . Wears contact lenses    Left eye   Social History   Socioeconomic History  . Marital status: Married    Spouse name: Not on file  . Number of children: Not on file  . Years of education: Not on file  . Highest education level: Not on file  Occupational History  . Not on file  Tobacco Use  . Smoking status: Current Every Day Smoker    Packs/day: 0.50    Years: 47.00    Pack years: 23.50    Types: Cigarettes  . Smokeless tobacco: Never Used  . Tobacco comment: since age 24  Vaping Use  . Vaping Use: Never used  Substance and Sexual Activity  . Alcohol use: No  . Drug use: Never  . Sexual activity: Not on file  Other Topics Concern  . Not on file  Social History Narrative  . Not on file   Social Determinants of Health   Financial Resource Strain: Not on file  Food Insecurity: Not on file  Transportation Needs: Not on file  Physical Activity: Not on file  Stress: Not on file  Social Connections: Not on file   Family History  Problem Relation Age of Onset  . Aneurysm Father 51  . Dementia Mother 3   Scheduled Meds: . chlorhexidine gluconate (MEDLINE KIT)  15 mL Mouth Rinse BID  . Chlorhexidine Gluconate Cloth  6 each Topical Daily  . dextrose  1 ampule Intravenous Once  . docusate  100 mg Per Tube BID  . heparin injection (subcutaneous)  5,000 Units Subcutaneous Q8H  . hydrocortisone sod succinate (SOLU-CORTEF) inj  50 mg Intravenous Q6H  . insulin aspart  0-9 Units Subcutaneous Q4H  . insulin aspart  10 Units Intravenous Once  . ipratropium-albuterol  3 mL Nebulization Q6H  . mouth rinse  15 mL Mouth Rinse 10 times per day  . mupirocin ointment  1 application Nasal BID  . octreotide  50 mcg Subcutaneous Q8H  . polyethylene glycol  17 g Per Tube Daily  . sodium chloride flush  10-40 mL Intracatheter Q12H   Continuous Infusions: . sodium chloride Stopped (05/07/20 0538)  . dexmedetomidine (PRECEDEX) IV infusion 1.2 mcg/kg/hr (05/08/20 0602)   . fentaNYL infusion INTRAVENOUS 200 mcg/hr (05/08/20 0242)  . ketamine (KETALAR) Adult IV Infusion    . lactated ringers    . pantoprazole (PROTONIX) IV 80 mg (05/08/20 1113)  . phenylephrine (NEO-SYNEPHRINE) Adult infusion 400 mcg/min (05/08/20 0854)  . piperacillin-tazobactam (ZOSYN)  IV 3.375 g (05/08/20 0506)  . TPN ADULT (ION) 100 mL/hr at 05/07/20 1811  . vancomycin    . vasopressin 0.04 Units/min (05/08/20 1016)   PRN Meds:.acetaminophen **OR** acetaminophen, fentaNYL, midazolam, morphine injection, ondansetron **OR** ondansetron (ZOFRAN) IV, promethazine, sodium chloride flush Allergies  Allergen Reactions  . Amlodipine Swelling  feet  . Aspirin Other (See Comments)    Peptic ulcers  . Flagyl [Metronidazole]     Gives neuropathy if taken too long    Review of Systems  Unable to perform ROS: Acuity of condition    Physical Exam Vitals and nursing note reviewed.  Constitutional:      General: He is not in acute distress.    Appearance: He is ill-appearing.     Interventions: He is sedated and intubated.  Cardiovascular:     Rate and Rhythm: Normal rate.  Pulmonary:     Effort: No tachypnea or accessory muscle usage. He is intubated.     Comments: 100% FiO2 Abdominal:     Comments: Midline dressing clean, dry, intact JP charged and dressing clean, dry, intact  Neurological:     Comments: Sedated on vent     Vital Signs: BP (!) 91/52   Pulse 79   Temp 99.5 F (37.5 C) (Axillary)   Resp 20   Ht 5' 10"  (1.778 m)   Wt 83.5 kg   SpO2 99%   BMI 26.41 kg/m  Pain Scale: CPOT   Pain Score: 0-No pain   SpO2: SpO2: 99 % O2 Device:SpO2: 99 % O2 Flow Rate: .O2 Flow Rate (L/min): 4 L/min  IO: Intake/output summary:   Intake/Output Summary (Last 24 hours) at 05/08/2020 1124 Last data filed at 05/08/2020 1016 Gross per 24 hour  Intake 1885.92 ml  Output 1490 ml  Net 395.92 ml    LBM: Last BM Date:  (PTA) Baseline Weight: Weight: 81.1 kg Most recent  weight: Weight: 83.5 kg     Palliative Assessment/Data:     Time In: 1120 Time Out: 1230 Time Total: 70 min Greater than 50%  of this time was spent counseling and coordinating care related to the above assessment and plan.  Signed by: Vinie Sill, NP Palliative Medicine Team Pager # [phone] (M-F 8a-5p) Team Phone # [phone] (Nights/Weekends)

## 2020-05-08 NOTE — Progress Notes (Signed)
NAME:  Brent Diaz, MRN:  [id number], DOB:  May 23, 1955, LOS: 5 ADMISSION DATE:  04/05/2020, CONSULTATION DATE:  05/06/2020 REFERRING MD:  Dr. Lysle Pearl, CHIEF COMPLAINT: abdominal pain  Brief History:  65 year old male with Crohn's disease admitted with closed-loop bowel obstruction status post ex lap and small bowel resection for proximal jejunal perforation now with suspected left lower lobe aspiration pneumonia.  History of Present Illness:  65 year old male with significant history of Crohn's disease and previous small bowel resection, presented to the ED with suprapubic abdominal pain.  Patient reported sharp LUQ pain without radiation, associated with nausea and vomiting and exacerbated by touch.  Patient had bloody vomitus while in the ED and reported an episode of dark tarry stools on 05/02/2020.  Patient reported that he is on Eliquis for previous pulmonary embolism.  ED course: CT abdomen showed dilated jejunum and possible internal hernia and/or closed-loop bowel obstruction. Labs: WBC 32.8, lactic 3.8 & Hgb stable at 12.6. Patient was taken emergently to the OR on 05/04/2020.  Postop course complicated by bile leak noted on 05/04/2020, given location of perforation and prior abdominal surgeries in the setting of baseline Crohn's surgery is attempting to avoid more procedures.    EVENTS:  On 05/05/2020 patient developed narrow complex tachycardia overnight, with elevated troponin at 536~474 and complaints of shortness of breath.  CTa negative for PE, but showed new left lower lobe pneumonia.  Lactate increased to 6.8, down to 2.3 after fluid boluses.   On 05/06/2020 patient again developed shortness of breath unable to clear secretions, requiring NT suctioning by respiratory therapy, ultimately requiring rapid intubation and mechanical ventilation.  PCCM consulted for further management and monitoring.  05/08/19- Patient on vasopressor support in septic shock from intraabdominal source of  infection due to perforated diverticulitis.  Palliative consultation is in process.  Stopped IV LR today, dcd albumin and amio gtt to decrease volume of infusions and delivered digoxin load. Will add vasopressin to decrease neosynephrine as it is arrythmogenic and contributing to episodes of tachyarrythmia and AFrVR.   05/09/19- patient remains critically ill septic.  He is very sensitive to pressors and to minimize the hypotension will use ketamine for sedation instead of propofol.  Hospital is nearing end of Precedex supply today.  Placed ID consultation due to multi organ failure.   Have discussed with Dr Lysle Pearl no additional plan for surgery.  Palliative care met with family today.    Past Medical History:  Crohn's Pulmonary embolism - 10/2017 Hypertension GERD Cancer -skin Significant Hospital Events:  04/16/2020-admit to stepdown unit after emergent ex lap with small bowel resection for proximal jejunal perforation 05/04/20-bile leak noted, JP drain in place 05/05/20-narrow complex tachycardia, CTA negative for PE, new LLL pneumonia  Consults:  Cardiology Hospitalist  Procedures:  04/21/2020-ex lap and small bowel resection for proximal jejunal perforation 04/17/2020 - CVC 2 L RIJ>> 05/06/20 - ETT >> 05/06/20 - CVC 3 L R femoral >> Significant Diagnostic Tests:  04/30/2020 CT abdomen w contrast > findings suspicious for closed-loop proximal jejunal obstruction due to an internal hernia.  Mild wall thickening involving the jejunum distal to the focally dilated loop of jejunum in the left mid abdomen.  Findings suspicious for chronic and acute diverticulitis involving the proximal and mid sigmoid colon with associated interval irregular adjacent soft tissue mass measuring 5.4 x 4.6 x 3.4 cm.  Small to moderate amount of free peritoneal fluid.  1.2 cm medium density exophytic mass arising from the upper pole of the left  kidney.  Mild diffuse hepatic steatosis. 05/04/2020 CTA chest > no pulmonary  embolism, dense consolidation of the left lower lobe with extensive endobronchial debris.  Emphysema noted 05/05/2020 CT abdomen wo contrast > postoperative changes in the region of the jejunum, continued inflammation and extraluminal locules of gas adjacent to the sigmoid colon concerning for diverticulitis and contained perforation.  No bowel obstruction 05/05/2020 Echocardiogram > LVEF 55 to 60%, no regional wall motion abnormalities, no LVH.  Mild TR, all else WNL. Micro Data:  04/13/2020 COVID-19 > negative 04/24/2020 influenza A/B > negative 05/05/2020 BC x 2 >> 05/06/2020 Tracheal aspirate >>  Antimicrobials:  04/09/2020 Zosyn >> 05/06/2020 Vancomycin >>    Objective   Blood pressure (!) 93/46, pulse 78, temperature 98.4 F (36.9 C), temperature source Axillary, resp. rate (!) 21, height _0  (1.778 m), weight 83.5 kg, SpO2 100 %.    Vent Mode: PRVC FiO2 (%):  [55 %-100 %] 80 % Set Rate:  [20 bmp] 20 bmp Vt Set:  [480 mL] 480 mL PEEP:  [5 cmH20-8 cmH20] 8 cmH20 Plateau Pressure:  [14 cmH20-15 cmH20] 15 cmH20   Intake/Output Summary (Last 24 hours) at 05/08/2020 1611 Last data filed at 05/08/2020 1016 Gross per 24 hour  Intake -  Output 890 ml  Net -890 ml   Filed Weights   04/11/2020 1853 05/04/20 1225 05/08/20 0407  Weight: 81.1 kg 74.9 kg 83.5 kg    Examination: General: Adult male, critically ill, lying in bed intubated & sedated requiring mechanical ventilation  HEENT: MM pink/moist, anicteric, atraumatic, neck supple-ruddy complexion Neuro: Sedated, unable to follow commands, PERRL +3, MAE CV: s1s2 RRR, ST on monitor, no r/m/g Pulm: Regular, non labored rhonchi-BUL & diminished-BLL GI: soft, rounded with covered midline incision and JP drain, bs present GU: foley in place with clear yellow urine Skin: Known abdominal surgical incision, scattered keratosis appearing lesions that are intact, scattered scabbed abrasions and scattered ecchymosis Extremities: warm/dry,  pulses + 2 R/P, no edema noted  Resolved Hospital Problem list     Assessment & Plan:  Acute Hypoxic / Hypercapnic Respiratory Failure  Secondary to / in the setting of LLL Pneumonia & Abdominal Surgery Suspected Aspiration Pneumonia CTa on 05/04/2020 showed dense consolidation with endobronchial debris in the left lower lobe, suspected aspiration.  Patient continues to have difficulty clearing secretions requiring NTS, ultimately needing emergent intubation on 05/06/20. - Ventilator settings: PRVC  7 mL/kg, 75% FiO2, 5 PEEP, continue ventilator support & lung protective strategies - Wean PEEP & FiO2 as tolerated, maintain SpO2 > 90%5 - Head of bed elevated 30 degrees, VAP protocol in place - Plateau pressures less than 30 cm H20  - Intermittent chest x-ray & ABG PRN - Daily WUA with SBT as tolerated  - Ensure adequate pulmonary hygiene  - F/u cultures, trend lactic/PCT - PAD protocol in place: continue Fentanyl drip & Precedex with Versed IVP. Avoiding propofol due to TPN administration  Leukocytosis In the setting of aspiration pneumonia and proximal jejunal perforation PMHx: Chronic leukocytosis and follows with hematology outpatient -Monitor daily WBC/fever curve -Continue Zosyn for intra-abdominal coverage & aspiration pneumonia -Vancomycin started 05/06/2020, MRSA PCR positive - f/u cultures -Trend lactic/PCT  Paroxysmal Atrial Fibrillation  Elevated troponin In the setting of bowel perforation/surgical repair PMHx: PE CHA2DS2-VASc score: 2 Echocardiogram completed-see above.  Past 36 hours PAF with runs of VT up to 20 beats, rhythm dramatically improved on amiodarone infusion.  Previous BB IVP was ineffective and dropped blood pressure.  Elevated troponin-peak  at 536 and down trending, suspect demand ischemia - Continue Amiodarone infusion, cardiology recommendation to rebolused with amiodarone for any additional arrhythmias -Home regimen of Eliquis held at this time due to  hematemesis on admission, reports of dark tarry stools & recent abdominal surgery -VTE prophylaxis okayed by general surgery and initiated 05/06/2020 - f/u TSH & thyroid panel - Cardiology consulted, appreciate input - Continuous cardiac monitoring  Perforated bowel -proximal jejunal perforation, with postsurgical leak Upper GI bleed PMHx: Crohn's S/p small bowel resection and JP drain placement for postsurgical leak.  Per general surgery patient has chronically inflamed tissue and previous abdominal surgeries.  This combined with chronic unhealthy bowel tissue in the setting of Crohn's Dr. Lysle Pearl is hoping to avoid another surgery and provide supportive care. 05/06/20-noted decrease of JP output & NGT output.  Dr. Lysle Pearl plans for reimaging 05/07/2020 if patient continues to decline, palliative care consult was also discussed. -Continue octreotide per surgery - Albumin 1.7, replacing with albumin twice daily x 6 doses - LR bolus & IVF, due to concerns of intravascular dehydration -Home regimen will budesonide on hold-Per general surgery  Hypertension -Hypotension in the setting of sedation: Ordered phenylephrine to maintain a MAP > 65.  Discontinuing peripheral Levophed due to recent history of tachyarrhythmias -Home regimen: Lisinopril/hydrochlorothiazide on hold  Goals of care Wife updated by MD regarding emergent intubation and mechanical ventilation.  Due to patient's inability to clear secretions & general surgery's reluctance for additional abdominal surgeries if needed.  Palliative care consult placed for 05/07/2020 to assist in plan of care discussions  Best practice (evaluated daily)  Diet: NPO Pain/Anxiety/Delirium protocol (if indicated): ketorolac & morphine as needed  VAP protocol (if indicated): N/A DVT prophylaxis: heparin SQ - OK'd by Dr. Lysle Pearl GI prophylaxis: protonix drip Glucose control: monitor Q 6 Mobility: bedrest, mobilize as tolerated Disposition: ICU  Goals of Care:    Family and staff present: Wife updated via telephone by MD Summary of discussion: Discussed plan of care and emergent intubation  Code Status: FULL  Labs   CBC: Recent Labs  Lab 05/05/20 0225 05/06/20 0302 05/06/20 1636 05/07/20 0409 05/08/20 0448  WBC 28.8* 20.0* 20.8* 23.2* 31.8*  NEUTROABS 24.4*  --  17.2* 18.2* 23.7*  HGB 8.6* 7.9* 7.8* 7.4* 8.1*  HCT 27.4* 25.3* 24.4* 24.1* 27.5*  MCV 99.3 99.6 97.2 101.3* 104.6*  PLT 185 161 185 177 007    Basic Metabolic Panel: Recent Labs  Lab 05/04/20 2310 05/05/20 0225 05/05/20 0629 05/06/20 0302 05/06/20 1636 05/07/20 0409 05/08/20 0448 05/08/20 0620 05/08/20 1014  NA  --  143 143 146* 143 142 141  --   --   K  --  3.5 4.0 3.3* 3.8 3.5 5.4* 5.7* 6.2*  CL  --  115* 111 113* 115* 113* 113*  --   --   CO2  --  21* _0 --   --   GLUCOSE  --  132* 117* 150* 136* 169* 162*  --   --   BUN  --  _1 30*  --   --   CREATININE  --  0.87 0.88 0.81 0.80 0.68 1.47*  --   --   CALCIUM  --  8.0* 7.8* 7.7* 8.1* 7.5* 7.8*  --   --   MG 1.9 1.9  --  1.9  --  1.9 2.1  --   --   PHOS  --  2.8 2.5  --   --  3.4 4.0  --   --    GFR: Estimated Creatinine Clearance: 52.4 mL/min (A) (by C-G formula based on SCr of 1.47 mg/dL (H)). Recent Labs  Lab 05/04/20 2310 05/05/20 0225 05/05/20 0235 05/05/20 1207 05/06/20 0302 05/06/20 1636 05/06/20 1956 05/07/20 0409 05/08/20 0448  PROCALCITON 0.51 0.46  --   --  1.04  --   --   --   --   WBC  --  28.8*  --   --  20.0* 20.8*  --  23.2* 31.8*  LATICACIDVEN 6.8*  --    < > 4.2*  --  2.4* 2.1* 1.9  --    < > = values in this interval not displayed.    Liver Function Tests: Recent Labs  Lab 05/05/20 0225 05/05/20 0629 05/06/20 0302 05/06/20 1636 05/07/20 0409 05/08/20 0448 05/08/20 1217  AST 14*  --  19 16 13*  --  17  ALT 11  --  _0 --  9  ALKPHOS 38  --  40 41 39  --  52  BILITOT 1.0  --  0.8 1.0 1.0  --  1.1  PROT 4.4*  --  4.2* 4.3* 4.3*  --  3.9*   ALBUMIN 2.0*   < > 1.7* 2.0* 2.1* 1.5* 1.5*   < > = values in this interval not displayed.   Recent Labs  Lab 04/25/2020 0426  LIPASE 20   No results for input(s): AMMONIA in the last 168 hours.  ABG    Component Value Date/Time   PHART 7.27 (L) 05/07/2020 0500   PCO2ART 50 (H) 05/07/2020 0500   PO2ART 88 05/07/2020 0500   HCO3 23.0 05/07/2020 0500   ACIDBASEDEF 3.8 (H) 05/07/2020 0500   O2SAT 95.3 05/07/2020 0500     Coagulation Profile: No results for input(s): INR, PROTIME in the last 168 hours.  Cardiac Enzymes: No results for input(s): CKTOTAL, CKMB, CKMBINDEX, TROPONINI in the last 168 hours.  HbA1C: Hgb A1c MFr Bld  Date/Time Value Ref Range Status  05/05/2020 02:35 AM 5.5 4.8 - 5.6 % Final    Comment:    (NOTE) Pre diabetes:          5.7%-6.4%  Diabetes:              >6.4%  Glycemic control for   <7.0% adults with diabetes     CBG: Recent Labs  Lab 05/07/20 1917 05/07/20 2335 05/08/20 0316 05/08/20 0732 05/08/20 1145  GLUCAP 131* 126* 139* 137* 131*    Review of Systems: Positives in BOLD   UTA-patient intubated and sedated  Past Medical History:  He,  has a past medical history of Cancer ([place]), Crohn's disease ([place]), GERD (gastroesophageal reflux disease), Hypertension, MRSA (methicillin resistant staph aureus) culture positive, Neutrophilia, Pulmonary embolism ([place]) (10/2017), Shingles, and Wears contact lenses.   Surgical History:   Past Surgical History:  Procedure Laterality Date  . ABDOMINAL SURGERY    . BOWEL RESECTION     x 3  . CATARACT EXTRACTION W/PHACO Left 10/18/2018   Procedure: CATARACT EXTRACTION PHACO AND INTRAOCULAR LENS PLACEMENT ([place]) LEFT;  Surgeon: Eulogio Bear, MD;  Location: [facility];  Service: Ophthalmology;  Laterality: Left;  . CATARACT EXTRACTION W/PHACO Right 12/06/2018   Procedure: CATARACT EXTRACTION PHACO AND INTRAOCULAR LENS PLACEMENT ([facility]) RIGHT;  Surgeon: Eulogio Bear, MD;  Location:  [facility];  Service: Ophthalmology;  Laterality: Right;  . HYDROCELE EXCISION / REPAIR    . IMAGE  GUIDED SINUS SURGERY N/A 05/21/2017   Procedure: IMAGE GUIDED SINUS SURGERY;  Surgeon: Margaretha Sheffield, MD;  Location: [facility];  Service: ENT;  Laterality: N/A;  need stryker disk  . LAPAROTOMY N/A 04/09/2020   Procedure: EXPLORATORY LAPAROTOMY;  Surgeon: Benjamine Sprague, DO;  Location: [facility];  Service: General;  Laterality: N/A;  . MAXILLARY ANTROSTOMY Right 05/21/2017   Procedure: MAXILLARY ANTROSTOMY;  Surgeon: Margaretha Sheffield, MD;  Location: [facility];  Service: ENT;  Laterality: Right;  . SEPTOPLASTY N/A 05/21/2017   Procedure: SEPTOPLASTY;  Surgeon: Margaretha Sheffield, MD;  Location: [facility];  Service: ENT;  Laterality: N/A;     Social History:   reports that he has been smoking cigarettes. He has a 23.50 pack-year smoking history. He has never used smokeless tobacco. He reports that he does not drink alcohol and does not use drugs.   Family History:  His family history includes Aneurysm (age of onset: 6) in his father; Dementia (age of onset: 44) in his mother.   Allergies Allergies  Allergen Reactions  . Amlodipine Swelling    feet  . Aspirin Other (See Comments)    Peptic ulcers  . Flagyl [Metronidazole]     Gives neuropathy if taken too long      Home Medications  Prior to Admission medications   Medication Sig Start Date End Date Taking? Authorizing Provider  apixaban (ELIQUIS) 5 MG TABS tablet Take 1 tablet (5 mg total) by mouth 2 (two) times daily. Start 6/19 10/21/17   Bettey Costa, MD  B Complex-Folic Acid (B COMPLEX-VITAMIN B12 PO) Take 1,000 mcg by mouth daily.     [provider]  budesonide (ENTOCORT EC) 3 MG 24 hr capsule Take 6 mg by mouth daily.     [provider]  butalbital-acetaminophen-caffeine (FIORICET) 50-325-40 MG tablet Take 1 tablet by mouth every 4 (four) hours as needed for headache.    [provider]  fluorouracil (EFUDEX) 5 % cream Apply topically. 09/19/19 09/18/20  [provider]  folic acid (FOLVITE) 1 MG tablet Take 1 mg by mouth daily. 06/21/16   [provider]  hyoscyamine (LEVSIN SL) 0.125 MG SL tablet Place 0.125 mg under the tongue every 4 (four) hours as needed.    [provider]  LISINOPRIL-HYDROCHLOROTHIAZIDE PO Take 1 tablet by mouth daily. 5 mg daily    [provider]  loperamide (IMODIUM) 2 MG capsule Take 2 mg by mouth as needed for diarrhea or loose stools.    [provider]  Melatonin 5 MG TABS Take 10 mg by mouth at bedtime as needed.    [provider]  montelukast (SINGULAIR) 10 MG tablet Take 10 mg by mouth every morning.     [provider]  nystatin (MYCOSTATIN) 100000 UNIT/ML suspension Swish and swallow 5 mLs 4 (four) times daily 12/22/19   [provider]  ondansetron (ZOFRAN ODT) 4 MG disintegrating tablet Take 1 tablet (4 mg total) by mouth every 8 (eight) hours as needed for nausea or vomiting. 04/27/18   Darel Hong, MD  pantoprazole (PROTONIX) 40 MG tablet Take 40 mg by mouth 2 (two) times daily. 04/14/16   [provider]  potassium chloride SA (K-DUR,KLOR-CON) 20 MEQ tablet Take 1 tablet by mouth daily.  10/08/17 04/11/20  [provider]  vitamin B-12 (CYANOCOBALAMIN) 1000 MCG tablet Take 1,000 mcg by mouth daily.    [provider]  Vitamin D, Ergocalciferol, (DRISDOL) 50000 units CAPS capsule Take 50,000  Units by mouth every 7 (seven) days.    [provider]  zolpidem (AMBIEN) 10 MG tablet Take 10 mg by mouth at bedtime as needed for sleep. Patient not taking: Reported on 03/27/2020    [provider]     Critical care time: 32 minutes    Critical care provider statement:    Critical care time (minutes):  32   Critical care time was exclusive of:  Separately billable procedures and  treating other patients   Critical  care was necessary to treat or prevent imminent or  life-threatening deterioration of the following conditions:  septic shock due to intraabdominal infection, multiple comorbid conditions.    Critical care was time spent personally by me on the following  activities:  Development of treatment plan with patient or surrogate,  discussions with consultants, evaluation of patient's response to  treatment, examination of patient, obtaining history from patient or  surrogate, ordering and performing treatments and interventions, ordering  and review of laboratory studies and re-evaluation of patient's condition   I assumed direction of critical care for this patient from another  provider in my specialty: no     Ottie Glazier, M.D.  [facility] [place]

## 2020-05-09 ENCOUNTER — Encounter (HOSPITAL_COMMUNITY): Payer: Self-pay | Admitting: Hematology and Oncology

## 2020-05-09 DIAGNOSIS — Z515 Encounter for palliative care: Secondary | ICD-10-CM | POA: Diagnosis not present

## 2020-05-09 DIAGNOSIS — R6521 Severe sepsis with septic shock: Secondary | ICD-10-CM | POA: Diagnosis not present

## 2020-05-09 DIAGNOSIS — E440 Moderate protein-calorie malnutrition: Secondary | ICD-10-CM | POA: Diagnosis not present

## 2020-05-09 DIAGNOSIS — A419 Sepsis, unspecified organism: Secondary | ICD-10-CM

## 2020-05-09 DIAGNOSIS — Z7189 Other specified counseling: Secondary | ICD-10-CM | POA: Diagnosis not present

## 2020-05-09 DIAGNOSIS — J9601 Acute respiratory failure with hypoxia: Secondary | ICD-10-CM | POA: Diagnosis not present

## 2020-05-09 LAB — CBC WITH DIFFERENTIAL/PLATELET
Abs Immature Granulocytes: 3.71 10*3/uL — ABNORMAL HIGH (ref 0.00–0.07)
Basophils Absolute: 0.2 10*3/uL — ABNORMAL HIGH (ref 0.0–0.1)
Basophils Relative: 1 %
Eosinophils Absolute: 0 10*3/uL (ref 0.0–0.5)
Eosinophils Relative: 0 %
HCT: 22.7 % — ABNORMAL LOW (ref 39.0–52.0)
Hemoglobin: 6.6 g/dL — ABNORMAL LOW (ref 13.0–17.0)
Immature Granulocytes: 11 %
Lymphocytes Relative: 5 %
Lymphs Abs: 1.8 10*3/uL (ref 0.7–4.0)
MCH: 31 pg (ref 26.0–34.0)
MCHC: 29.1 g/dL — ABNORMAL LOW (ref 30.0–36.0)
MCV: 106.6 fL — ABNORMAL HIGH (ref 80.0–100.0)
Monocytes Absolute: 1.2 10*3/uL — ABNORMAL HIGH (ref 0.1–1.0)
Monocytes Relative: 3 %
Neutro Abs: 27.8 10*3/uL — ABNORMAL HIGH (ref 1.7–7.7)
Neutrophils Relative %: 80 %
Platelets: 171 10*3/uL (ref 150–400)
RBC: 2.13 MIL/uL — ABNORMAL LOW (ref 4.22–5.81)
RDW: 17.2 % — ABNORMAL HIGH (ref 11.5–15.5)
Smear Review: NORMAL
WBC: 34.6 10*3/uL — ABNORMAL HIGH (ref 4.0–10.5)
nRBC: 2.9 % — ABNORMAL HIGH (ref 0.0–0.2)

## 2020-05-09 LAB — RENAL FUNCTION PANEL
Albumin: 1.2 g/dL — ABNORMAL LOW (ref 3.5–5.0)
Anion gap: 8 (ref 5–15)
BUN: 46 mg/dL — ABNORMAL HIGH (ref 8–23)
CO2: 21 mmol/L — ABNORMAL LOW (ref 22–32)
Calcium: 8.4 mg/dL — ABNORMAL LOW (ref 8.9–10.3)
Chloride: 109 mmol/L (ref 98–111)
Creatinine, Ser: 3.04 mg/dL — ABNORMAL HIGH (ref 0.61–1.24)
GFR, Estimated: 22 mL/min — ABNORMAL LOW (ref >60)
Glucose, Bld: 367 mg/dL — ABNORMAL HIGH (ref 70–99)
Phosphorus: 6.8 mg/dL — ABNORMAL HIGH (ref 2.5–4.6)
Potassium: 6 mmol/L — ABNORMAL HIGH (ref 3.5–5.1)
Sodium: 138 mmol/L (ref 135–145)

## 2020-05-09 LAB — POTASSIUM: Potassium: 6.5 mmol/L (ref 3.5–5.1)

## 2020-05-09 LAB — CULTURE, BLOOD (ROUTINE X 2)
Culture: NO GROWTH
Special Requests: ADEQUATE

## 2020-05-09 LAB — SURGICAL PATHOLOGY

## 2020-05-09 LAB — GLUCOSE, CAPILLARY
Glucose-Capillary: 271 mg/dL — ABNORMAL HIGH (ref 70–99)
Glucose-Capillary: 304 mg/dL — ABNORMAL HIGH (ref 70–99)

## 2020-05-09 LAB — T3, FREE: T3, Free: 1.1 pg/mL — ABNORMAL LOW (ref 2.0–4.4)

## 2020-05-09 LAB — MAGNESIUM: Magnesium: 2.3 mg/dL (ref 1.7–2.4)

## 2020-05-09 MED ORDER — MORPHINE BOLUS VIA INFUSION
2.0000 mg | INTRAVENOUS | Status: DC | PRN
  Filled 2020-05-09: qty 2

## 2020-05-09 MED ORDER — MORPHINE 100MG IN NS 100ML (1MG/ML) PREMIX INFUSION
4.0000 mg/h | INTRAVENOUS | Status: DC
  Administered 2020-05-09: 5 mg/h via INTRAVENOUS
  Filled 2020-05-09: qty 100

## 2020-05-09 MED ORDER — MORPHINE BOLUS VIA INFUSION
2.0000 mg | INTRAVENOUS | Status: DC | PRN
  Filled 2020-05-09: qty 4

## 2020-05-09 MED ORDER — POLYVINYL ALCOHOL 1.4 % OP SOLN
1.0000 [drp] | Freq: Four times a day (QID) | OPHTHALMIC | Status: DC | PRN
  Filled 2020-05-09: qty 15

## 2020-05-09 MED ORDER — LORAZEPAM 2 MG/ML IJ SOLN
2.0000 mg | INTRAMUSCULAR | Status: DC | PRN
  Administered 2020-05-09: 2 mg via INTRAVENOUS
  Filled 2020-05-09: qty 1

## 2020-05-09 MED ORDER — SODIUM BICARBONATE 8.4 % IV SOLN
50.0000 meq | Freq: Once | INTRAVENOUS | Status: AC
  Administered 2020-05-09: 50 meq via INTRAVENOUS
  Filled 2020-05-09: qty 50

## 2020-05-09 MED ORDER — GLYCOPYRROLATE 0.2 MG/ML IJ SOLN: 0.4000 mg | INTRAMUSCULAR | Status: DC | PRN

## 2020-05-09 MED ORDER — CALCIUM GLUCONATE-NACL 1-0.675 GM/50ML-% IV SOLN
1.0000 g | Freq: Once | INTRAVENOUS | Status: AC
  Administered 2020-05-09: 1000 mg via INTRAVENOUS
  Filled 2020-05-09 (×2): qty 50

## 2020-05-09 MED ORDER — LINEZOLID 600 MG/300ML IV SOLN
600.0000 mg | Freq: Two times a day (BID) | INTRAVENOUS | Status: DC
  Filled 2020-05-09 (×2): qty 300

## 2020-05-09 MED ORDER — DEXTROSE 50 % IV SOLN
1.0000 | Freq: Once | INTRAVENOUS | Status: AC
  Administered 2020-05-09: 50 mL via INTRAVENOUS
  Filled 2020-05-09 (×2): qty 50

## 2020-05-09 MED ORDER — GLYCOPYRROLATE 0.2 MG/ML IJ SOLN: 0.4000 mg | INTRAMUSCULAR | Status: DC

## 2020-05-09 MED ORDER — INSULIN ASPART 100 UNIT/ML ~~LOC~~ SOLN
10.0000 [IU] | Freq: Once | SUBCUTANEOUS | Status: AC
  Administered 2020-05-09: 10 [IU] via INTRAVENOUS
  Filled 2020-05-09: qty 1

## 2020-05-10 LAB — CULTURE, RESPIRATORY W GRAM STAIN

## 2020-05-10 LAB — CULTURE, BLOOD (ROUTINE X 2)
Culture: NO GROWTH
Special Requests: ADEQUATE

## 2020-06-05 NOTE — Progress Notes (Signed)
  Chaplain On-Call was paged by [employer] who reported the patient's family has requested prayer as patient is transitioned to comfort care.  Chaplain received update from [place], and met family at bedside.  Chaplain learned from patient's wife Brent Diaz and his sister Brent Diaz about the course of patient's hospitalization, and how sudden it feels to them, despite several lengthy medical problems for the patient.  Chaplain provided spiritual and emotional support and prayer. Family stated their appreciation.  Pablo Leander Tout M.Div., [facility]

## 2020-06-05 NOTE — Progress Notes (Signed)
CRITICAL CARE NOTE 65 year old male with Crohn's disease admitted with closed-loop bowel obstruction status post ex lap and small bowel resection for proximal jejunal perforation now with suspected left lower lobe aspiration pneumonia.  History of Present Illness:  65 year old male with significant history of Crohn's disease and previous small bowel resection, presented to the ED with suprapubic abdominal pain.  Patient reported sharp LUQ pain without radiation, associated with nausea and vomiting and exacerbated by touch.  Patient had bloody vomitus while in the ED and reported an episode of dark tarry stools on 05/02/2020.  Patient reported that he is on Eliquis for previous pulmonary embolism.  ED course: CT abdomen showed dilated jejunum and possible internal hernia and/or closed-loop bowel obstruction. Labs: WBC 32.8, lactic 3.8 & Hgb stable at 12.6. Patient was taken emergently to the OR on 04/04/2020.  Postop course complicated by bile leak noted on 05/04/2020, given location of perforation and prior abdominal surgeries in the setting of baseline Crohn's surgery is attempting to avoid more procedures.    EVENTS:  On 05/05/2020 patient developed narrow complex tachycardia overnight, with elevated troponin at 536~474 and complaints of shortness of breath.  CTa negative for PE, but showed new left lower lobe pneumonia.  Lactate increased to 6.8, down to 2.3 after fluid boluses.   On 05/06/2020 patient again developed shortness of breath unable to clear secretions, requiring NT suctioning by respiratory therapy, ultimately requiring rapid intubation and mechanical ventilation.  PCCM consulted for further management and monitoring.  05/08/19- Patient on vasopressor support in septic shock from intraabdominal source of infection due to perforated diverticulitis.  Palliative consultation is in process.  Stopped IV LR today, dcd albumin and amio gtt to decrease volume of infusions and delivered  digoxin load. Will add vasopressin to decrease neosynephrine as it is arrythmogenic and contributing to episodes of tachyarrythmia and AFrVR.   05/09/19- patient remains critically ill septic.  He is very sensitive to pressors and to minimize the hypotension will use ketamine for sedation instead of propofol.  Hospital is nearing end of Precedex supply today.  Placed ID consultation due to multi organ failure.   Have discussed with Dr Lysle Pearl no additional plan for surgery.  Palliative care met with family today.    Past Medical History:  Crohn's Pulmonary embolism - 10/2017 Hypertension GERD Cancer -skin Significant Hospital Events:  05/02/2020-admit to stepdown unit after emergent ex lap with small bowel resection for proximal jejunal perforation 05/04/20-bile leak noted, JP drain in place 05/05/20-narrow complex tachycardia, CTA negative for PE, new LLL pneumonia  Consults:  Cardiology Hospitalist  Procedures:  04/05/2020-ex lap and small bowel resection for proximal jejunal perforation 04/24/2020 - CVC 2 L RIJ>> 05/06/20 - ETT >> 05/06/20 - CVC 3 L R femoral >> Significant Diagnostic Tests:  04/16/2020 CT abdomen w contrast > findings suspicious for closed-loop proximal jejunal obstruction due to an internal hernia.  Mild wall thickening involving the jejunum distal to the focally dilated loop of jejunum in the left mid abdomen.  Findings suspicious for chronic and acute diverticulitis involving the proximal and mid sigmoid colon with associated interval irregular adjacent soft tissue mass measuring 5.4 x 4.6 x 3.4 cm.  Small to moderate amount of free peritoneal fluid.  1.2 cm medium density exophytic mass arising from the upper pole of the left kidney.  Mild diffuse hepatic steatosis. 05/04/2020 CTA chest > no pulmonary embolism, dense consolidation of the left lower lobe with extensive endobronchial debris.  Emphysema noted 05/05/2020 CT abdomen wo contrast >  postoperative changes in the region  of the jejunum, continued inflammation and extraluminal locules of gas adjacent to the sigmoid colon concerning for diverticulitis and contained perforation.  No bowel obstruction 05/05/2020 Echocardiogram > LVEF 55 to 60%, no regional wall motion abnormalities, no LVH.  Mild TR, all else WNL. Micro Data:  04/29/2020 COVID-19 > negative 04/26/2020 influenza A/B > negative 05/05/2020 BC x 2 >> 05/06/2020 Tracheal aspirate >>  Antimicrobials:  04/21/2020 Zosyn >> 05/06/2020 Vancomycin >>       CC  follow up respiratory failure  SUBJECTIVE Patient remains critically ill Prognosis is guarded   BP (!) 113/56   Pulse 90   Temp 98.4 F (36.9 C) (Axillary)   Resp 19   Ht 5' 10"  (1.778 m)   Wt 83.5 kg   SpO2 94%   BMI 26.41 kg/m    I/O last 3 completed shifts: In: 5889.6 [I.V.:5198.9; IV Piggyback:690.8] Out: 77 [Urine:700; Emesis/NG output:250; Drains:455] No intake/output data recorded.  SpO2: 94 % O2 Flow Rate (L/min): 4 L/min FiO2 (%): 50 %  Estimated body mass index is 26.41 kg/m as calculated from the following:   Height as of this encounter: 5' 10"  (1.778 m).   Weight as of this encounter: 83.5 kg.  SIGNIFICANT EVENTS   REVIEW OF SYSTEMS  PATIENT IS UNABLE TO PROVIDE COMPLETE REVIEW OF SYSTEMS DUE TO SEVERE CRITICAL ILLNESS        PHYSICAL EXAMINATION:  GENERAL:critically ill appearing, +resp distress HEAD: Normocephalic, atraumatic.  EYES: Pupils equal, round, reactive to light.  No scleral icterus.  MOUTH: Moist mucosal membrane. NECK: Supple.  PULMONARY: +rhonchi, +wheezing CARDIOVASCULAR: S1 and S2. Regular rate and rhythm. No murmurs, rubs, or gallops.  GASTROINTESTINAL: Soft, nontender, -distended.  Positive bowel sounds.   MUSCULOSKELETAL: No swelling, clubbing, or edema.  NEUROLOGIC: obtunded, GCS<8 SKIN:intact,warm,dry  MEDICATIONS: I have reviewed all medications and confirmed regimen as documented   CULTURE RESULTS   Recent  Results (from the past 240 hour(s))  Resp Panel by RT-PCR (Flu A&B, Covid) Nasopharyngeal Swab     Status: None   Collection Time: 04/13/2020 10:40 AM   Specimen: Nasopharyngeal Swab; Nasopharyngeal(NP) swabs in vial transport medium  Result Value Ref Range Status   SARS Coronavirus 2 by RT PCR NEGATIVE NEGATIVE Final    Comment: (NOTE) SARS-CoV-2 target nucleic acids are NOT DETECTED.  The SARS-CoV-2 RNA is generally detectable in upper respiratory specimens during the acute phase of infection. The lowest concentration of SARS-CoV-2 viral copies this assay can detect is 138 copies/mL. A negative result does not preclude SARS-Cov-2 infection and should not be used as the sole basis for treatment or other patient management decisions. A negative result may occur with  improper specimen collection/handling, submission of specimen other than nasopharyngeal swab, presence of viral mutation(s) within the areas targeted by this assay, and inadequate number of viral copies(<138 copies/mL). A negative result must be combined with clinical observations, patient history, and epidemiological information. The expected result is Negative.  Fact Sheet for Patients:  EntrepreneurPulse.com.au  Fact Sheet for Healthcare Providers:  IncredibleEmployment.be  This test is no t yet approved or cleared by the [place] FDA and  has been authorized for detection and/or diagnosis of SARS-CoV-2 by FDA under an Emergency Use Authorization (EUA). This EUA will remain  in effect (meaning this test can be used) for the duration of the COVID-19 declaration under Section 564(b)(1) of the Act, 21 U.S.C.section 360bbb-3(b)(1), unless the authorization is terminated  or revoked sooner.  Influenza A by PCR NEGATIVE NEGATIVE Final   Influenza B by PCR NEGATIVE NEGATIVE Final    Comment: (NOTE) The Xpert Xpress SARS-CoV-2/FLU/RSV plus assay is intended as an aid in the  diagnosis of influenza from Nasopharyngeal swab specimens and should not be used as a sole basis for treatment. Nasal washings and aspirates are unacceptable for Xpert Xpress SARS-CoV-2/FLU/RSV testing.  Fact Sheet for Patients: EntrepreneurPulse.com.au  Fact Sheet for Healthcare Providers: IncredibleEmployment.be  This test is not yet approved or cleared by the [place] FDA and has been authorized for detection and/or diagnosis of SARS-CoV-2 by FDA under an Emergency Use Authorization (EUA). This EUA will remain in effect (meaning this test can be used) for the duration of the COVID-19 declaration under Section 564(b)(1) of the Act, 21 U.S.C. section 360bbb-3(b)(1), unless the authorization is terminated or revoked.  Performed at [facility], [address] [place], [place] [address]   CULTURE, BLOOD (ROUTINE X 2) w Reflex to ID Panel     Status: None   Collection Time: 05/04/20 11:27 PM   Specimen: BLOOD  Result Value Ref Range Status   Specimen Description BLOOD BLOOD RIGHT FOREARM  Final   Special Requests   Final    BOTTLES DRAWN AEROBIC AND ANAEROBIC Blood Culture adequate volume   Culture   Final    NO GROWTH 5 DAYS Performed at [facility], [address] [place], [place] [address]    Report Status 05/29/2020 FINAL  Final  CULTURE, BLOOD (ROUTINE X 2) w Reflex to ID Panel     Status: None (Preliminary result)   Collection Time: 05/05/20  6:29 AM   Specimen: BLOOD  Result Value Ref Range Status   Specimen Description BLOOD RIGHT [facility]  Final   Special Requests   Final    BOTTLES DRAWN AEROBIC AND ANAEROBIC Blood Culture adequate volume   Culture   Final    NO GROWTH 4 DAYS Performed at [facility], [address], [place], [place] [address]    Report Status PENDING  Incomplete  MRSA PCR Screening     Status: Abnormal   Collection Time: 05/06/20  5:21 PM   Specimen: Nasal Mucosa;  Nasopharyngeal  Result Value Ref Range Status   MRSA by PCR POSITIVE (A) NEGATIVE Final    Comment:        The GeneXpert MRSA Assay (FDA approved for NASAL specimens only), is one component of a comprehensive MRSA colonization surveillance program. It is not intended to diagnose MRSA infection nor to guide or monitor treatment for MRSA infections. RESULT CALLED TO, READ BACK BY AND VERIFIED WITH: EMILY GANNON 05/06/20 AT 1912 BY ACR Performed at [facility], [address] [place], [place] [address]   Culture, respiratory (non-expectorated)     Status: None (Preliminary result)   Collection Time: 05/06/20  7:37 PM   Specimen: Tracheal Aspirate; Respiratory  Result Value Ref Range Status   Specimen Description   Final    TRACHEAL ASPIRATE Performed at [facility], [address], [place], [place] [address]    Special Requests   Final    NONE Performed at [facility], [address] [place], [place] [address]    Gram Stain   Final    ABUNDANT WBC PRESENT, PREDOMINANTLY PMN MODERATE GRAM POSITIVE COCCI    Culture   Final    ABUNDANT STAPHYLOCOCCUS AUREUS SUSCEPTIBILITIES TO FOLLOW Performed at [facility], [place] [address] [place], [place] [address]  Report Status PENDING  Incomplete          IMAGING    No results found.   Nutrition Status: Nutrition Problem: Moderate Malnutrition Etiology: chronic illness (Crohn's disease) Signs/Symptoms: moderate fat depletion,moderate muscle depletion,severe muscle depletion Interventions: TPN     Indwelling Urinary Catheter continued, requirement due to   Reason to continue Indwelling Urinary Catheter strict Intake/Output monitoring for hemodynamic instability   Central Line/ continued, requirement due to  Reason to continue [place] of central venous pressure or other hemodynamic parameters and poor IV access   Ventilator continued, requirement due to severe  respiratory failure   Ventilator Sedation RASS 0 to -2      ASSESSMENT AND PLAN SYNOPSIS Acute Hypoxic / Hypercapnic Respiratory Failure  Secondary to / in the setting of LLL Pneumonia & Abdominal Surgery-S/p ex-lap, proximal jejunal resection and re-anastamosis for perforted bowel secondary to crohn's stricture and pressure buildup from upper GI bleed  Unfortunately already leaking bile, likely due to the chronically inflammed tissue.  Continued deterioration with increasing need  For pressor support. Suspected Aspiration Pneumonia new diagnosis of Colon cancer   Severe ACUTE Hypoxic and Hypercapnic Respiratory Failure -continue Full MV support -continue Bronchodilator Therapy -Wean Fio2 and PEEP as tolerated -VAP/VENT bundle implementation  Morbid obesity, possible OSA.   Will certainly impact respiratory mechanics, ventilator weaning Suspect will need to consider additional PEEP  ACUTE KIDNEY INJURY/Renal Failure -continue Foley Catheter-assess need -Avoid nephrotoxic agents -Follow urine output, BMP -Ensure adequate renal perfusion, optimize oxygenation -Renal dose medications     NEUROLOGY Acute toxic metabolic encephalopathy, need for sedation Goal RASS -2 to -3  SHOCK-SEPSIS -use vasopressors to keep MAP>65   CARDIAC ICU monitoring  ID -continue IV abx as prescibed -follow up cultures  GI GI PROPHYLAXIS as indicated  NUTRITIONAL STATUS Nutrition Status: Nutrition Problem: Moderate Malnutrition Etiology: chronic illness (Crohn's disease) Signs/Symptoms: moderate fat depletion,moderate muscle depletion,severe muscle depletion Interventions: TPN   DIET-->TPN Constipation protocol as indicated  ENDO - will use ICU hypoglycemic\Hyperglycemia protocol if indicated     ELECTROLYTES -follow labs as needed -replace as needed -pharmacy consultation and following   DVT/GI PRX ordered and assessed TRANSFUSIONS AS NEEDED MONITOR FSBS I Assessed  the need for Labs I Assessed the need for Foley I Assessed the need for Central Venous Line Family Discussion when available I Assessed the need for Mobilization I made an Assessment of medications to be adjusted accordingly Safety Risk assessment completed   CASE DISCUSSED IN MULTIDISCIPLINARY ROUNDS WITH ICU TEAM  Critical Care Time devoted to patient care services described in this note is 56 minutes.   Overall, patient is critically ill, prognosis is guarded.  Patient with Multiorgan failure and at high risk for cardiac arrest and death.    Corrin Parker, M.D.  [facility]  Medical Director [place] Director [facility] Cardio-Pulmonary Department

## 2020-06-05 NOTE — Progress Notes (Signed)
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  I relayed this information to patient's wife, explaining to her that if her husband recovers from this most recent incident, he will likely need additional extensive care in order to deal with this new diagnosis.  I explained to her that his current health condition still remains critical, and overall quality of life moving forward with this new diagnosis can be further limited.  In addition to the cancer discussion above, infectious disease had concerns of possible persistent diverticulitis despite antibiotic use, and recommended a CT scan to ensure there is no abscess formation in that area.  I explained to her that Intra-Op, with no obvious abscess formation or specific signs of diverticulitis, that the CT scan findings are likely an extension of the feculent peritonitis that was caused by the small bowel perforation.  However, due to his continued decline in clinical status, I explained to the wife that if she wishes to continue treatment, we can go ahead and obtain a CT scan, understanding it still may not show any additional pathology.  In addition, also explained to her that due to his critical state, even if an abscess is noted on the CT scan, he may be too unstable to proceed with any sort of procedure to drain this abscess.  After an extension discussion of above with the wife, she stated that her main goal is to make sure her husband remains comfortable, and honor his wishes of not being vent dependent for an extended period of time.  She specifically stated that she would like to discuss things over with the palliative care team again in the morning.  I ended the conversation by making sure she had no further questions or concerns regarding his clinical status, and reassured her that we will honor her wishes as we continue to provide care for her husband.

## 2020-06-05 NOTE — Progress Notes (Signed)
Inpatient Diabetes Program Recommendations  AACE/ADA: New Consensus Statement on Inpatient Glycemic Control (2015)  Target Ranges:  Prepandial:   less than 140 mg/dL      Peak postprandial:   less than 180 mg/dL (1-2 hours)      Critically ill patients:  140 - 180 mg/dL   Lab Results  Component Value Date   GLUCAP 304 (H) 10-May-2020   HGBA1C 5.5 05/05/2020    Review of Glycemic Control Results for [facility], ROSENWALD (MRN [id number]) as of 05-10-20 08:48  Ref. Range 05/08/2020 19:26 05/08/2020 23:33 2020/05/10 03:53 05-10-2020 07:33  Glucose-Capillary Latest Ref Range: 70 - 99 mg/dL 136 (H) 214 (H) 271 (H) 304 (H)   Diabetes history: None Current orders for Inpatient glycemic control:  Novolog sensitive q 4 hours TPN 100 cc/ hr  Solucortef 50 mg IV q 6 hours Inpatient Diabetes Program Recommendations:    Note that blood sugars increased with TPN and steroids.  Consider increasing Novolog correction to resistant while on steroids/TPN.  May need to add some insulin to TPN.  When steroids are stopped, will need to reduce correction scale.    Thanks  Adah Perl, RN, BC-ADM Inpatient Diabetes Coordinator Pager [phone] (8a-5p)

## 2020-06-05 NOTE — Progress Notes (Signed)
Pt extubated to comfort care.  

## 2020-06-05 NOTE — Progress Notes (Signed)
Palliative:  HPI: 65 y.o. male  with past medical history of Crohn's disease, bowel resection x 3, cancer, GERD, hypertension, MRSA, PE on Eliquis, shingles, current nicotine abuse admitted on 04/18/2020 with LUQ sharp pain with nausea/vomiting. S/P exploratory lap with proximal jejunal resection and re-anastamosis for perforated bowel secondary to Crohn's stricture and pressure buildup from GIB (per surgery). He continues with poor wound healing with chronic inflammation and leaking bile and requiring ICU admission with ventilator, vasopressor support, NGT decompression, JP drain to control leak, and octreotide to decreased output. Also with LLL pneumonia and requiring 100% FiO2.   I met today at Brent Diaz's bedside with wife, Hassan Rowan, and sister, Zigmund Daniel. Discussed further with them moving forward. They have learned since our conversation yesterday that he does have a diagnosis of cancer as well. This solidifies the decision that he would not want to be continued in his current state and they wish to focus on comfort care. We discussed ensuring his comfort and then proceeding with taking him off of the breathing machine and BP medication that is prolonging his life and his suffering. We called chaplain to bedside for visit and prayer prior to extubation.   I was present for extubation at 1055 when Brent Diaz was extubated to comfort and he remained comfortable. I explained to family at bedside expectations at end of life and that he is declining quickly. I remained at bedside until Brent Diaz died. Time of death 56 with asystole, no heart sounds, breath sounds or movements, no pulse. Discussed with Chloe RN, Dr. Mortimer Fries, and Dr. Lysle Pearl.   All questions/concerns addressed. Emotional support.   Exam: Sedated on vent. Ill-appearing. No distress. Appears comfortable on pressure support and remained so after extubation with slow, shallow breathes. Abd soft and dressings clean and intact.   Plan: - Extubated to  comfort. - Time of death 0321.   17 min   Vinie Sill, NP Palliative Medicine Team Pager [phone] (Please see [email or web address] for schedule) Team Phone [phone]    Greater than 50%  of this time was spent counseling and coordinating care related to the above assessment and plan

## 2020-06-05 NOTE — Death Summary Note (Signed)
DEATH SUMMARY   Patient Details  Name: Brent Diaz MRN: [id number] DOB: 1956-04-17  Admission/Discharge Information   Admit Date:  29-May-2020  Date of Death:   2020/06/04   Time of Death:  1108AM  Length of Stay: 6  Referring Physician: Lynnell Jude, MD   Reason(s) for Hospitalization  65 year old male with Crohn's disease admitted with closed-loop bowel obstruction status post ex lap and small bowel resection for proximal jejunal perforation now with suspected left lower lobe aspiration pneumonia.  History of Present Illness:  65 year old male with significant history of Crohn's disease and previous small bowel resection, presented to the ED with suprapubic abdominal pain. Patient reported sharp LUQ pain without radiation, associated with nausea and vomiting and exacerbated by touch. Patient had bloody vomitus while in the ED and reported an episode of dark tarry stools on 05/02/2020. Patient reported that he is on Eliquis for previous pulmonary embolism.  ED course: CT abdomen showed dilated jejunum and possible internal hernia and/or closed-loop bowel obstruction. Labs: WBC 32.8, lactic 3.8 & Hgb stable at 12.6. Patient was taken emergently to the OR on 05-29-2020. Postop course complicated by bile leak noted on 05/04/2020, given location of perforation and prior abdominal surgeries in the setting of baseline Crohn's surgery is attempting to avoid more procedures.   Acute Hypoxic / Hypercapnic Respiratory Failure  Secondary to / in the setting of LLL Pneumonia & Abdominal Surgery-S/p ex-lap, proximal jejunal resection and re-anastamosis for perforted bowel secondary to crohn's stricture and pressure buildup from upper GI bleed  Unfortunately already leaking bile, likely due to the chronically inflammed tissue.Continued deterioration with increasing need For pressor support. Suspected Aspiration Pneumonia new diagnosis of Colon cancer   EVENTS:  On 05/05/2020 patient  developed narrow complex tachycardia overnight, with elevated troponin at 536~474 and complaints of shortness of breath. CTa negative for PE, but showed new left lower lobe pneumonia. Lactate increased to 6.8, down to 2.3 after fluid boluses.   On 05/06/2020 patient again developed shortness of breath unable to clear secretions, requiring NT suctioning by respiratory therapy, ultimately requiring rapid intubation and mechanical ventilation. PCCM consulted for further management and monitoring.  05/08/19-Patient on vasopressor support in septic shock from intraabdominal source of infection due to perforated diverticulitis. Palliative consultation is in process. Stopped IV LR today, dcd albumin and amio gtt to decrease volume of infusions and delivered digoxin load. Will add vasopressin to decrease neosynephrine as it is arrythmogenic and contributing to episodes of tachyarrythmia and AFrVR.   05/09/19-patient remains critically ill septic. He is very sensitive to pressors and to minimize the hypotension will use ketamine for sedation instead of propofol. Hospital is nearing end of Precedex supply today. Placed ID consultation due to multi organ failure. Have discussed with Dr Lysle Pearl no additional plan for surgery. Palliative care met with family today.    Past Medical History:  Crohn's Pulmonary embolism - 10/2017 Hypertension GERD Cancer -skin Significant Hospital Events:  05/29/20-admit to stepdown unit after emergent ex lap with small bowel resection for proximal jejunal perforation 05/04/20-bile leak noted, JP drain in place 05/05/20-narrow complex tachycardia, CTA negative for PE, new LLL pneumonia  Consults:  Cardiology Hospitalist  Procedures:  05-29-2020-ex lap and small bowel resection for proximal jejunal perforation 2020/05/29 - CVC 2 L RIJ>> 05/06/20 - ETT >> 05/06/20 - CVC 3 L R femoral >> Significant Diagnostic Tests:  29-May-2020 CT abdomen w contrast>findings  suspicious for closed-loop proximal jejunal obstruction due to an internal hernia. Mild wall thickening involving  the jejunum distal to the focally dilated loop of jejunum in the left mid abdomen. Findings suspicious for chronic and acute diverticulitis involving the proximal and mid sigmoid colon with associated interval irregular adjacent soft tissue mass measuring 5.4 x 4.6 x 3.4 cm. Small to moderate amount of free peritoneal fluid. 1.2 cm medium density exophytic mass arising from the upper pole of the left kidney. Mild diffuse hepatic steatosis. 05/04/2020 CTA chest >no pulmonary embolism, dense consolidation of the left lower lobe with extensive endobronchial debris. Emphysema noted 05/05/2020 CT abdomen wo contrast >postoperative changes in the region of the jejunum, continued inflammation and extraluminal locules of gas adjacent to the sigmoid colon concerning for diverticulitis and contained perforation. No bowel obstruction 05/05/2020 Echocardiogram >LVEF 55 to 60%, no regional wall motion abnormalities, no LVH. Mild TR, all else WNL. Micro Data:  04/10/2020 COVID-19 > negative 04/11/2020 influenza A/B > negative 05/05/2020 BC x 2 >> 05/06/2020 Tracheal aspirate >>  Antimicrobials:  04/16/2020 Zosyn >> 05/06/2020 Vancomycin >>   GOALS OF CARE DISCUSSION  The Clinical status was relayed to family in detail.  Updated and notified of patients medical condition.  Patient remains unresponsive and will not open eyes to command.    Explained to family course of therapy and the modalities     Patient with Progressive multiorgan failure with very low chance of meaningful recovery despite all aggressive and optimal medical therapy. Patient is in the Dying  Process associated with Suffering.  Family understands the situation.  They have consented and agreed to DNR/DNI and would like to proceed with Comfort care measures.  Family are satisfied with Plan of action and  management. All questions answered    Diagnoses  Preliminary cause of death: bowel perforation, adenocarcinoma of colon Secondary Diagnoses (including complications and co-morbidities):  Active Problems:   Leukocytosis   Perforated bowel (HCC)   Malnutrition of moderate degree   Aspiration pneumonia (HCC)   Acute respiratory failure (HCC)   Septic shock (HCC)   Goals of care, counseling/discussion   Palliative care by specialist    Pertinent Labs and Studies  Significant Diagnostic Studies CT ABDOMEN PELVIS WO CONTRAST  Result Date: 05/05/2020 CLINICAL DATA:  Abdominal pain, fever.  Postop jejunal perforation EXAM: CT ABDOMEN AND PELVIS WITHOUT CONTRAST TECHNIQUE: Multidetector CT imaging of the abdomen and pelvis was performed following the standard protocol without IV contrast. COMPARISON:  05/01/2020 FINDINGS: Lower chest: Consolidation posteriorly in the left lower lobe. Trace left pleural effusion. Heart is normal size. Right base atelectasis. Hepatobiliary: Layering high-density material within the gallbladder may reflect vicarious excretion of bowel. No focal hepatic abnormality. Pancreas: No focal abnormality or ductal dilatation. Spleen: Calcifications throughout the spleen.  Normal size. Adrenals/Urinary Tract: Small hyperdense lesion off the upper pole of the right kidney measures 2 cm, likely hemorrhagic cyst, stable. Small hyperdense lesion off the upper pole of the left kidney measures 1.2 cm, stable. IV contrast noted within the collecting systems from prior contrast CT. Adrenal glands and urinary bladder unremarkable. No hydronephrosis. Stomach/Bowel: Postoperative changes in the jejunum. No current evidence of bowel obstruction. Sigmoid diverticulosis. Continued inflammation around the sigmoid colon with soft tissue thickening and stranding around the sigmoid colon along with locules of extraluminal gas concerning for diverticulitis and perforation. Findings are similar to  prior study. NG tube in the stomach. Vascular/Lymphatic: Aortic atherosclerosis. No evidence of aneurysm or adenopathy. Reproductive: No visible focal abnormality. Other: Small amount of free air in the upper abdomen, likely postoperative. Surgical drain noted  in the left abdomen. Musculoskeletal: No acute bony abnormality. IMPRESSION: Postoperative changes in the region of the jejunum. Continued inflammation and extraluminal locules of gas adjacent to the sigmoid colon concerning for diverticulitis and contained perforation. This is unchanged since prior study. No bowel obstruction. Electronically Signed   By: Rolm Baptise M.D.   On: 05/05/2020 10:09   CT ANGIO CHEST PE W OR WO CONTRAST  Result Date: 05/05/2020 CLINICAL DATA:  Pulmonary embolism, status post small bowel resection pod # 1 EXAM: CT ANGIOGRAPHY CHEST WITH CONTRAST TECHNIQUE: Multidetector CT imaging of the chest was performed using the standard protocol during bolus administration of intravenous contrast. Multiplanar CT image reconstructions and MIPs were obtained to evaluate the vascular anatomy. CONTRAST:  24m OMNIPAQUE IOHEXOL 350 MG/ML SOLN COMPARISON:  10/14/2017, 04/20/2020 FINDINGS: Cardiovascular: Satisfactory opacification of the pulmonary arteries to the segmental level. No evidence of pulmonary embolism. Normal heart size. No pericardial effusion. The thoracic aorta is unremarkable. Right internal jugular central venous catheter tip is seen within the proximal superior vena cava. Mediastinum/Nodes: Visualized thyroid is unremarkable. No pathologic thoracic adenopathy. Nasogastric tube extends into the proximal body of the stomach. The esophagus is otherwise unremarkable. Lungs/Pleura: There is dense consolidation of the left lower lobe in keeping with changes of acute lobar pneumonia. There is extensive endobronchial debris within the a left lower lobe segmental bronchi. Superimposed mild to moderate centrilobular emphysema. No  pneumothorax. Trace left pleural effusion. Upper Abdomen: At least mild hepatic steatosis noted. Surgical drain is partially visualized within the epigastric region. Punctate free intraperitoneal gas is likely postsurgical in nature. Musculoskeletal: No acute bone abnormality. Review of the MIP images confirms the above findings. IMPRESSION: No pulmonary embolism. Dense consolidation of the left lower lobe with extensive endobronchial debris in keeping with aspiration or infection. Moderate centrilobular emphysema. Emphysema (ICD10-J43.9). Electronically Signed   By: AFidela SalisburyMD   On: 05/05/2020 00:13   CT Abdomen Pelvis W Contrast  Result Date: 04/08/2020 CLINICAL DATA:  Severe abdominal pain for the past 3 hours. Two bowel movements this morning. Previous bowel resection. Crohn's disease. EXAM: CT ABDOMEN AND PELVIS WITH CONTRAST TECHNIQUE: Multidetector CT imaging of the abdomen and pelvis was performed using the standard protocol following bolus administration of intravenous contrast. CONTRAST:  1054mOMNIPAQUE IOHEXOL 300 MG/ML  SOLN COMPARISON:  10/14/2017 FINDINGS: Lower chest: Unremarkable. Hepatobiliary: Mild diffuse low density of the liver without significant change. Normal appearing gallbladder. Pancreas: Unremarkable. No pancreatic ductal dilatation or surrounding inflammatory changes. Spleen: Multiple calcified granulomata. Adrenals/Urinary Tract: Normal appearing adrenal glands. Multiple bilateral renal cysts. 1.2 cm exophytic mass arising from the upper pole of the left kidney measuring 54 Hounsfield units in density on the initial images and on the delayed images. No urinary tract calculi or hydronephrosis. Stomach/Bowel: Interval mass-like dilatation of a segment of proximal jejunum containing a mass-like conglomeration of contents and fluid without wall thickening. There is adjacent mesenteric fluid and there is some pinching and swirling of the mesentery medial to this focally dilated  loop with an appearance suggesting an internal hernia. There is mild wall thickening involving the jejunum exiting this area focal dilatation with adjacent mesenteric fluid. Multiple colonic diverticula with wall thickening and pericolonic stranding and soft tissue density involving the proximal to mid sigmoid colon. The soft tissue density area adjacent to the sigmoid colon is irregular and contains several tiny locules of gas or air. This area measures 5.4 x 4.6 x 3.4 cm.  This was not previously present. Status post  partial right inferior colectomy with staple lines. Vascular/Lymphatic: Atheromatous arterial calcifications without aneurysm. No enlarged lymph nodes. Reproductive: Prostate is unremarkable. Other: Small to moderate amount of free peritoneal fluid, including mesenteric fluid and pericolonic fluid. Musculoskeletal: Lumbar and lower thoracic spine degenerative changes. IMPRESSION: 1. 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 2. Mild wall thickening involving the jejunum distal to the focally dilated loop of jejunum in the left mid abdomen. This could be due ischemic changes associated with the internal hernia or active Crohn's disease. 3. Findings suspicious for chronic and acute diverticulitis involving the proximal to mid sigmoid colon with associated interval irregular adjacent soft tissue mass measuring 5.4 x 4.6 x 3.4 cm. The mass could represent an area of chronic inflammation and scarring or malignancy. 4. Small to moderate amount of free peritoneal fluid. 5. 1.2 cm medium density exophytic mass arising from the upper pole of the left kidney. This most likely represents a complicated cyst. Malignancy is less likely but not excluded. 6. Mild diffuse hepatic steatosis. Critical Value/emergent results were called by telephone at the time of interpretation on 04/08/2020 at 8:27 am to Dr. Cherylann Banas, who verbally acknowledged these results.  Electronically Signed   By: Claudie Revering M.D.   On: 04/09/2020 08:25   DG Chest Port 1 View  Result Date: 05/06/2020 CLINICAL DATA:  Intubated EXAM: PORTABLE CHEST 1 VIEW COMPARISON:  05/06/2020, 05/05/2020 FINDINGS: Interval intubation, tip of the endotracheal tube is 4.3 cm superior to carina. Right IJ central venous catheter tip over the SVC. Esophageal tube tip over the proximal stomach. Small left-sided pleural effusion. Dense basilar consolidation on the left with adjacent ground-glass opacity in the left lung. Right perihilar and basilar mild airspace infiltrates are unchanged. Stable cardiomediastinal silhouette. No pneumothorax. IMPRESSION: 1. Interval intubation with tip of the endotracheal tube 4.3 cm superior to carina. Esophageal tube tip over the proximal stomach. 2. No significant interval change in bilateral airspace disease, left greater than right, and small left pleural effusion since radiograph earlier today but overall worsening as compared with exams several days prior. Electronically Signed   By: Donavan Foil M.D.   On: 05/06/2020 19:39   DG Chest Port 1 View  Result Date: 05/06/2020 CLINICAL DATA:  Lethargy and shortness of breath. EXAM: PORTABLE CHEST 1 VIEW COMPARISON:  05/05/2020 FINDINGS: Enteric tube and right central venous catheter are unchanged in position. Shallow inspiration. Heart size and pulmonary vascularity are normal for technique. Patchy airspace disease in the left mid lung and both lung bases. This is progressing since the previous study. No pneumothorax. IMPRESSION: Patchy airspace disease in the left mid lung and both lung bases progressing since previous study. Electronically Signed   By: Lucienne Capers M.D.   On: 05/06/2020 16:09   DG CHEST PORT 1 VIEW  Result Date: 05/06/2020 CLINICAL DATA:  Tachypnea EXAM: PORTABLE CHEST 1 VIEW COMPARISON:  05/05/2020 FINDINGS: There is progressive left basilar consolidation. Tiny left pleural effusion may be present.  Right lung is clear. Nasogastric tube extends into the upper abdomen beyond the margin of the examination. Right internal jugular central venous catheter is unchanged with its tip at this superior vena cava. Cardiac size within normal limits. No acute bone abnormality. IMPRESSION: Progressive left basilar consolidation. Possible tiny associated pleural effusion. Electronically Signed   By: Fidela Salisbury MD   On: 05/06/2020 02:29   DG CHEST PORT 1 VIEW  Result Date: 05/05/2020 CLINICAL DATA:  Enteric tube placement EXAM:  PORTABLE CHEST 1 VIEW COMPARISON:  Chest radiograph from one day prior. FINDINGS: Right internal jugular central venous catheter terminates in the middle third of the SVC. Enteric tube terminates in the proximal stomach. Stable cardiomediastinal silhouette with normal heart size. No pneumothorax. Stable small left pleural effusion. No right pleural effusion. No pulmonary edema. Increased patchy left retrocardiac consolidation. IMPRESSION: 1. Enteric tube terminates in the proximal stomach. 2. Stable small left pleural effusion. 3. Increased patchy left retrocardiac consolidation, either atelectasis, aspiration or pneumonia. Electronically Signed   By: Ilona Sorrel M.D.   On: 05/05/2020 06:07   DG CHEST PORT 1 VIEW  Result Date: 05/04/2020 CLINICAL DATA:  Tachycardia and shortness of breath EXAM: PORTABLE CHEST 1 VIEW COMPARISON:  04/27/2018 FINDINGS: A right central venous catheter has been placed with tip over the mid SVC region. No pneumothorax. An enteric tube is present with tip projected at or just below the expected location of the EG junction. Proximal side hole is projected in the lower esophagus region. Advancement is suggested. Mild cardiac enlargement. Small left pleural effusion with infiltration or atelectasis in the left lung base. Surgical clips at the EG junction region. IMPRESSION: Enteric tube tip is at or just below the expected location of the EG junction. Advancement is  suggested. Small left pleural effusion with infiltration or atelectasis in the left lung base. Electronically Signed   By: Lucienne Capers M.D.   On: 05/04/2020 22:29   CT BONE MARROW BIOPSY & ASPIRATION  Result Date: 04/23/2020 INDICATION: 65 year old with leukocytosis and monocytosis. EXAM: CT GUIDED BONE MARROW ASPIRATES AND BIOPSY Physician: Stephan Minister. Anselm Pancoast, MD MEDICATIONS: None. ANESTHESIA/SEDATION: Fentanyl 100 mcg IV; Versed 2.0 mg IV Moderate Sedation Time:  9 minutes The patient was continuously monitored during the procedure by the interventional radiology nurse under my direct supervision. COMPLICATIONS: None immediate. PROCEDURE: The procedure was explained to the patient. The risks and benefits of the procedure were discussed and the patient's questions were addressed. Informed consent was obtained from the patient. The patient was placed prone on CT table. Images of the pelvis were obtained. The right side of back was prepped and draped in sterile fashion. The skin and right posterior ilium were anesthetized with 1% lidocaine. 11 gauge bone needle was directed into the right ilium with CT guidance. Two aspirates and one core biopsy were obtained. Bandage placed over the puncture site. IMPRESSION: CT guided bone marrow aspiration and core biopsy. Electronically Signed   By: Markus Daft M.D.   On: 04/23/2020 12:04   ECHOCARDIOGRAM COMPLETE  Result Date: 05/06/2020    ECHOCARDIOGRAM REPORT   Patient Name:   Brent Diaz Date of Exam: 05/05/2020 Medical Rec #:  [id number]      Height:       70.0 in Accession #:    [id number]     Weight:       165.1 lb Date of Birth:  1955-12-21      BSA:          1.924 m Patient Age:    18 years       BP:           118/61 mmHg Patient Gender: M              HR:           113 bpm. Exam Location:  [facility] Procedure: 2D Echo Indications:     Elevated Troponin  History:         Patient has no  prior history of Echocardiogram examinations.  Sonographer:     Arville Go RDCS  Referring Phys:  [place] Diagnosing Phys: Ida Rogue MD  Sonographer Comments: Technically difficult study due to poor echo windows and no subcostal window. IMPRESSIONS  1. Left ventricular ejection fraction, by estimation, is 55 to 60%. The left ventricle has normal function. The left ventricle has no regional wall motion abnormalities. Left ventricular diastolic parameters are indeterminate.  2. Right ventricular systolic function is normal. The right ventricular size is normal. Tricuspid regurgitation signal is inadequate for assessing PA pressure.  3. Challenging images. FINDINGS  Left Ventricle: Left ventricular ejection fraction, by estimation, is 55 to 60%. The left ventricle has normal function. The left ventricle has no regional wall motion abnormalities. The left ventricular internal cavity size was normal in size. There is  no left ventricular hypertrophy. Left ventricular diastolic parameters are indeterminate. Right Ventricle: The right ventricular size is normal. No increase in right ventricular wall thickness. Right ventricular systolic function is normal. Tricuspid regurgitation signal is inadequate for assessing PA pressure. Left Atrium: Left atrial size was normal in size. Right Atrium: Right atrial size was normal in size. Pericardium: There is no evidence of pericardial effusion. Mitral Valve: The mitral valve is normal in structure. No evidence of mitral valve regurgitation. No evidence of mitral valve stenosis. Tricuspid Valve: The tricuspid valve is normal in structure. Tricuspid valve regurgitation is mild . No evidence of tricuspid stenosis. Aortic Valve: The aortic valve is normal in structure. Aortic valve regurgitation is not visualized. No aortic stenosis is present. Aortic valve peak gradient measures 7.6 mmHg. Pulmonic Valve: The pulmonic valve was normal in structure. Pulmonic valve regurgitation is not visualized. No evidence of pulmonic stenosis. Aorta: The aortic  root is normal in size and structure. Venous: The inferior vena cava is normal in size with greater than 50% respiratory variability, suggesting right atrial pressure of 3 mmHg. IAS/Shunts: No atrial level shunt detected by color flow Doppler.  LEFT VENTRICLE PLAX 2D LVIDd:         3.67 cm     Diastology LVIDs:         2.46 cm     LV e' medial:    7.72 cm/s LV PW:         0.92 cm     LV E/e' medial:  8.7 LV IVS:        1.24 cm     LV e' lateral:   7.83 cm/s LVOT diam:     1.90 cm     LV E/e' lateral: 8.5 LVOT Area:     2.84 cm  LV Volumes (MOD) LV vol d, MOD A4C: 21.4 ml LV SV MOD A4C:     21.4 ml LEFT ATRIUM           Index      RIGHT ATRIUM           Index LA diam:      1.80 cm 0.94 cm/m RA Area:     14.40 cm LA Vol (A4C): 18.2 ml 9.46 ml/m RA Volume:   37.40 ml  19.44 ml/m  AORTIC VALVE              PULMONIC VALVE AV Vmax:      138.00 cm/s PV Vmax:       1.45 m/s AV Peak Grad: 7.6 mmHg    PV Peak grad:  8.4 mmHg  AORTA Ao Root diam: 3.10 cm Ao Asc  diam:  3.30 cm MITRAL VALVE MV Area (PHT): 5.66 cm    SHUNTS MV Decel Time: 134 msec    Systemic Diam: 1.90 cm MV E velocity: 66.80 cm/s MV A velocity: 85.30 cm/s MV E/A ratio:  0.78 Ida Rogue MD Electronically signed by Ida Rogue MD Signature Date/Time: 05/06/2020/1:09:47 PM    Final    [place] EKG SITE RITE  Result Date: 05/06/2020 If Site Rite image not attached, placement could not be confirmed due to current cardiac rhythm.  [place] EKG SITE RITE  Result Date: 05/04/2020 If Site Rite image not attached, placement could not be confirmed due to current cardiac rhythm.   Microbiology Recent Results (from the past 240 hour(s))  Resp Panel by RT-PCR (Flu A&B, Covid) Nasopharyngeal Swab     Status: None   Collection Time: 04/06/2020 10:40 AM   Specimen: Nasopharyngeal Swab; Nasopharyngeal(NP) swabs in vial transport medium  Result Value Ref Range Status   SARS Coronavirus 2 by RT PCR NEGATIVE NEGATIVE Final    Comment: (NOTE) SARS-CoV-2 target  nucleic acids are NOT DETECTED.  The SARS-CoV-2 RNA is generally detectable in upper respiratory specimens during the acute phase of infection. The lowest concentration of SARS-CoV-2 viral copies this assay can detect is 138 copies/mL. A negative result does not preclude SARS-Cov-2 infection and should not be used as the sole basis for treatment or other patient management decisions. A negative result may occur with  improper specimen collection/handling, submission of specimen other than nasopharyngeal swab, presence of viral mutation(s) within the areas targeted by this assay, and inadequate number of viral copies(<138 copies/mL). A negative result must be combined with clinical observations, patient history, and epidemiological information. The expected result is Negative.  Fact Sheet for Patients:  EntrepreneurPulse.com.au  Fact Sheet for Healthcare Providers:  IncredibleEmployment.be  This test is no t yet approved or cleared by the [place] FDA and  has been authorized for detection and/or diagnosis of SARS-CoV-2 by FDA under an Emergency Use Authorization (EUA). This EUA will remain  in effect (meaning this test can be used) for the duration of the COVID-19 declaration under Section 564(b)(1) of the Act, 21 U.S.C.section 360bbb-3(b)(1), unless the authorization is terminated  or revoked sooner.       Influenza A by PCR NEGATIVE NEGATIVE Final   Influenza B by PCR NEGATIVE NEGATIVE Final    Comment: (NOTE) The Xpert Xpress SARS-CoV-2/FLU/RSV plus assay is intended as an aid in the diagnosis of influenza from Nasopharyngeal swab specimens and should not be used as a sole basis for treatment. Nasal washings and aspirates are unacceptable for Xpert Xpress SARS-CoV-2/FLU/RSV testing.  Fact Sheet for Patients: EntrepreneurPulse.com.au  Fact Sheet for Healthcare  Providers: IncredibleEmployment.be  This test is not yet approved or cleared by the [place] FDA and has been authorized for detection and/or diagnosis of SARS-CoV-2 by FDA under an Emergency Use Authorization (EUA). This EUA will remain in effect (meaning this test can be used) for the duration of the COVID-19 declaration under Section 564(b)(1) of the Act, 21 U.S.C. section 360bbb-3(b)(1), unless the authorization is terminated or revoked.  Performed at [facility], [address] [place], [place] [address]   CULTURE, BLOOD (ROUTINE X 2) w Reflex to ID Panel     Status: None   Collection Time: 05/04/20 11:27 PM   Specimen: BLOOD  Result Value Ref Range Status   Specimen Description BLOOD BLOOD RIGHT FOREARM  Final   Special Requests   Final  BOTTLES DRAWN AEROBIC AND ANAEROBIC Blood Culture adequate volume   Culture   Final    NO GROWTH 5 DAYS Performed at [facility], [address] [place], [place] [address]    Report Status 2020/05/26 FINAL  Final  CULTURE, BLOOD (ROUTINE X 2) w Reflex to ID Panel     Status: None (Preliminary result)   Collection Time: 05/05/20  6:29 AM   Specimen: BLOOD  Result Value Ref Range Status   Specimen Description BLOOD RIGHT [facility]  Final   Special Requests   Final    BOTTLES DRAWN AEROBIC AND ANAEROBIC Blood Culture adequate volume   Culture   Final    NO GROWTH 4 DAYS Performed at [facility], [address] [place], [place] [address]    Report Status PENDING  Incomplete  MRSA PCR Screening     Status: Abnormal   Collection Time: 05/06/20  5:21 PM   Specimen: Nasal Mucosa; Nasopharyngeal  Result Value Ref Range Status   MRSA by PCR POSITIVE (A) NEGATIVE Final    Comment:        The GeneXpert MRSA Assay (FDA approved for NASAL specimens only), is one component of a comprehensive MRSA colonization surveillance program. It is not intended to diagnose MRSA infection nor to guide  or monitor treatment for MRSA infections. RESULT CALLED TO, READ BACK BY AND VERIFIED WITH: EMILY GANNON 05/06/20 AT 1912 BY ACR Performed at [facility], [address] [place], [place] [address]   Culture, respiratory (non-expectorated)     Status: None (Preliminary result)   Collection Time: 05/06/20  7:37 PM   Specimen: Tracheal Aspirate; Respiratory  Result Value Ref Range Status   Specimen Description   Final    TRACHEAL ASPIRATE Performed at [facility], [address] [place], [place] [address]    Special Requests   Final    NONE Performed at [facility], [address] [place], [place] [address]    Gram Stain   Final    ABUNDANT WBC PRESENT, PREDOMINANTLY PMN MODERATE GRAM POSITIVE COCCI    Culture   Final    ABUNDANT STAPHYLOCOCCUS AUREUS SUSCEPTIBILITIES TO FOLLOW Performed at [facility], [place] [address] [place], [place] [address]    Report Status PENDING  Incomplete    Lab Basic Metabolic Panel: Recent Labs  Lab 05/05/20 0225 05/05/20 0102 05/06/20 0302 05/06/20 1636 05/07/20 0409 05/08/20 0448 05/08/20 0620 05/08/20 1014 05/08/20 1803 05/26/2020 0000 May 26, 2020 0500  NA 143 143 146* 143 142 141  --   --   --   --  138  K 3.5 4.0 3.3* 3.8 3.5 5.4* 5.7* 6.2* 7.0* 6.5* 6.0*  CL 115* 111 113* 115* 113* 113*  --   --   --   --  109  CO2 21* 22 22 22 24 23   --   --   --   --  21*  GLUCOSE 132* 117* 150* 136* 169* 162*  --   --   --   --  367*  BUN 17 16 14 18 17  30*  --   --   --   --  46*  CREATININE 0.87 0.88 0.81 0.80 0.68 1.47*  --   --   --   --  3.04*  CALCIUM 8.0* 7.8* 7.7* 8.1* 7.5* 7.8*  --   --   --   --  8.4*  MG 1.9  --  1.9  --  1.9 2.1  --   --   --   --  2.3  PHOS 2.8 2.5  --   --  3.4 4.0  --   --   --   --  6.8*   Liver Function Tests: Recent Labs  Lab 05/05/20 0225 05/05/20 0629 05/06/20 0302 05/06/20 1636 05/07/20 0409 05/08/20 0448 05/08/20 1217 01-Jun-2020 0500  AST 14*  --  19 16 13*   --  17  --   ALT 11  --  11 10 10   --  9  --   ALKPHOS 38  --  40 41 39  --  52  --   BILITOT 1.0  --  0.8 1.0 1.0  --  1.1  --   PROT 4.4*  --  4.2* 4.3* 4.3*  --  3.9*  --   ALBUMIN 2.0*   < > 1.7* 2.0* 2.1* 1.5* 1.5* 1.2*   < > = values in this interval not displayed.   Recent Labs  Lab 05/02/2020 0426  LIPASE 20   No results for input(s): AMMONIA in the last 168 hours. CBC: Recent Labs  Lab 05/05/20 0225 05/06/20 0302 05/06/20 1636 05/07/20 0409 05/08/20 0448 2020/06/01 0500  WBC 28.8* 20.0* 20.8* 23.2* 31.8* 34.6*  NEUTROABS 24.4*  --  17.2* 18.2* 23.7* 27.8*  HGB 8.6* 7.9* 7.8* 7.4* 8.1* 6.6*  HCT 27.4* 25.3* 24.4* 24.1* 27.5* 22.7*  MCV 99.3 99.6 97.2 101.3* 104.6* 106.6*  PLT 185 161 185 177 191 171   Cardiac Enzymes: No results for input(s): CKTOTAL, CKMB, CKMBINDEX, TROPONINI in the last 168 hours. Sepsis Labs: Recent Labs  Lab 05/04/20 2310 05/05/20 0225 05/05/20 0235 05/05/20 1207 05/06/20 0302 05/06/20 1636 05/06/20 1956 05/07/20 0409 05/08/20 0448 Jun 01, 2020 0500  PROCALCITON 0.51 0.46  --   --  1.04  --   --   --   --   --   WBC  --  28.8*  --   --  20.0* 20.8*  --  23.2* 31.8* 34.6*  LATICACIDVEN 6.8*  --    < > 4.2*  --  2.4* 2.1* 1.9  --   --    < > = values in this interval not displayed.    Flora Lipps June 01, 2020, 11:16 AM

## 2020-06-05 DEATH — deceased

## 2020-06-26 ENCOUNTER — Ambulatory Visit: Payer: Medicare PPO | Admitting: Hematology and Oncology

## 2020-06-26 ENCOUNTER — Other Ambulatory Visit: Payer: Medicare PPO

## 2022-06-21 IMAGING — CR DG ANKLE COMPLETE 3+V*R*
3 series · 3 of 3 positions shown · non-contrast
Comparison: None.

CLINICAL DATA: Contusion of right ankle, initial encounter. Fall.
Right ankle pain.

EXAM:
RIGHT ANKLE - COMPLETE 3+ VIEW

[ankle obl]
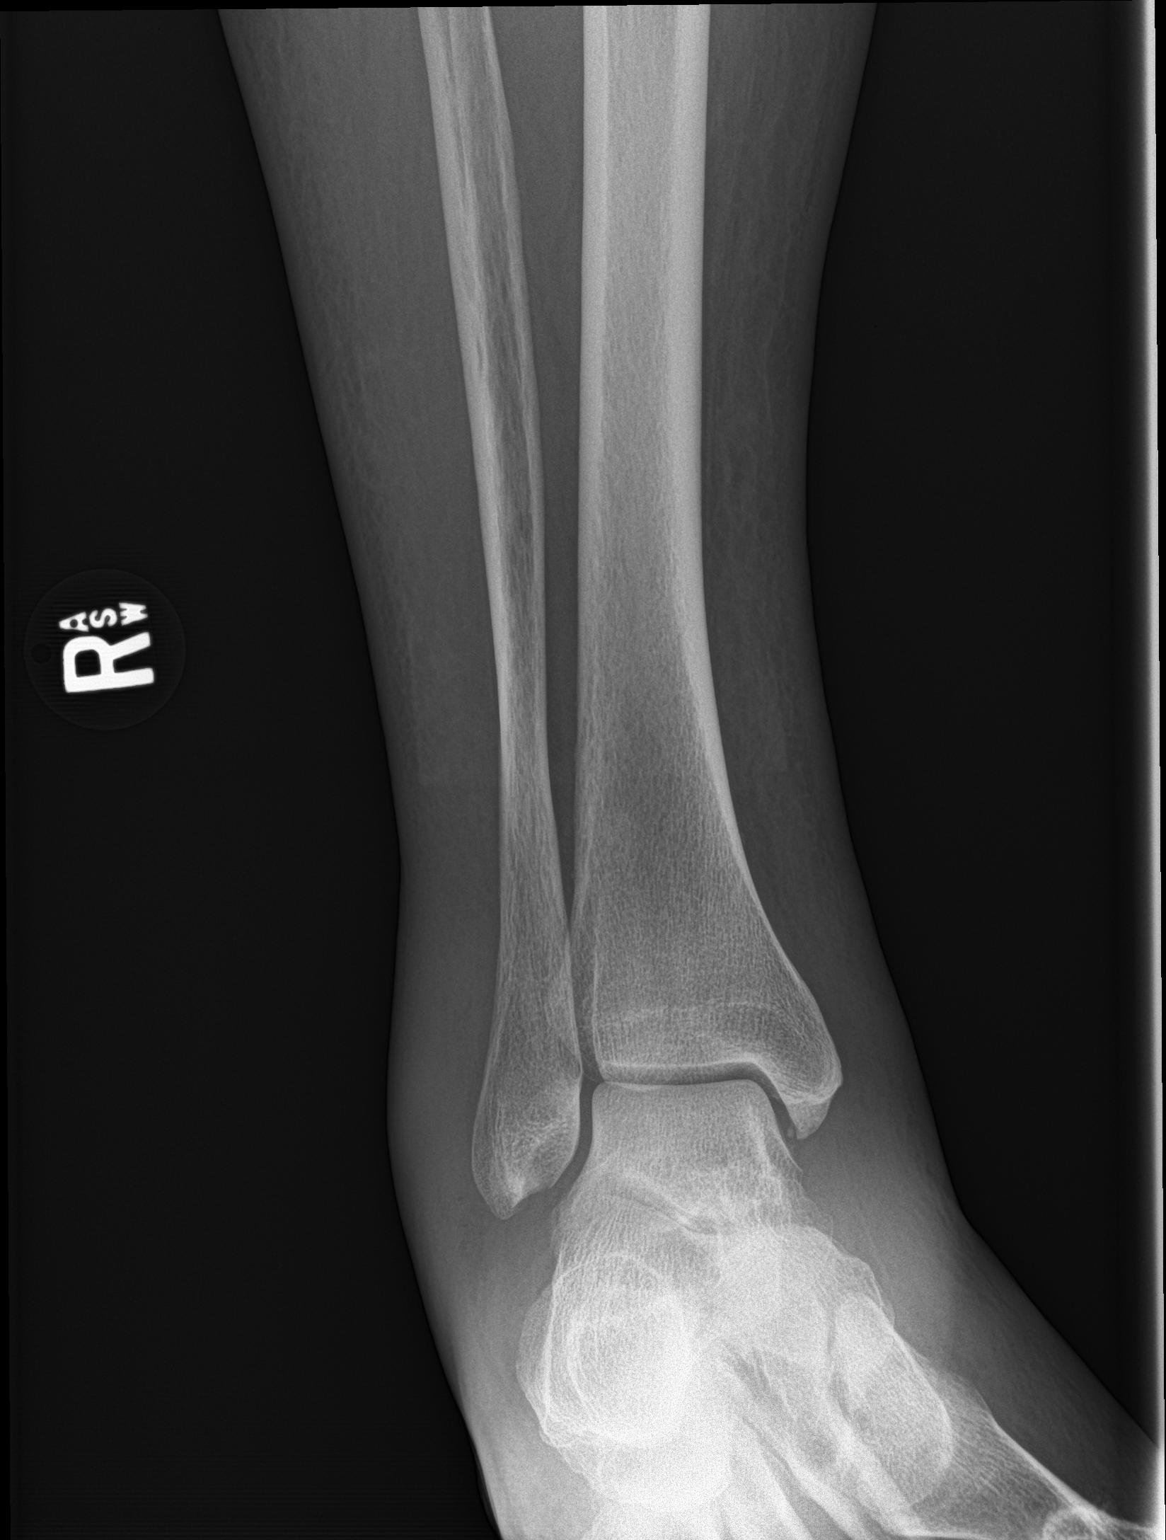

[ankle lat]
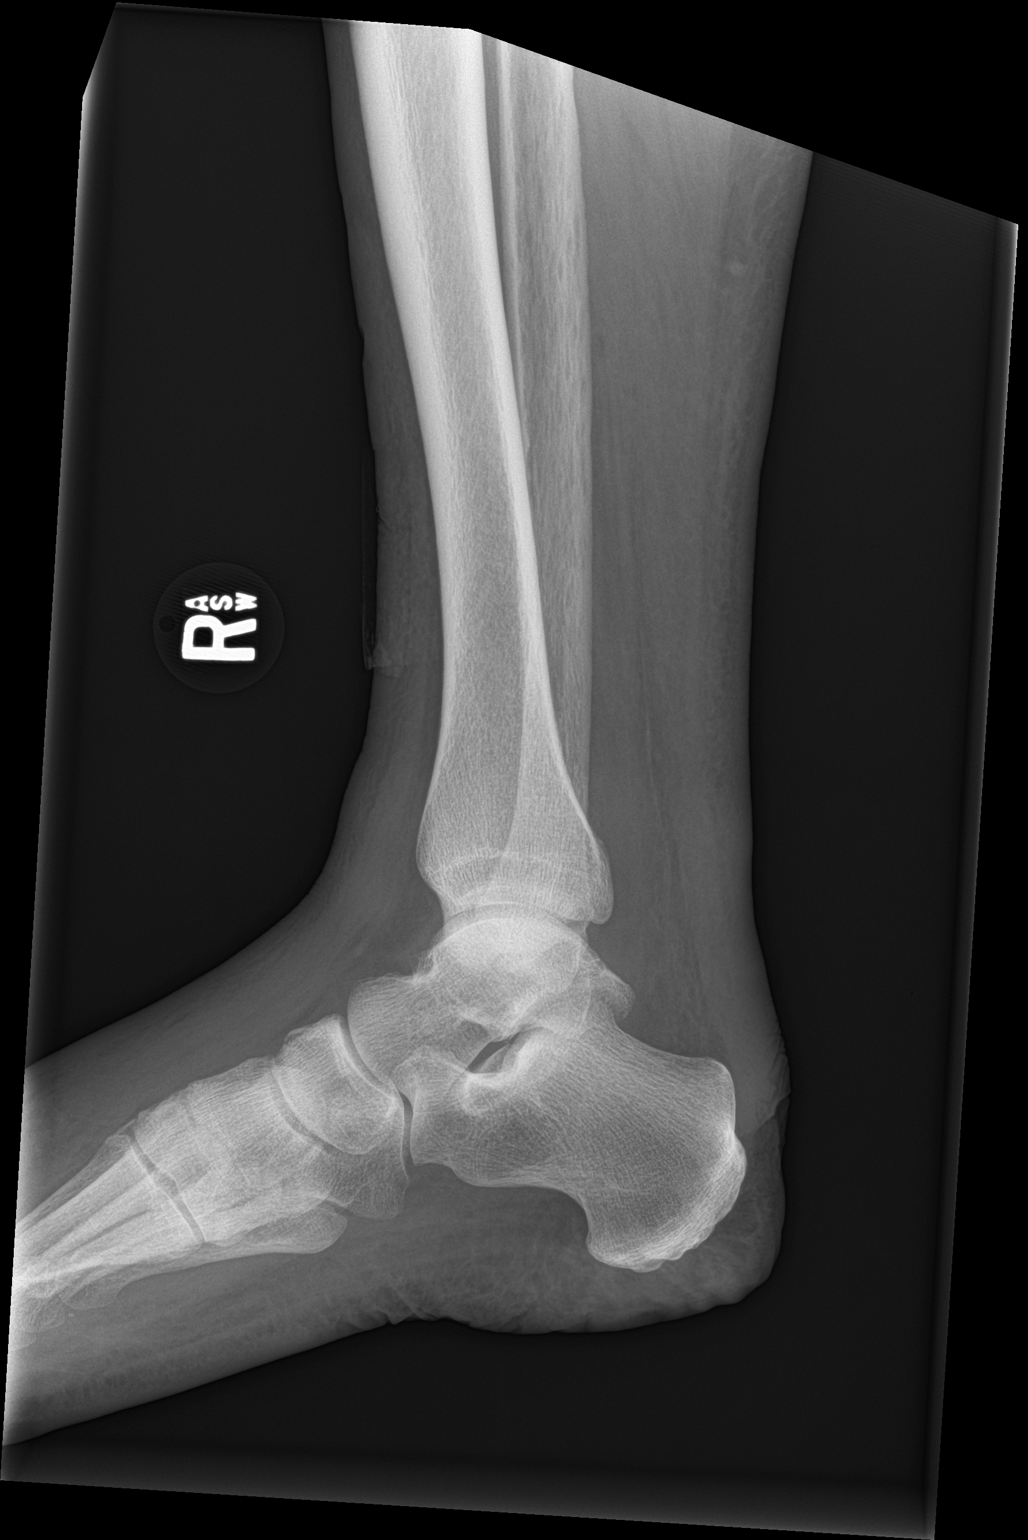

[ankle ap]
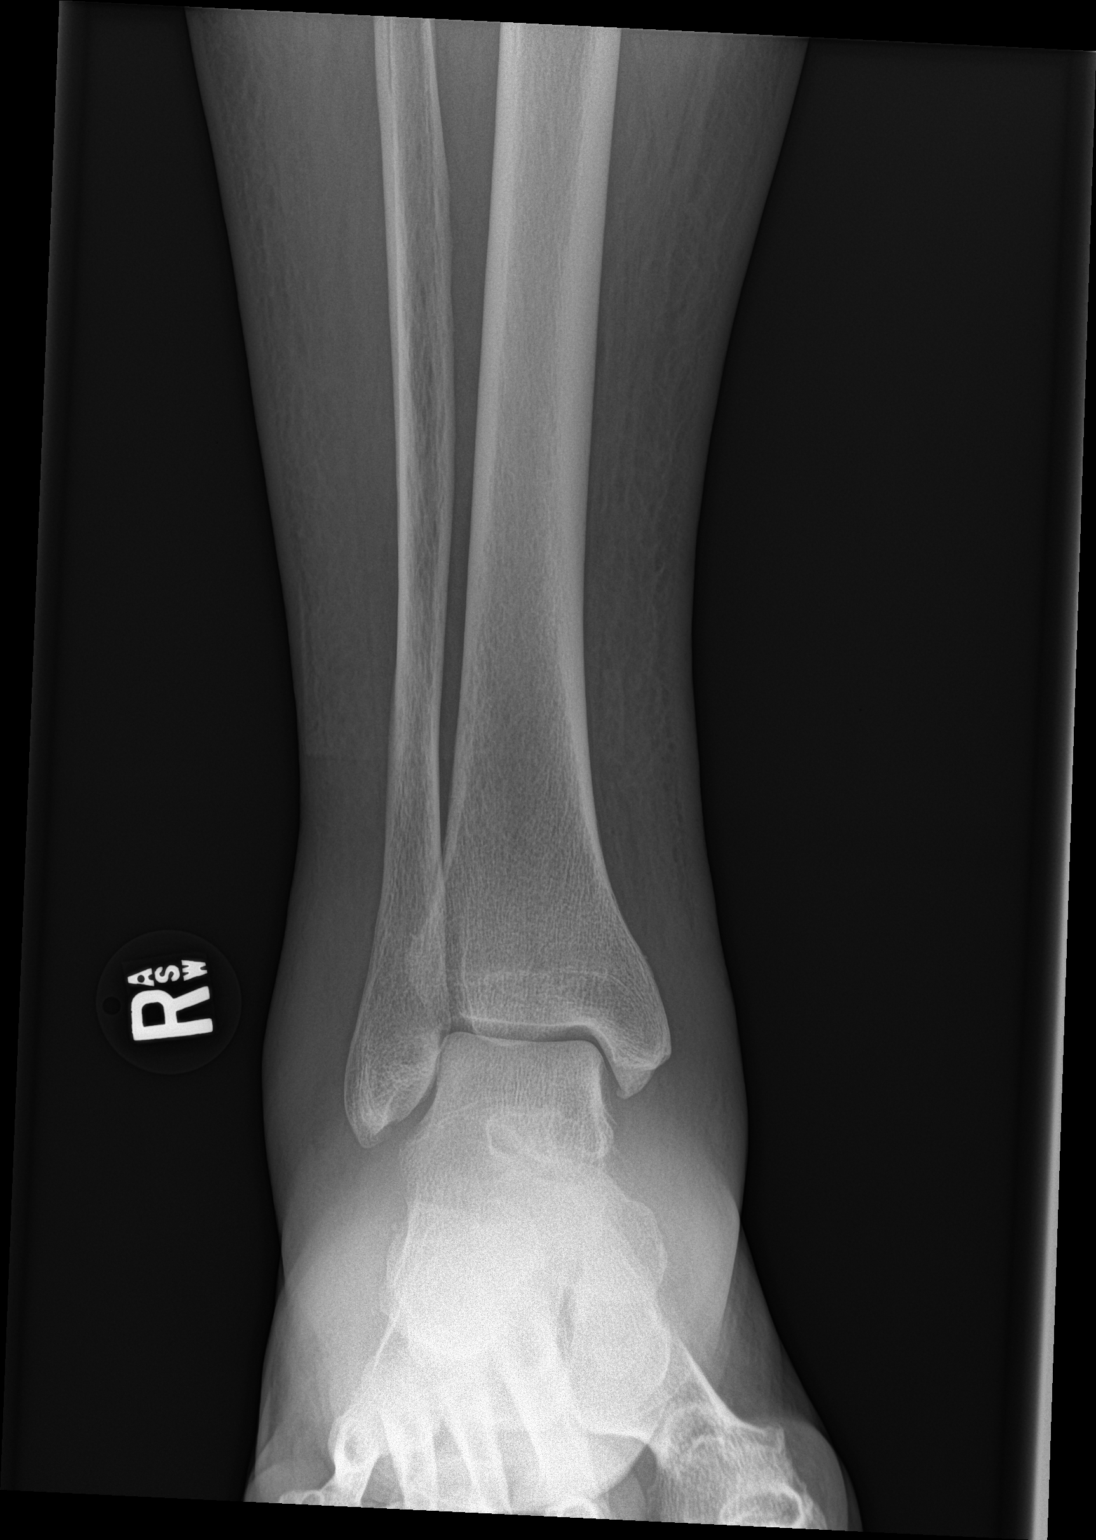

[3 of 3 positions shown; findings below may reference images not displayed]

FINDINGS: There is no evidence of fracture, dislocation, or joint effusion.
The alignment and ankle mortise are preserved. There is diffuse in
generalized circumferential soft tissue edema.
IMPRESSION: Soft tissue edema without acute osseous abnormality.

## 2022-09-26 NOTE — Telephone Encounter (Signed)
Signing note, See previous encounter on 05/07/20
